# Patient Record
Sex: Male | Born: 1940 | Race: White | Hispanic: No | Marital: Married | Smoking: Never smoker
Health system: Southern US, Community
[De-identification: ages and names within clinical notes are randomized; demographics above are authoritative.]

## PROBLEM LIST (undated history)

## (undated) DIAGNOSIS — I10 Essential (primary) hypertension: Secondary | ICD-10-CM

## (undated) DIAGNOSIS — F329 Major depressive disorder, single episode, unspecified: Secondary | ICD-10-CM

## (undated) DIAGNOSIS — F32A Depression, unspecified: Secondary | ICD-10-CM

## (undated) DIAGNOSIS — K5903 Drug induced constipation: Secondary | ICD-10-CM

## (undated) DIAGNOSIS — G5600 Carpal tunnel syndrome, unspecified upper limb: Secondary | ICD-10-CM

## (undated) DIAGNOSIS — R51 Headache: Secondary | ICD-10-CM

## (undated) DIAGNOSIS — I712 Thoracic aortic aneurysm, without rupture: Secondary | ICD-10-CM

## (undated) DIAGNOSIS — M1990 Unspecified osteoarthritis, unspecified site: Secondary | ICD-10-CM

## (undated) DIAGNOSIS — M5416 Radiculopathy, lumbar region: Secondary | ICD-10-CM

## (undated) DIAGNOSIS — Z86711 Personal history of pulmonary embolism: Secondary | ICD-10-CM

## (undated) DIAGNOSIS — F419 Anxiety disorder, unspecified: Secondary | ICD-10-CM

## (undated) DIAGNOSIS — N400 Enlarged prostate without lower urinary tract symptoms: Secondary | ICD-10-CM

## (undated) DIAGNOSIS — Z9289 Personal history of other medical treatment: Secondary | ICD-10-CM

## (undated) DIAGNOSIS — B1910 Unspecified viral hepatitis B without hepatic coma: Secondary | ICD-10-CM

## (undated) DIAGNOSIS — R350 Frequency of micturition: Secondary | ICD-10-CM

## (undated) DIAGNOSIS — R0902 Hypoxemia: Secondary | ICD-10-CM

## (undated) DIAGNOSIS — K219 Gastro-esophageal reflux disease without esophagitis: Secondary | ICD-10-CM

## (undated) DIAGNOSIS — Z87442 Personal history of urinary calculi: Secondary | ICD-10-CM

## (undated) DIAGNOSIS — I480 Paroxysmal atrial fibrillation: Secondary | ICD-10-CM

## (undated) DIAGNOSIS — I2510 Atherosclerotic heart disease of native coronary artery without angina pectoris: Secondary | ICD-10-CM

## (undated) DIAGNOSIS — G4730 Sleep apnea, unspecified: Secondary | ICD-10-CM

## (undated) DIAGNOSIS — G8929 Other chronic pain: Secondary | ICD-10-CM

## (undated) HISTORY — PX: BACK SURGERY: SHX140

## (undated) HISTORY — PX: APPENDECTOMY: SHX54

---

## 1989-02-17 DIAGNOSIS — G4730 Sleep apnea, unspecified: Secondary | ICD-10-CM

## 1989-02-17 HISTORY — PX: JOINT REPLACEMENT: SHX530

## 1989-02-17 HISTORY — DX: Sleep apnea, unspecified: G47.30

## 2007-07-04 ENCOUNTER — Inpatient Hospital Stay (HOSPITAL_COMMUNITY): Admission: RE | Admit: 2007-07-04 | Discharge: 2007-07-09 | Payer: Self-pay | Admitting: Neurosurgery

## 2007-07-04 ENCOUNTER — Ambulatory Visit: Payer: Self-pay | Admitting: Pulmonary Disease

## 2007-07-08 ENCOUNTER — Ambulatory Visit: Payer: Self-pay | Admitting: Physical Medicine & Rehabilitation

## 2007-07-12 ENCOUNTER — Inpatient Hospital Stay (HOSPITAL_COMMUNITY): Admission: EM | Admit: 2007-07-12 | Discharge: 2007-07-17 | Payer: Self-pay | Admitting: Emergency Medicine

## 2007-07-26 ENCOUNTER — Ambulatory Visit: Payer: Self-pay | Admitting: Internal Medicine

## 2007-07-26 ENCOUNTER — Inpatient Hospital Stay (HOSPITAL_COMMUNITY): Admission: AD | Admit: 2007-07-26 | Discharge: 2007-08-14 | Payer: Self-pay | Admitting: Neurosurgery

## 2007-08-08 ENCOUNTER — Ambulatory Visit: Payer: Self-pay | Admitting: Physical Medicine & Rehabilitation

## 2007-08-14 ENCOUNTER — Inpatient Hospital Stay (HOSPITAL_COMMUNITY)
Admission: RE | Admit: 2007-08-14 | Discharge: 2007-08-23 | Payer: Self-pay | Admitting: Physical Medicine & Rehabilitation

## 2007-09-25 ENCOUNTER — Encounter: Admission: RE | Admit: 2007-09-25 | Discharge: 2007-09-25 | Payer: Self-pay | Admitting: Neurosurgery

## 2007-11-04 ENCOUNTER — Inpatient Hospital Stay (HOSPITAL_COMMUNITY): Admission: RE | Admit: 2007-11-04 | Discharge: 2007-11-07 | Payer: Self-pay | Admitting: Neurosurgery

## 2008-01-28 ENCOUNTER — Encounter: Admission: RE | Admit: 2008-01-28 | Discharge: 2008-01-28 | Payer: Self-pay | Admitting: Neurosurgery

## 2008-04-08 ENCOUNTER — Encounter: Admission: RE | Admit: 2008-04-08 | Discharge: 2008-04-08 | Payer: Self-pay | Admitting: Neurosurgery

## 2008-04-22 IMAGING — CR DG CHEST 1V PORT
1 series · 1 of 1 positions shown · non-contrast
Comparison: 08/03/07.

CLINICAL DATA: 66 year-old-male ? PICC line placement. 
 PORTABLE CHEST - 1 VIEW ? 08/23/07:

[view not recorded]
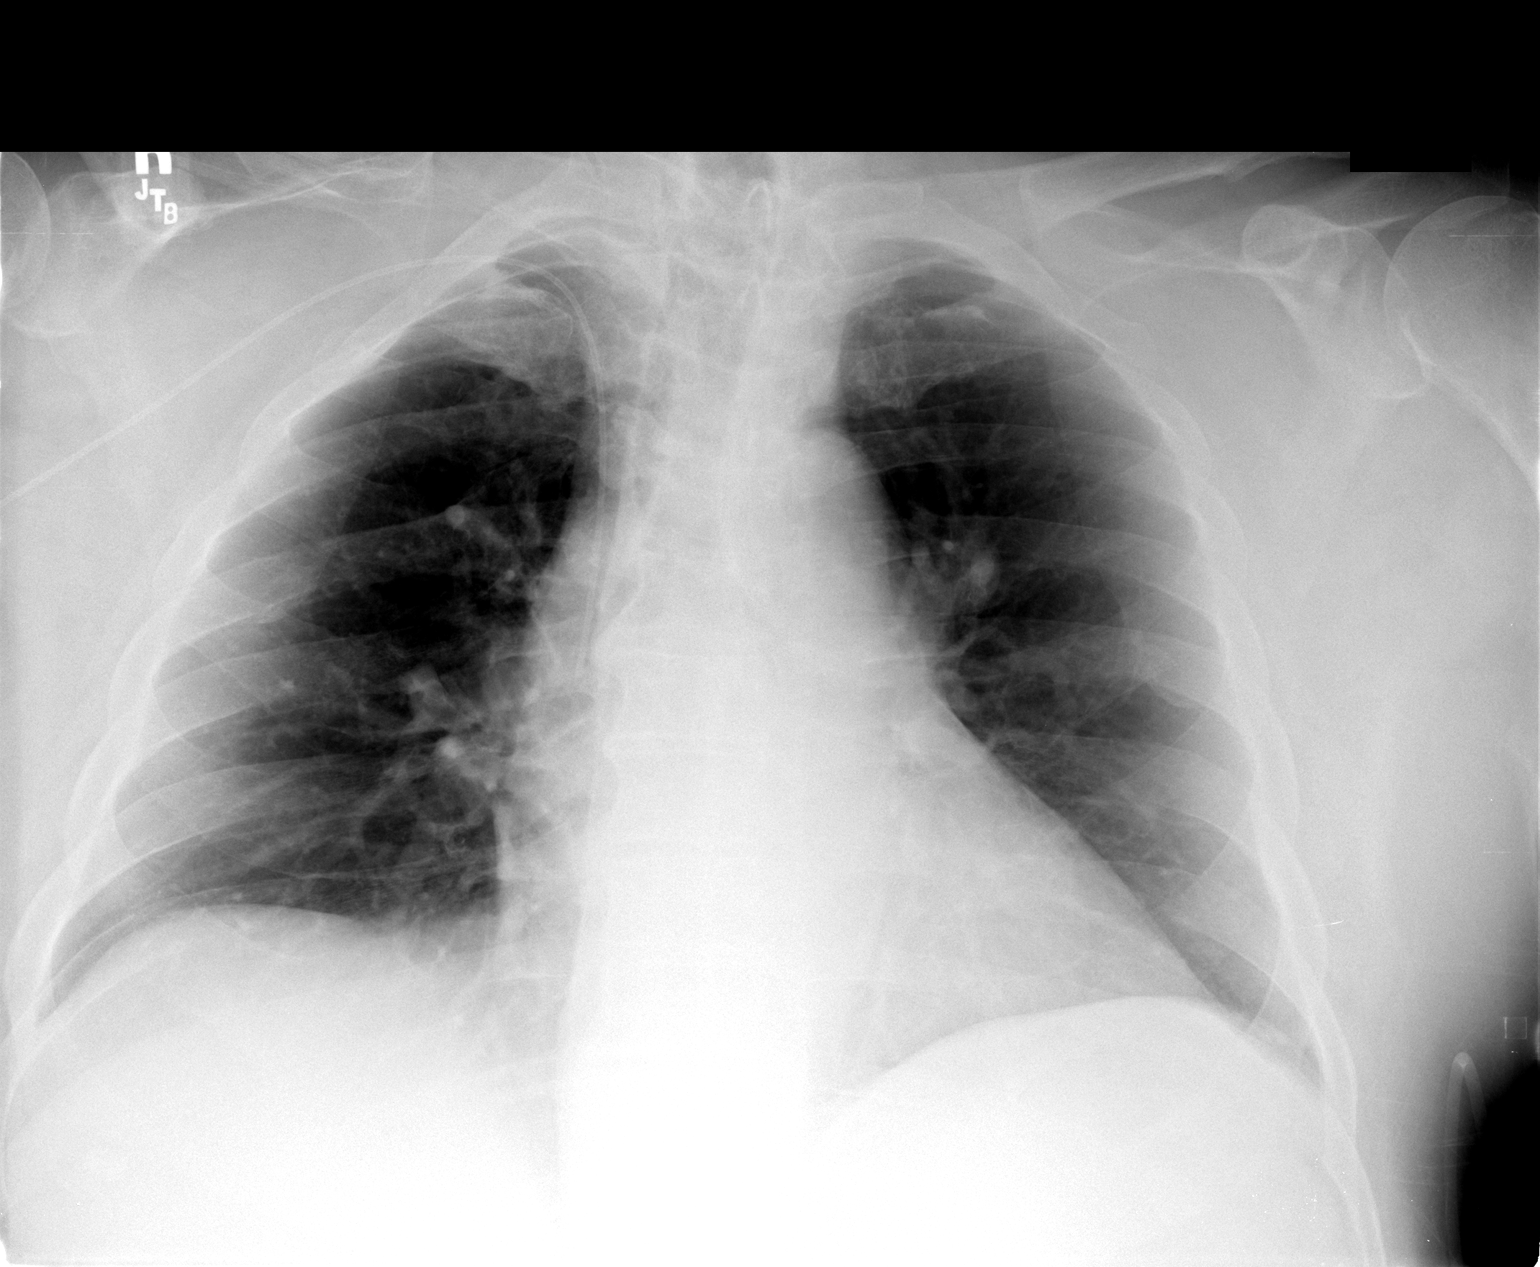

[1 of 1 positions shown; findings below may reference images not displayed]

FINDINGS: The right-sided PICC line tip is in good position in the distal SVC.  It is approximately 2.5 cm above the cavoatrial junction.  The heart is mildly enlarged.  Low lung volumes but no significant pulmonary findings.
IMPRESSION: PICC line tip is in the distal SVC approximately 2.5 cm above the region of the cavoatrial junction.

## 2008-05-25 IMAGING — CT CT L SPINE W/O CM
4 of 10 series · 12 of 33 positions shown, 14 images · non-contrast
Comparison: CT 08/05/2007 and MRI 07/29/2007.

CLINICAL DATA: Low back and bilateral leg pain.  Status post lumbar
surgery complicated by infection.

CT LUMBAR SPINE WITHOUT CONTRAST
TECHNIQUE: Multidetector CT imaging of the lumbar spine was
performed without intravenous contrast administration. Multiplanar
CT image reconstructions were also generated.

[Series 2: l spine · axial · 0.27mm/px · z∈[-276,-196]mm · 2 of 96 slices shown]
[im 32/96  bone]
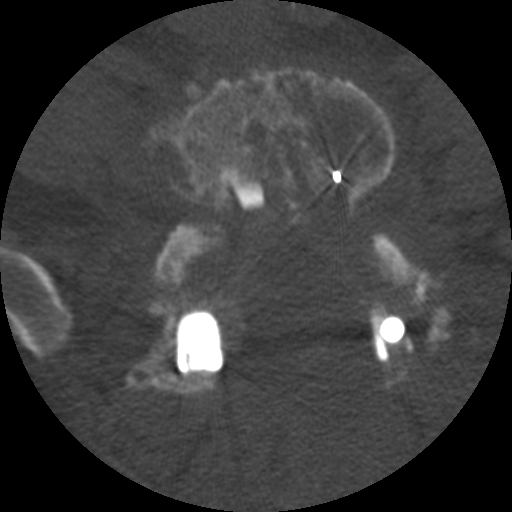
[im 64/96  bone]
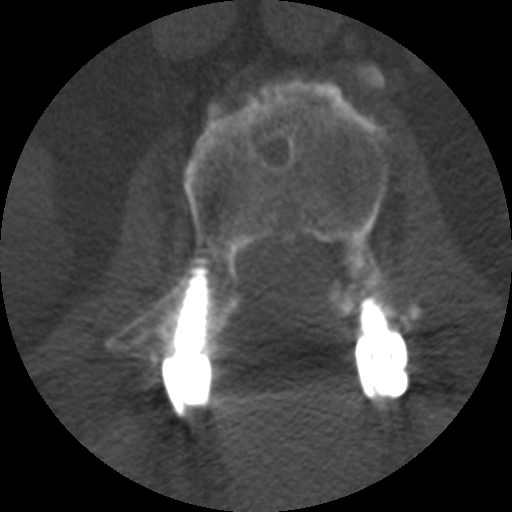

[Series 3: bone windows · axial · 0.27mm/px · z∈[-296,-176]mm · 3 of 96 slices shown, 4 images]
[im 24/96  soft-tissue]
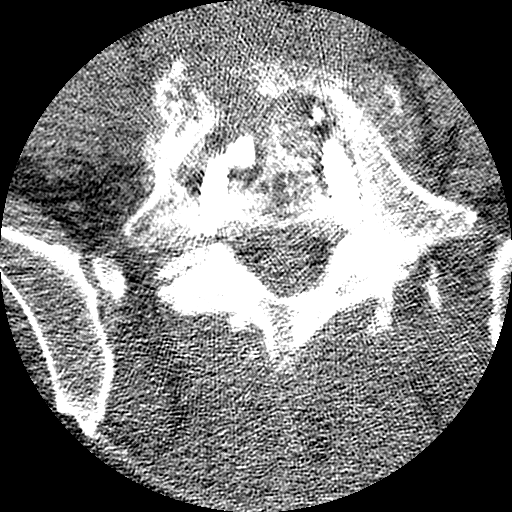
[im 24/96  bone]
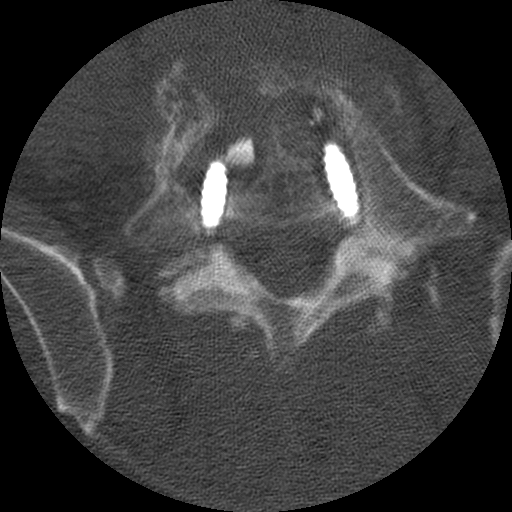
[im 48/96  bone]
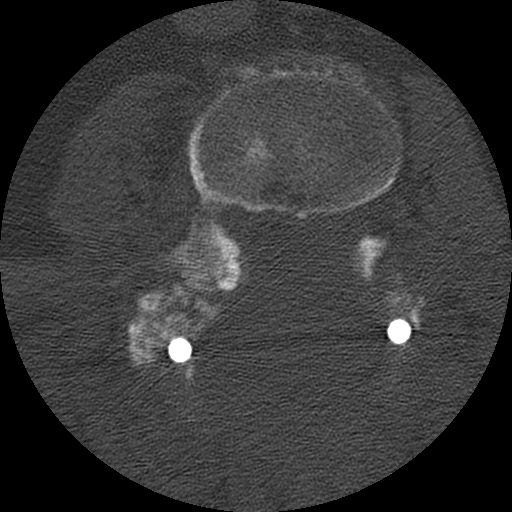
[im 72/96  bone]
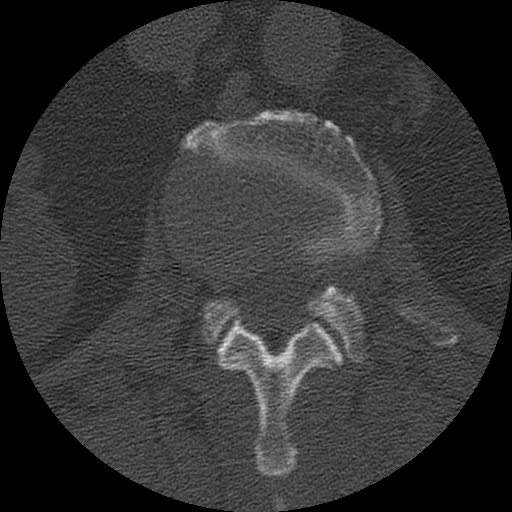

[Series 401: cor lower l-spine · coronal · 0.47mm/px · 2 of 60 slices shown]
[im 20/60  bone]
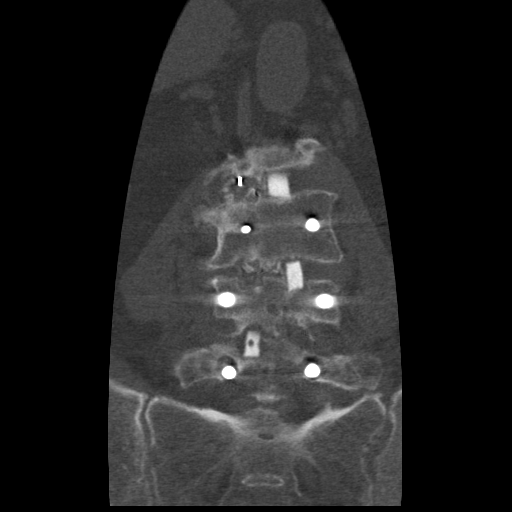
[im 40/60  bone]
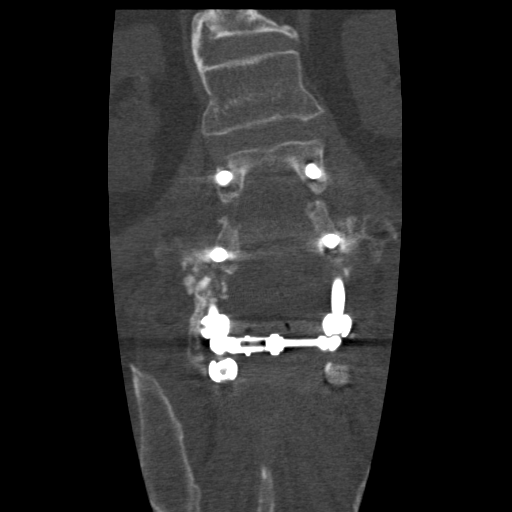

[Series 402: sag l-spine · sagittal · 0.47mm/px · 5 of 40 slices shown, 6 images]
[im 14/40  bone]
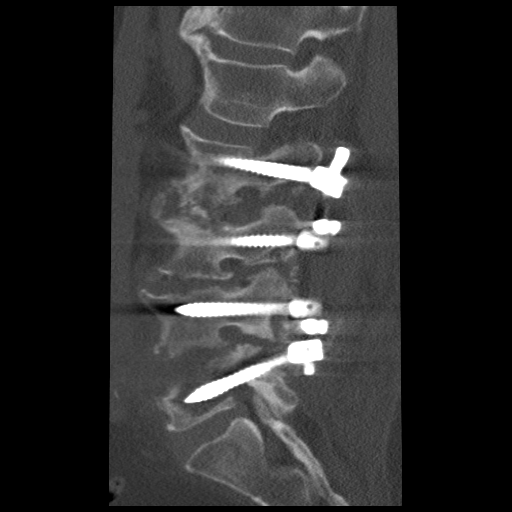
[im 17/40  bone]
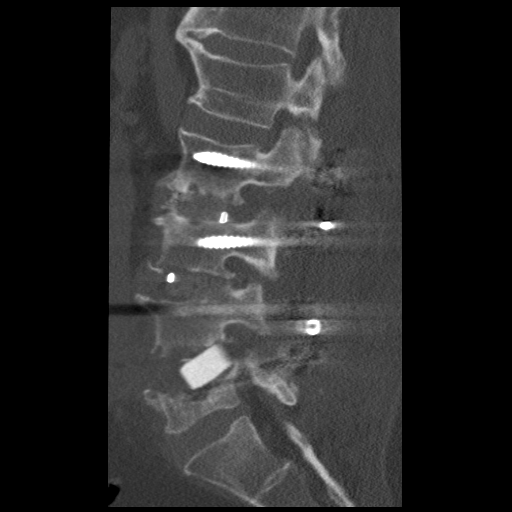
[im 20/40  soft-tissue]
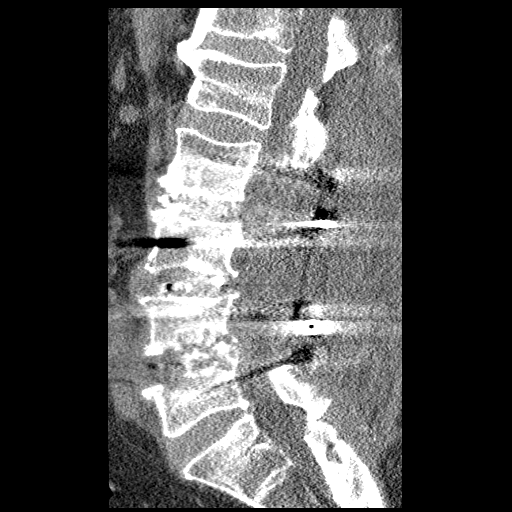
[im 20/40  bone]
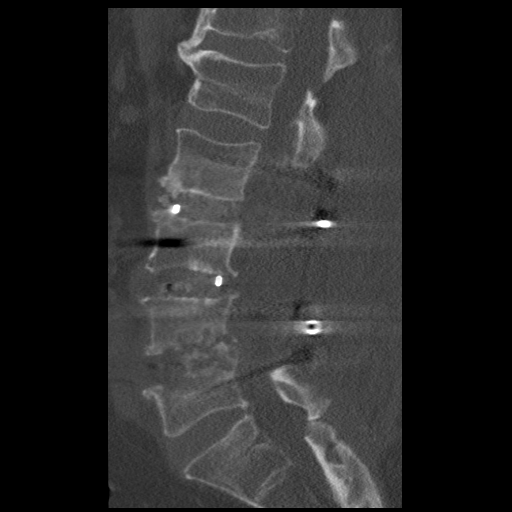
[im 23/40  bone]
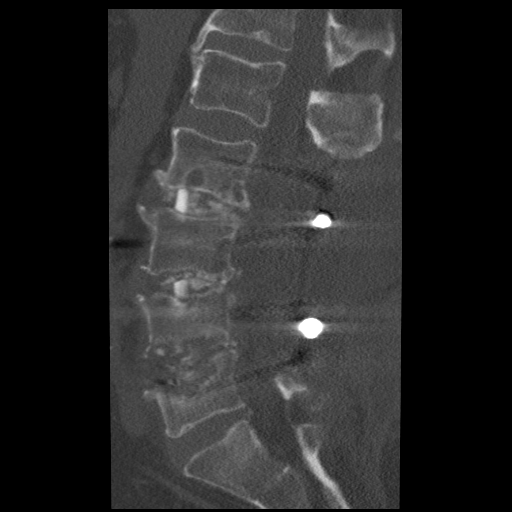
[im 27/40  bone]
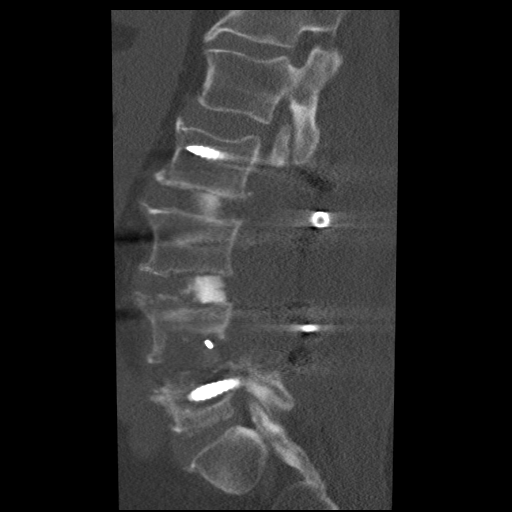

[12 of 33 positions shown; findings below may reference images not displayed]

FINDINGS: The patient is status post PLIF L2-L5.  It is difficult
to assess the extent of the posterior fluid collection on CT a
large posterior collection is still suspected.  There are locules
of air at the L4 level within the posterior collection.

There is significant progression of bone resorption and endplate
destruction at the L4-5 level.  There is subsidence of the metallic
spacer and graft material into the L5 vertebral body.  There is
marked progression of lucency surrounding the L5 pedicle screws.
There is also some increased lucency surrounding the metallic
spacer at the L3-4 level.  The remaining pedicle screws appear to
be intact.  Significant ventral soft tissue prominence is also
present at the L4-5 level and to a lesser extent at the L3-4 level.
[DATE] level is more prominent than on the prior study.

Overall alignment is stable.  The PICC individual disc levels are
as follows

L1-2:  Mild facet hypertrophy and disc bulging are redemonstrated.

L2-3:  Extensive soft tissue is seen posteriorly.  Left greater
than right foraminal narrowing is redemonstrated without change.

L3-4:  Right greater than left osteophytic spurring is seen within
the foramina.  Extensive soft tissue prominence extends into both
foramen.  Posterior fluid collection is redemonstrated.

L4-5:  As stated there is evidence for significant progression of
infection with loosening L5 pedicle screws and endplate destruction
as well as progressive subsidence.

L5-S1:  Mild disc bulging is present without significant stenosis.
IMPRESSION: 1.  Suggestion of ongoing infection and bone destruction at the L4-
5 level with increased lucency and endplate destruction surrounding
the graft material spacers.
2.  Progressive loosening of the L5 screws.
3.  Suspected infection at the L3-4 level as well with increased to
surrounding the metallic spacer in the disc space.
4.  Persistent posterior fluid collection extending to the level of
the dura with residual locules of air concerning for persistent
infection.

Critical test results telephoned to Dr. Tiger at the time of
interpretation on date 09/25/2007 at time [DATE] a.m.. These results
were repeated back to the interpreting radiologist for
verification.

## 2008-08-18 ENCOUNTER — Encounter: Admission: RE | Admit: 2008-08-18 | Discharge: 2008-08-18 | Payer: Self-pay | Admitting: Neurosurgery

## 2008-08-28 ENCOUNTER — Inpatient Hospital Stay (HOSPITAL_COMMUNITY): Admission: RE | Admit: 2008-08-28 | Discharge: 2008-08-30 | Payer: Self-pay | Admitting: Neurosurgery

## 2009-09-08 ENCOUNTER — Encounter: Admission: RE | Admit: 2009-09-08 | Discharge: 2009-09-08 | Payer: Self-pay | Admitting: Neurosurgery

## 2010-07-30 ENCOUNTER — Inpatient Hospital Stay (HOSPITAL_COMMUNITY)
Admission: EM | Admit: 2010-07-30 | Discharge: 2010-08-03 | DRG: 029 | Disposition: A | Discharge status: Home Health | Payer: Medicare Other | Attending: Neurosurgery | Admitting: Neurosurgery

## 2010-07-30 ENCOUNTER — Emergency Department (HOSPITAL_COMMUNITY): Payer: Medicare Other

## 2010-07-30 DIAGNOSIS — K219 Gastro-esophageal reflux disease without esophagitis: Secondary | ICD-10-CM | POA: Diagnosis present

## 2010-07-30 DIAGNOSIS — E876 Hypokalemia: Secondary | ICD-10-CM | POA: Diagnosis present

## 2010-07-30 DIAGNOSIS — A4901 Methicillin susceptible Staphylococcus aureus infection, unspecified site: Secondary | ICD-10-CM | POA: Diagnosis present

## 2010-07-30 DIAGNOSIS — I10 Essential (primary) hypertension: Secondary | ICD-10-CM | POA: Diagnosis present

## 2010-07-30 DIAGNOSIS — G834 Cauda equina syndrome: Principal | ICD-10-CM | POA: Diagnosis present

## 2010-07-30 DIAGNOSIS — F411 Generalized anxiety disorder: Secondary | ICD-10-CM | POA: Diagnosis present

## 2010-07-30 DIAGNOSIS — G4733 Obstructive sleep apnea (adult) (pediatric): Secondary | ICD-10-CM | POA: Diagnosis present

## 2010-07-30 DIAGNOSIS — R319 Hematuria, unspecified: Secondary | ICD-10-CM | POA: Diagnosis present

## 2010-07-30 DIAGNOSIS — M1990 Unspecified osteoarthritis, unspecified site: Secondary | ICD-10-CM | POA: Diagnosis present

## 2010-07-30 DIAGNOSIS — N390 Urinary tract infection, site not specified: Secondary | ICD-10-CM | POA: Diagnosis present

## 2010-07-30 LAB — DIFFERENTIAL
Basophils Relative: 0 % (ref 0–1)
Lymphocytes Relative: 21 % (ref 12–46)
Lymphs Abs: 1.1 10*3/uL (ref 0.7–4.0)
Monocytes Relative: 10 % (ref 3–12)
Neutro Abs: 3.8 10*3/uL (ref 1.7–7.7)
Neutrophils Relative %: 68 % (ref 43–77)

## 2010-07-30 LAB — HEPATIC FUNCTION PANEL
ALT: 18 U/L (ref 0–53)
AST: 22 U/L (ref 0–37)
Bilirubin, Direct: 0.2 mg/dL (ref 0.0–0.3)
Total Protein: 6.8 g/dL (ref 6.0–8.3)

## 2010-07-30 LAB — CBC
HCT: 35.9 % — ABNORMAL LOW (ref 39.0–52.0)
Hemoglobin: 12.6 g/dL — ABNORMAL LOW (ref 13.0–17.0)
MCH: 29.9 pg (ref 26.0–34.0)
RBC: 4.22 MIL/uL (ref 4.22–5.81)

## 2010-07-30 LAB — POCT I-STAT, CHEM 8
BUN: 15 mg/dL (ref 6–23)
Creatinine, Ser: 0.7 mg/dL (ref 0.4–1.5)
Glucose, Bld: 117 mg/dL — ABNORMAL HIGH (ref 70–99)
Sodium: 136 mEq/L (ref 135–145)
TCO2: 25 mmol/L (ref 0–100)

## 2010-07-30 LAB — LACTIC ACID, PLASMA: Lactic Acid, Venous: 1.1 mmol/L (ref 0.5–2.2)

## 2010-07-30 LAB — URINALYSIS, ROUTINE W REFLEX MICROSCOPIC
Bilirubin Urine: NEGATIVE
Ketones, ur: NEGATIVE mg/dL
Protein, ur: NEGATIVE mg/dL
Urobilinogen, UA: 1 mg/dL (ref 0.0–1.0)

## 2010-07-30 LAB — PROTIME-INR: INR: 2.06 — ABNORMAL HIGH (ref 0.00–1.49)

## 2010-07-30 MED ORDER — GADOBENATE DIMEGLUMINE 529 MG/ML IV SOLN
20.0000 mL | Freq: Once | INTRAVENOUS | Status: AC
  Administered 2010-07-30: 20 mL via INTRAVENOUS

## 2010-07-31 ENCOUNTER — Inpatient Hospital Stay (HOSPITAL_COMMUNITY): Payer: Medicare Other

## 2010-07-31 DIAGNOSIS — M549 Dorsalgia, unspecified: Secondary | ICD-10-CM

## 2010-07-31 LAB — MRSA PCR SCREENING: MRSA by PCR: NEGATIVE

## 2010-07-31 LAB — BASIC METABOLIC PANEL
BUN: 10 mg/dL (ref 6–23)
CO2: 28 mEq/L (ref 19–32)
Chloride: 103 mEq/L (ref 96–112)
Creatinine, Ser: 0.53 mg/dL (ref 0.4–1.5)
Glucose, Bld: 106 mg/dL — ABNORMAL HIGH (ref 70–99)
Potassium: 3.7 mEq/L (ref 3.5–5.1)

## 2010-07-31 LAB — MAGNESIUM: Magnesium: 2.1 mg/dL (ref 1.5–2.5)

## 2010-07-31 LAB — CBC
HCT: 37.6 % — ABNORMAL LOW (ref 39.0–52.0)
Hemoglobin: 12.8 g/dL — ABNORMAL LOW (ref 13.0–17.0)
MCH: 29.6 pg (ref 26.0–34.0)
MCHC: 34 g/dL (ref 30.0–36.0)
RDW: 13 % (ref 11.5–15.5)

## 2010-07-31 LAB — PROTIME-INR: INR: 2.59 — ABNORMAL HIGH (ref 0.00–1.49)

## 2010-08-01 ENCOUNTER — Inpatient Hospital Stay (HOSPITAL_COMMUNITY): Payer: Medicare Other

## 2010-08-01 DIAGNOSIS — M549 Dorsalgia, unspecified: Secondary | ICD-10-CM

## 2010-08-01 LAB — URINE CULTURE
Colony Count: >=100000
Culture  Setup Time: 201202111244

## 2010-08-01 LAB — PREPARE FRESH FROZEN PLASMA
Unit division: 0
Unit division: 0

## 2010-08-01 LAB — BASIC METABOLIC PANEL
GFR calc non Af Amer: >60 mL/min (ref >60)
Potassium: 3.7 mEq/L (ref 3.5–5.1)
Sodium: 139 mEq/L (ref 135–145)

## 2010-08-01 LAB — CBC
HCT: 33.6 % — ABNORMAL LOW (ref 39.0–52.0)
Hemoglobin: 11.4 g/dL — ABNORMAL LOW (ref 13.0–17.0)
RBC: 3.99 MIL/uL — ABNORMAL LOW (ref 4.22–5.81)

## 2010-08-01 LAB — PROTIME-INR
INR: 1.37 (ref 0.00–1.49)
Prothrombin Time: 17.1 seconds — ABNORMAL HIGH (ref 11.6–15.2)

## 2010-08-02 LAB — CBC
HCT: 33.5 % — ABNORMAL LOW (ref 39.0–52.0)
Hemoglobin: 11.5 g/dL — ABNORMAL LOW (ref 13.0–17.0)
RBC: 3.92 MIL/uL — ABNORMAL LOW (ref 4.22–5.81)
RDW: 12.8 % (ref 11.5–15.5)
WBC: 11 10*3/uL — ABNORMAL HIGH (ref 4.0–10.5)

## 2010-08-02 LAB — BASIC METABOLIC PANEL
Chloride: 103 mEq/L (ref 96–112)
Creatinine, Ser: 0.57 mg/dL (ref 0.4–1.5)
GFR calc Af Amer: >60 mL/min (ref >60)
Potassium: 3.9 mEq/L (ref 3.5–5.1)
Sodium: 138 mEq/L (ref 135–145)

## 2010-08-02 LAB — GLUCOSE, CAPILLARY
Glucose-Capillary: 130 mg/dL — ABNORMAL HIGH (ref 70–99)
Glucose-Capillary: 125 mg/dL — ABNORMAL HIGH (ref 70–99)

## 2010-08-02 LAB — PROTIME-INR: INR: 1.2 (ref 0.00–1.49)

## 2010-08-03 LAB — BASIC METABOLIC PANEL
CO2: 32 mEq/L (ref 19–32)
Calcium: 9.1 mg/dL (ref 8.4–10.5)
Creatinine, Ser: 0.62 mg/dL (ref 0.4–1.5)
GFR calc Af Amer: >60 mL/min (ref >60)

## 2010-08-03 LAB — CROSSMATCH

## 2010-08-03 LAB — CBC
MCH: 29 pg (ref 26.0–34.0)
MCHC: 33.6 g/dL (ref 30.0–36.0)
Platelets: 195 10*3/uL (ref 150–400)
RBC: 3.9 MIL/uL — ABNORMAL LOW (ref 4.22–5.81)

## 2010-08-05 LAB — CULTURE, BLOOD (ROUTINE X 2)
Culture  Setup Time: 201202111611
Culture: NO GROWTH
Culture: NO GROWTH

## 2010-08-09 ENCOUNTER — Other Ambulatory Visit: Payer: Self-pay | Admitting: Neurosurgery

## 2010-08-09 DIAGNOSIS — M79606 Pain in leg, unspecified: Secondary | ICD-10-CM

## 2010-08-09 DIAGNOSIS — R609 Edema, unspecified: Secondary | ICD-10-CM

## 2010-08-11 NOTE — Discharge Summary (Signed)
NAMESHOWN, DISSINGER                ACCOUNT NO.:  [id number]  MEDICAL RECORD NO.:  [id number]           PATIENT TYPE:  I  LOCATION:  3015                         FACILITY:  [facility]  PHYSICIAN:  Lacretia Leigh. Ninetta Lights, M.D.DATE OF BIRTH:  [birth year]  DATE OF ADMISSION:  07/30/2010 DATE OF DISCHARGE:  08/03/2010                              DISCHARGE SUMMARY   CONTINUITY MD:  Kathaleen Maser. Pool, MD  DISCHARGE DIAGNOSES: 1. Cauda equina syndrome secondary to disk protrusion s/p     laminectomy and microdiskectomy of L1-L2 on 08/01/10. 2. Urinary tract infection. 3. Hematuria. 4. Obstructive sleep apnea. 5. Hypertension. 6. Anxiety. 7. Esophageal reflux. 8. Osteoarthritis.  DISCHARGE MEDICATIONS WITH ACCURATE DOSES: 1. Lyrica 100 mg take one to two capsules by mouth twice daily. 2. Venlafaxine 75 mg take one tablet by mouth daily. 3. Meclizine 25 mg take one tablet by mouth four times a day as     needed. 4. Omega-3 acid 1 g take two capsules daily. 5. Alprazolam 0.05 mg take one tablet every 8 hours as needed for     anxiety. 6. Metoprolol 50 mg take one tablet by mouth twice daily. 7. Flexeril 10 mg take one tablet by mouth twice daily. 8. Tizanidine 4 mg take half to one tablets by mouth four times a day. 9. Oxycodone 5 mg take one tablet by mouth every 4 hours as needed     for pain. 10.Ultram 50 mg take one tablet by mouth four times as needed for     pain. 11.Celebrex 200 mg take one to two tablets every days with meals. 12.Calcium carbonate 1250 mg take one tablet by mouth twice daily. 13.Potassium chloride 20 mEq take one tablet by mouth twice daily. 14.Hydrochlorothiazide 50 mg take one tablet by mouth daily. 15.Vitamin D3 1000 units take one tablet by mouth daily. 16.Fentanyl patch 25 mcg per hour, change the patch every 3 days as     needed for pain.  DISPOSITION AND FOLLOWUP:  The patient was discharged from [facility] in stable and improved condition on August 03, 2010.  The patient has been scheduled to follow up with Dr. Jordan Likes on August 11, 2010 at 11:30 a.m.  This is a postoperative appointment with Dr. Jordan Likes after his laminectomy and microdiskectomy secondary to the surgery for his disk protrusion.  PROCEDURES PERFORMED: 1. Chest x-ray on July 30, 2010, that showed atelectasis versus     scarring lateral left lung base.  No acute findings. 2. MRI of spine, which showed remote fusion at L2-L5 with development     of large extruded L1-L2 cephalad extended disk protrusion and     surrounding hematoma (more notable on the left) causing marked     spinal stenosis, thecal sac narrowing (at the level of inferior     aspect of the conus and marked bilateral foraminal narrowing). 3. Ultrasound was negative for hydronephrosis.  It showed kidneys are     normal in size and texture and each contains one simple cyst.  Two     small nonobstructive stones versus vascular calcification in the  right kidneys. 4. X-ray of lumbar spine, in which parietal level of L2 was    identified.  CONSULTATIONS:  Henry A. Pool, MD with Neurosurgery.  BRIEF ADMITTING HISTORY AND PHYSICAL:  Mr. Wojtkiewicz is a 70 year old unfortunate gentleman with past medical history significant for lumbar stenosis, degenerative disk disease, multiple lumbar spine surgeries for lumbar spine fusion and then failure requiring reexploration and removal of the hardware who presented to the ER for worsening of his acute on chronic back pain and inability to walk for last 48 hours.  Mr. Galbraith was in his usual state of health until 3 years ago when he had his first surgery for lower back that was complicated with postop PE and then reexploration and paraspinal abscess that grew Pseudomonas.  In the interim, pain was well controlled with the treatment, but for the last 2- 3 months, his back pain was getting worse, and for the last 48 hours, his pain was intractable and was associated with  inability to walk. His pain was so severe in intensity that he could not lay off on his back when he presented to the ER.  He was scheduled to get an outpatient MRI on the day of his presentation.  As per the daughter and wife, he is having decreased urine output, but he is not drinking much for last few days, but they deny any associated chills, night sweats, nausea, vomiting, diarrhea.  VITAL SIGNS:  On admission, temperature 90.8, blood pressure 139/65, pulse 79, respiratory rate 18, O2 sats 97% on room air. PHYSICAL EXAMINATION:  GENERAL:  Alert, sedated, under effects of meds. HEENT : Pupils are equal, round, and reactive to light,Extraocular movements intact. ENT:  Mucous membranes moist and pink. NECK:  Supple.  No lymphadenopathy. RESPIRATORY:  Clear to auscultation bilaterally. CVS:  Regular rate and rhythm.  S1-S2 normal. GI:  Soft, positive bowel sounds, nontender, and no hepatosplenomegaly. EXTREMITIES:  No edema. MUSCULOSKELETAL:  Could not be assessed because the patient was sedated. NEUROLOGIC:  Could not be assessed the same day, but next day, his strength was 4/5 in the left lower extremity and 2/5 in the right lower extremity.  His sensation was decreased to light touch on the right lower extremity, but still neuro exam could not be 100% certain because the patient was in lot of pain.  ADMITTING LABS:  His hemoglobin 12.6, hematocrit 35.9, white cell count 5.5, platelets 193.  Sodium 136, potassium 2.9, chloride 100, BUN 15, creatinine 0.7.  His UA was positive for nitrites and large leukocytes and had too numerous wbc's to count.  HOSPITAL COURSE BY PROBLEM: 1.  Cauda Equina Syndrome secondary to disk protrusion.  The     patient presented with worsening of acute on chronic back pain.  He     had an MRI under anesthesia because the patient was in severe     pain.  The MRI showed a questionable hematoma likely     secondary to disk protrusion that was  compressing his cord     associated marked spinal stenosis.  His neurosurgeon, Dr. Jordan Likes who     was immediately consulted.  The patient was also noted     to have urinary tract infection on his presentation, so Dr. Jordan Likes     suggested that he would take him to OR after treating his urinary     tract infection for 2 days. For those two days, the patient was started     on dexamethasone and his pain  was controlled with Dilaudid,      fentanyl patch and his agitation was controlled with Ativan and Haldol.      The patient underwent microdiskectomy and laminectomy of L1-L2      on August 01, 2010. He tolerated the procedure very well The     patient had uncomplicated postop course.  Postop, his pain was well      controlled with Dilaudid.  As per Dr. Lindalou Hose recommendation and because he was     medically stable, we discharged him on August 03, 2010. 2. Urinary tract infection.  The patient had a UA which was positive     for large leukocytes and positive nitrites.  His microscopy showed     too numerous to count white cells.  His urine culture grew 100,000     colonies of Staph aureus.  The patient was treated with 3 days of     IV vancomycin and ceftazidime.  Given his history of Pseudomonas in     the past, it was decided to continue the patient on vanc and ceftazidime.     Postop, we de-escalated the antibiotic therapy and switched the     patient to Ancef.  At the time of discharge, the patient was     switched to p.o. Keflex to complete a total of 7 days of     antibiotics for his urinary tract infection. 3. Coagulopathy.  The patient was taking Coumadin secondary to     pulmonary embolism that occurred to him after his first surgery.     His INR on presentation was 2.59  his Coumadin was held.      Before his surgery, he also received 4 units of FFP and 10 units of     vitamin K.  His Coumadin was held at the time of discharge because     we believe that he does not require a  long-term Coumadin for     provoked episode of pulmonary embolism .  DISCHARGE LABS AND VITALS:  His discharge vitals include temperature 98.8, pulse 66, respiratory rate 24, blood pressure 128/69, O2 sats 98% on room air.   Discharge labs:hemoglobin 11.3, hematocrit 33.6, white cell count 6, platelets 195.  Sodium 138, potassium 3.6, chloride 101, BUN 10, creatinine 0.62, glucose 113.    ______________________________ Elyse Jarvis, MD   ______________________________ Lacretia Leigh. Ninetta Lights, M.D.    MS/MEDQ  D:  08/03/2010  T:  08/04/2010  Job:  [id number]  Electronically Signed by Elyse Jarvis MD on 08/07/2010 03:23:20 PM Electronically Signed by Johny Sax M.D. on 08/11/2010 05:11:38 PM

## 2010-08-15 ENCOUNTER — Ambulatory Visit
Admission: RE | Admit: 2010-08-15 | Discharge: 2010-08-15 | Disposition: A | Discharge status: Home / Self Care | Payer: Medicare Other | Source: Ambulatory Visit | Attending: Neurosurgery | Admitting: Neurosurgery

## 2010-08-15 DIAGNOSIS — M79606 Pain in leg, unspecified: Secondary | ICD-10-CM

## 2010-08-15 DIAGNOSIS — R609 Edema, unspecified: Secondary | ICD-10-CM

## 2010-08-25 NOTE — Op Note (Signed)
[name], [name]                ACCOUNT NO.:  [id number]  MEDICAL RECORD NO.:  [id number]           PATIENT TYPE:  E  LOCATION:  [facility]                         FACILITY:  [facility]  PHYSICIAN:  Laysha Childers A. Rossi Burdo, M.D.    DATE OF BIRTH:  05/23/1941  DATE OF PROCEDURE:  08/01/2010 DATE OF DISCHARGE:                              OPERATIVE REPORT   SERVICE:  Neurosurgery.  PREOPERATIVE DIAGNOSIS:  L1-L2 herniated pulposus with cauda equina syndrome.  POSTOPERATIVE DIAGNOSIS:  L1-L2 herniated pulposus with cauda equina syndrome.  PROCEDURE:  Left L1-L2 redo decompressive laminotomy with left L1-L2 microdiskectomy.  SURGEON:  [name]. Nicklaus Alviar, MD.  ASSISTANT:  Donalee Citrin, MD.  ANESTHESIA:  General endotracheal.  INDICATION:  [name] is a 70 year old male, who is status post previous L2 to L5 fusion complicated by postoperative hardware complications and infection.  The patient has subsequently achieved solid fusion with good overall pain relief.  He presents now with a history of severe back pain, starting on Thursday with radiation of both lower extremities, left greater than right.  Workup demonstrates a very large L1-L2 disk herniation with marked thecal sac and nerve root compression.  Also, workup has demonstrated a probable urinary tract infection.  The patient is currently undergoing antibiotic therapy and has had his Coumadin reversed.  He presents now for laminotomy and microdiskectomy in hopes of improving his symptoms.  OPERATION:  The patient was brought to the operating room and placed on operating table in supine position.  After adequate level of anesthesia was achieved, the patient was placed prone onto Wilson frame, appropriately padded the patient's lumbar regions and prepped and draped sterilely.  A #10 blade was used to make a curvilinear skin incision overlying the L1-L2 interspace.  This was carried down sharply in the midline.  Subperiosteal dissection was  performed exposing the lamina and facet joints of L1 and L2 on the left side.  Deep self-retaining retractor was placed.  Intraoperative x-rays taken and the level was confirmed.  Laminotomy was then performed using high-speed drill and Kerrison rongeurs through the inferior aspect of the lamina of L1, the medial aspect of the L1-L2 facet joint and the superior rim of the L2 lamina and epidural scar.  Ligamentum flavum and epidural scar were then further resected.  Underlying thecal sac and exiting L2 nerve root were identified.  Microscope was brought to the microdissection of the spinal canal and disk herniation.  Epidural venous plexus was coagulated and cut.  A large amount of free disk herniation was then encountered and resected in a piecemeal fashion using a blunt nerve hooks and pituitary rongeurs.  All elements of disk herniation were resected.  Disk space was then incised with a #15 blade in a rectangular fashion.  Wide disk space clean out was then achieved using pituitary rongeurs, upbiting pituitary rongeurs, and Epstein curettes.  All elements of disk herniation were completely resected.  All loose or obvious degenerative disk material was then removed from the interspace.  At this point, a very thorough diskectomy had been achieved.  There was no injury to thecal sac or nerve  root.  The wound was then irrigated with antibiotic solution.  Gelfoam was placed topically for hemostasis and found to be good.  Microscope and retractor system were removed.  Hemostasis was then achieve with electrocautery. The wound was closed in layers with Vicryl suture.  Steri-Strips and sterile dressings were applied.  There were no complications.  The patient tolerated the procedure well and he returned to the recovery room postoperatively.          ______________________________ [name] [name], M.D.     HAP/MEDQ  D:  08/01/2010  T:  08/02/2010  Job:  [id number]  Electronically Signed by  Julio Sicks M.D. on 08/24/2010 11:21:40 PM

## 2010-09-29 LAB — POCT I-STAT EG7
Calcium, Ion: 1.28 mmol/L (ref 1.12–1.32)
HCT: 34 % — ABNORMAL LOW (ref 39.0–52.0)
Hemoglobin: 11.6 g/dL — ABNORMAL LOW (ref 13.0–17.0)
Patient temperature: 37
Potassium: 4.1 mEq/L (ref 3.5–5.1)
Sodium: 137 mEq/L (ref 135–145)
pCO2, Ven: 44.7 mmHg — ABNORMAL LOW (ref 45.0–50.0)
pH, Ven: 7.396 — ABNORMAL HIGH (ref 7.250–7.300)

## 2010-09-29 LAB — CBC
Hemoglobin: 10.4 g/dL — ABNORMAL LOW (ref 13.0–17.0)
MCHC: 33.9 g/dL (ref 30.0–36.0)
Platelets: 189 10*3/uL (ref 150–400)
Platelets: 203 10*3/uL (ref 150–400)
RBC: 4.82 MIL/uL (ref 4.22–5.81)
RDW: 15.8 % — ABNORMAL HIGH (ref 11.5–15.5)
WBC: 4.4 10*3/uL (ref 4.0–10.5)

## 2010-09-29 LAB — APTT: aPTT: 28 seconds (ref 24–37)

## 2010-09-29 LAB — HEPATIC FUNCTION PANEL
ALT: 18 U/L (ref 0–53)
AST: 26 U/L (ref 0–37)
Albumin: 3.8 g/dL (ref 3.5–5.2)
Alkaline Phosphatase: 109 U/L (ref 39–117)
Total Protein: 6.8 g/dL (ref 6.0–8.3)

## 2010-09-29 LAB — TYPE AND SCREEN: Antibody Screen: NEGATIVE

## 2010-09-29 LAB — BASIC METABOLIC PANEL
BUN: 18 mg/dL (ref 6–23)
CO2: 28 mEq/L (ref 19–32)
Calcium: 8.6 mg/dL (ref 8.4–10.5)
Calcium: 9.4 mg/dL (ref 8.4–10.5)
Chloride: 101 mEq/L (ref 96–112)
Creatinine, Ser: 0.69 mg/dL (ref 0.4–1.5)
GFR calc Af Amer: >60 mL/min (ref >60)
GFR calc non Af Amer: >60 mL/min (ref >60)
Glucose, Bld: 145 mg/dL — ABNORMAL HIGH (ref 70–99)
Sodium: 138 mEq/L (ref 135–145)

## 2010-09-29 LAB — PROTIME-INR: INR: 1.2 (ref 0.00–1.49)

## 2010-10-24 ENCOUNTER — Other Ambulatory Visit (HOSPITAL_COMMUNITY): Payer: Self-pay | Admitting: Neurosurgery

## 2010-10-24 ENCOUNTER — Encounter (HOSPITAL_COMMUNITY)
Admission: RE | Admit: 2010-10-24 | Discharge: 2010-10-24 | Disposition: A | Discharge status: Home / Self Care | Payer: Medicare Other | Source: Ambulatory Visit | Attending: Neurosurgery | Admitting: Neurosurgery

## 2010-10-24 DIAGNOSIS — Z01811 Encounter for preprocedural respiratory examination: Secondary | ICD-10-CM

## 2010-10-24 LAB — CBC
MCH: 29.1 pg (ref 26.0–34.0)
MCV: 85.8 fL (ref 78.0–100.0)
Platelets: 197 10*3/uL (ref 150–400)
RDW: 12.8 % (ref 11.5–15.5)
WBC: 6.4 10*3/uL (ref 4.0–10.5)

## 2010-10-24 LAB — BASIC METABOLIC PANEL
BUN: 20 mg/dL (ref 6–23)
CO2: 32 mEq/L (ref 19–32)
Chloride: 97 mEq/L (ref 96–112)
Creatinine, Ser: 0.83 mg/dL (ref 0.4–1.5)

## 2010-10-24 LAB — DIFFERENTIAL
Basophils Relative: 1 % (ref 0–1)
Eosinophils Absolute: 0.1 10*3/uL (ref 0.0–0.7)
Eosinophils Relative: 2 % (ref 0–5)
Lymphs Abs: 1.5 10*3/uL (ref 0.7–4.0)
Monocytes Relative: 11 % (ref 3–12)

## 2010-10-25 ENCOUNTER — Inpatient Hospital Stay (HOSPITAL_COMMUNITY): Payer: Medicare Other

## 2010-10-25 ENCOUNTER — Inpatient Hospital Stay (HOSPITAL_COMMUNITY)
Admission: RE | Admit: 2010-10-25 | Discharge: 2010-10-31 | DRG: 460 | Disposition: A | Discharge status: Home Health | Payer: Medicare Other | Source: Ambulatory Visit | Attending: Neurosurgery | Admitting: Neurosurgery

## 2010-10-25 DIAGNOSIS — J4489 Other specified chronic obstructive pulmonary disease: Secondary | ICD-10-CM | POA: Diagnosis present

## 2010-10-25 DIAGNOSIS — I10 Essential (primary) hypertension: Secondary | ICD-10-CM | POA: Diagnosis present

## 2010-10-25 DIAGNOSIS — M48061 Spinal stenosis, lumbar region without neurogenic claudication: Principal | ICD-10-CM | POA: Diagnosis present

## 2010-10-25 DIAGNOSIS — J449 Chronic obstructive pulmonary disease, unspecified: Secondary | ICD-10-CM | POA: Diagnosis present

## 2010-10-25 DIAGNOSIS — F17200 Nicotine dependence, unspecified, uncomplicated: Secondary | ICD-10-CM | POA: Diagnosis present

## 2010-10-25 LAB — CBC
HCT: 34.5 % — ABNORMAL LOW (ref 39.0–52.0)
Hemoglobin: 11.6 g/dL — ABNORMAL LOW (ref 13.0–17.0)
MCH: 28.9 pg (ref 26.0–34.0)
MCHC: 33.6 g/dL (ref 30.0–36.0)
MCV: 85.8 fL (ref 78.0–100.0)

## 2010-10-26 LAB — CBC
MCH: 29.4 pg (ref 26.0–34.0)
MCV: 86.1 fL (ref 78.0–100.0)
Platelets: 162 10*3/uL (ref 150–400)
RDW: 12.8 % (ref 11.5–15.5)

## 2010-10-31 ENCOUNTER — Other Ambulatory Visit (HOSPITAL_COMMUNITY): Payer: Medicare Other

## 2010-11-01 NOTE — H&P (Signed)
[name], [name] NO.:  [id number]   MEDICAL RECORD NO.:  [id number]          PATIENT TYPE:  INP   LOCATION:  1845                         FACILITY:  [facility]   PHYSICIAN:  Tia Alert, MD     DATE OF BIRTH:  19-May-1941   DATE OF ADMISSION:  07/12/2007  DATE OF DISCHARGE:                              HISTORY & PHYSICAL   CHIEF COMPLAINT:  Left leg pain.   HISTORY OF PRESENT ILLNESS:  [name] is a 70 year old gentleman who is  well-known to our practice who is status post multilevel lumbar  decompression instrumented fusion by Dr. Altamease Oiler last week who was  recently discharged from the hospital about 3 days ago.  He presented to  the emergency department around 3 a.m. with severe back and left leg  pain that he describes as both burning and aching in character.  He  denies any bowel and bladder difficulties but is complaining of very  severe pain in that leg.  He is on Percocet, Celebrex, Dilaudid and  Lyrica and amitriptyline at home for pain control.  The emergency  department physician felt that his wound looked somewhat infected as he  had some drainage from his wound and he gave him a dose of vancomycin  and gentamicin in the emergency department and neurosurgical evaluation  was requested.   PAST MEDICAL HISTORY:  1. Includes hypertension.  2. Anxiety.  3. BPH.  4. Gastroesophageal reflux disease.  5. Osteoarthritis.  6. Sleep apnea.  7. Appendectomy.  8. Clavicle surgery.  9. He has had a previous lumbar laminectomy at L2-3 followed by this      recent lumbar fusion at L2-3, L3-4, L4-5.   MEDICATIONS:  Percocet, Keflex, Valium, Prilosec, Lexapro, Celebrex,  hydrochlorothiazide, metoprolol, potassium chloride, meclizine, Flomax,  alprazolam, amitriptyline, Dilaudid and Lyrica.   ALLERGIES:  NEURONTIN.   SOCIAL HISTORY:  Denies use of tobacco or alcohol products.   PHYSICAL EXAM:  VITAL SIGNS: Temperature 99.6 is T-max, pulse is  anywhere from 93-103, respirations 16-22.  GENERAL:  Cooperative white male who is lying in a stretcher in obvious  discomfort.  HEENT:  Normocephalic, atraumatic.  NECK:  Supple.  HEART:  Regular rate and rhythm.  EXTREMITIES:  No obvious deformities.  NEUROLOGICAL EXAM:  He is awake and alert and interactive, no aphasia.  Good attention span.  No facial asymmetry.  His strength in his dorsi  and plantar flexors seems to be 5/5 with fairly good sensation.  Reflexes are hypoactive.  Gait is not tested.   IMAGING STUDIES:  CT scan I have reviewed.  There is no obvious evidence  of hardware failure.  The interbody grafts at L2-L3 and L3-4 look to be  in good position.  However, the interbody graft on the left at L4-5  appears to be displaced posteriorly by a few mm and is potentially  causing compression of the left L5 nerve root.   ASSESSMENT AND PLAN:  [name] is a 70 year old gentleman who is status  post 3-level posterior lumbar interbody fusion with segmental  instrumentation just last  week who presents with severe back and left  leg pain.  His CT scan shows posterior migration of the left L4-5  interbody cage and this is often suggestive of early hardware failure.  I do not see evidence of wound infection on exam.  There is some  serosanguineous drainage from the wound with a mild wound dehiscence at  the superior edge, but I see no exudate, no purulence, no obvious  redness, induration or significant tenderness, and he has been afebrile.  We will check a CBC, a BMP and a PT/PTT.  We will make him n.p.o.  I  have talked to Dr. Jordan Likes on the phone and explained to him the findings  of the CT scan and he will be evaluating the patient, but he likely will  need a lumbar re-exploration with exploration of the fusion and the  hardware, replacement of the posteriorly migrated cage with possible  hardware revision if it has had early failure.  This has been explained  briefly to the  wife.  Questions have been encouraged and answered.      Tia Alert, MD  Electronically Signed     DSJ/MEDQ  D:  07/12/2007  T:  07/12/2007  Job:  [id number]

## 2010-11-01 NOTE — Op Note (Signed)
NAMECELEDONIO, SORTINO                ACCOUNT NO.:  [id number]   MEDICAL RECORD NO.:  [id number]          PATIENT TYPE:  INP   LOCATION:  3172                         FACILITY:  [facility]   PHYSICIAN:  Kathaleen Maser. Pool, M.D.    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   PROCEDURE NOTE:  Redo L2-3 decompressive laminectomy with bilateral L2-  L3 foraminotomies, more than would required for simple interbody fusion  alone.  L3-4, L4-5 decompressive laminectomies with decompressive  foraminotomies, more than would be required for simple interbody fusion  alone.  L2-3, L3-4, L4-5 posterior lumbar interbody fusion utilizing  tangent interbody allograft wedge, Telamon interbody PEEK cage and local  autografting.  L2, L3, L4 and L5 posterolateral arthrodesis utilizing  segmental pedicle screw instrumentation and local autograft.   SURGEON:  Kathaleen Maser. Pool, M.D.   ASSISTANT:  Tia Alert, MD   ANESTHESIA:  General endotracheal.   INDICATIONS:  Mr. Desroches is a 70 year old male with history of severe  back and bilateral lower extremity pain failing all conservative  management.  The patient is status post previous bilateral L2-3  laminotomy with microdiskectomy which has not improved his symptoms.  Workup has demonstrated evidence of critical stenosis at L3-4 and L4-5  and evidence of post laminectomy instability at L2-3.  We have discussed  options available for management including possibly undergoing L2-3, L3-  4 and L4-5 decompression and fusion with instrumentation.  Patient is  aware of the risks and benefits and wishes to proceed.   OPERATIVE NOTE:  Patient brought to the operating room,  table in supine  position.  Adequate level of anesthesia achieved, the patient positioned  prone onto Wilson frame and appropriate padded.  The patient's lumbar  region prepped and draped sterilely.  10 blade made linear skin incision  overlying the L2, L3, L4 and L5 levels.  This carried sharply in  midline.  Subperiosteal dissection then performed exposing lamina facet  joints of L2, L3, L4 and L5 as well as the aforementioned transverse  processes bilaterally.  Deep self-retaining retractor placed.  Intraoperative fluoroscopy used,  levels were confirmed.  Previous  laminotomy sites at L2-3 dissected free.  Complete laminectomies were  then performed at L2, L3, L4.  Partial superior laminectomy of L5 was  performed.  Inferior facetectomies L2, L3 and L4 were performed  bilaterally.  Superior facetectomies of L3, L4 and L5 were performed  bilaterally.  All bone was cleaned and used in later autografting.  Ligament flavum and epidural scar were then elevated and resected  piecemeal fashion using Kerrison rongeurs.  Underlying thecal sac and  exiting L2, L3, L4 and L5 nerve roots were identified.  Wide  decompressive foraminotomies then performed along course exiting nerve  roots.  Epidural venous plexus coagulated cut.  Starting first the  patient's left side at L2-3 thecal sac and nerve root gently mobilized,  retracted towards midline.  Disk space incised 15 blade rectangular  fashion.  Wide disk space clean-out was achieved using pituitary  rongeurs, upward angle pituitary rongeurs and Epstein curettes.  The  procedure then repeated on the contralateral side, dissecting free the  epidural scar that was more present on this side.  Disk space was then  sequentially dilated up to 11 mm with 11 mL distractor left in the  patient's left side.  Thecal sac nerve roots protected on the left.  Distractors left on the right side.  Thecal sac and nerve roots  protected on the left side.  Disk  space then reamed and cut with 10 mm  tangent instruments.  Soft tissues then removed from interspace.  A 10 x  26 mm tangent wedge was then packed into place recessed roughly 2 mm  posterior cortical margin.  The distractor was removed the patient's  right side.  Thecal sac nerve roots protected on the right side.  Disk  space once again reamed and cut with 10 mm tangent instruments.  Soft  tissues removed from the interspace.  Morselized autograft mixed with a  Progenics putty were then packed into the interspace.  A 10 x 26 mm  Telamon cage packed with morselized autograft was then packed into place  and recessed approximately 2 mm posterior cortical margin of L2.  The  procedure then repeated at L3-4 and L4-5.  At L4 12 mm implants were  used rather than 10 mm implants.  Pedicles of L2, L3, L4 and L5  identified using surface landmarks and intraoperative fluoroscopy.  Superficial bone around the pedicle was then removed using high-speed  drill.  Pedicles then probed using pedicle awl.  Each pedicle awl track  was tapped with __________  screw tap hole.  Each screw tap hole was  probed and found be solid within bone.  6.75 x 55 mm radius screws  placed bilaterally at L2, 6.75 x 50 mm screws placed bilaterally at L3  and L4 and 6.75 x 45 liters screws placed bilaterally at L5.  Transverse  processes L2, L3, L4 and L5 then decorticated bilaterally using high-  speed drill.  Morselized autograft packed posterolaterally for later  fusion.  Short segment titanium rods then placed in the screw heads of  L2-L5.  Locking caps then placed over screw heads.  Locking caps then  engaged construct under compression.  Final images revealed good  position of bone grafts hardware at proper operative level, normal  alignment of spine.  Transverse connector was placed.  Gelfoam was  placed topically for hemostasis which was found to be good.  Medium  Hemovac drains left epidural space.  Wound was then  closed in layers  with Vicryl sutures.  Steri-Strips sterile dressing were applied.  There  no complications.  The patient tolerated procedure well and she returns  recovery room postoperatively.           ______________________________  Kathaleen Maser Pool, M.D.     HAP/MEDQ  D:  07/04/2007  T:  07/05/2007  Job:  [id number]

## 2010-11-01 NOTE — Op Note (Signed)
NAMEDENARIUS, [name]                ACCOUNT NO.:  [id number]   MEDICAL RECORD NO.:  [id number]          PATIENT TYPE:  INP   LOCATION:  3306                         FACILITY:  [facility]   PHYSICIAN:  Kathaleen Maser. Pool, M.D.    DATE OF BIRTH:  1940/08/05   DATE OF PROCEDURE:  07/30/2007  DATE OF DISCHARGE:                               OPERATIVE REPORT   PREOPERATIVE DIAGNOSIS:  Deep lumbar wound infection.   POSTOPERATIVE DIAGNOSIS:  Deep lumbar wound infection.   PROCEDURE NOTE:  Incision and debridement of lumbar wound.   SURGEON:  Kathaleen Maser. Pool, M.D.   ANESTHESIA:  Local lidocaine with IV sedation.   INDICATIONS:  [name] is a 70 year old male who is status post three  level lumbar decompression and fusion with a complicated postoperative  course.  The patient initially did reasonably well, was discharged home,  and then suffered hardware failure at the L5 level with subsequent  migration of one of his interbody grafts.  The patient was taken back to  the operating room for revision.  Postoperatively, the patient's back  and lower extremity pain were much improved.  Approximately nine days  ago, the patient was admitted to [facility] with acute chest  pain and shortness of breath.  Workup was consistent with pulmonary  embolus from a deep venous thrombosis.  The patient was started on  anticoagulation.  The patient's course deteriorated while in [facility]  with evidence of increasing erythema and tenderness around his wound.  The patient has subsequently undergone workup including wound aspiration  which is now growing Pseudomonas  aeruginosa.  The patient has had a  recent MRI scan which does demonstrate a deep wound space infection with  compression upon the thecal sac and nerve roots.  I have discussed the  situation with the patient and his family.  Given his overall tenuous  medical condition, we decided to proceed with irrigation and debridement  of the lumbar wound  under local anesthesia.  The patient is aware of the  risks and benefits as is his family and they wish to proceed.   OPERATIVE NOTE:  The patient was taken to the operating room and placed  on the operating table in the left lateral decubitus position.  The  patient was then prepped and draped sterilely.  The patient's lumbar  wound is reopened at its base.  A superficial wound infection is  encountered.  Cultures were not taken secondary to the prior growth of  Pseudomonas with his cultures.  The superficial compartment is irrigated  and debrided.  The deep compartment is then entered, again, releasing  fluid under pressure.  A large amount of fluid is evacuated.  The thecal  sac and nerve roots were identified and debrided.  There is no evidence  of any active neural compression ongoing.  The fusion appears to be  solid.  The interbody grafts at L4-L5 were inspected and found to be  solid.  The patient's wound was then irrigated with saline solution and  then subsequently irrigated using Pulsavac using Kanamycin solution.  The  lumbodorsal fascia was then reapproximated with 0 Vicryl suture  after a medium Hemovac drain had  been placed in the epidural space.  The skin is reapproximated with #1  Prolene suture in an interrupted vertical mattress fashion.  A sterile  dressing was applied.  There were no complications.  The patient  tolerated the procedure well and he returns to the recovery room.           ______________________________  Kathaleen Maser Pool, M.D.     HAP/MEDQ  D:  07/30/2007  T:  07/31/2007  Job:  [id number]

## 2010-11-01 NOTE — Op Note (Signed)
[name], [name]                ACCOUNT NO.:  [id number]   MEDICAL RECORD NO.:  [id number]          PATIENT TYPE:  INP   LOCATION:  3040                         FACILITY:  [facility]   PHYSICIAN:  Kathaleen Maser. Pool, M.D.    DATE OF BIRTH:  01-10-1941   DATE OF PROCEDURE:  08/28/2008  DATE OF DISCHARGE:                               OPERATIVE REPORT   PREOPERATIVE DIAGNOSES:  Status post L2-S1 posterolateral and interbody  arthrodesis with solid fusion from L2-L5 but with pseudoarthrosis at L5-  S1.   POSTOPERATIVE DIAGNOSES:  Status post L2-S1 posterolateral and interbody  arthrodesis with solid fusion from L2-L5 but with pseudoarthrosis at L5-  S1.   PROCEDURE NOTE:  Re-exploration of lumbar fusion from L2-S1 with removal  of L2 through S1 segmental instrumentation.   SURGEON:  Kathaleen Maser. Pool, MD   ANESTHESIA:  General endotracheal.   INDICATIONS:  Mr. Flenner is a 70 year old male has a complicated lumbar  history.  He is initially status post previous L2-L5 decompression and  fusion.  This was complicated by L5 fracture.  This fusion was revised.  That fusion incorporated the L5-S1 level in a posterolateral method to  hopefully stabilize the L4-5 interspace.  This was complicated by  infection.  The patient has eventually healed solidly his fusion from L2-  L5; however, he has evidence of screw loosening at S1 and no evidence of  complete fusion at L5-S1.  As the L5-S1 level never needed to be  incorporated into the fusion, I think it is reasonable now re-explore  his fusion and remove his instrumentation in hopes of alleviating some  of his persistent mechanical back pain.   OPERATIVE NOTE:  The patient was brought to the operating room and  placed on the operative table in supine position.  After adequate level  of anesthesia was achieved, the patient was placed prone onto Wilson  frame, appropriately padded.  The patient's lumbar region was prepped  and draped sterilely.  A #10  blade was used to make a linear skin  incision over the L2-S1 levels.  This was carried down sharply in the  midline.  Subperiosteal dissection was then performed exposing the  previously placed pedicle screw fixation bilaterally.  The pedicle  screws were disassembled as were the transverse connections.  Rods were  removed.  Screws were removed.  The fusion from L2-L5 was explored  bilaterally and found to be solid.  The fusion at L5-S1 was incomplete  as previously thought.  No attempts were made to augment this fusion.  Wound was then copiously irrigated with antibiotic solution.  A Hemovac  drain was left in the deep wound space.  Wound was then closed in  layers.  Steri-Strips and sterile dressing were applied.  There were no  apparent complications.  The patient tolerated the procedure well and he  returns to recovery room postoperatively.           ______________________________  Kathaleen Maser Pool, M.D.    HAP/MEDQ  D:  08/28/2008  T:  08/29/2008  Job:  [id number]

## 2010-11-01 NOTE — Consult Note (Signed)
NAMEKEIDAN, AUMILLER                ACCOUNT NO.:  [id number]   MEDICAL RECORD NO.:  [id number]          PATIENT TYPE:  INP   LOCATION:  3306                         FACILITY:  [facility]   PHYSICIAN:  Pramod P. Pearlean Brownie, MD    DATE OF BIRTH:  [birth year]   DATE OF CONSULTATION:  DATE OF DISCHARGE:                                 CONSULTATION   REFERRING PHYSICIAN:  Kalman Shan, MD   REASON FOR REFERRAL:  Altered mental status.   HISTORY OF PRESENT ILLNESS:  Mr. Uhlig is a 70 year old Caucasian male,  who was transferred to [facility] on 07/26/2007, from [facility] where he was admitted for pulmonary embolism a week  before. The patient has a past medical history of chronic low back pain.  He underwent extensive L2 to L5 laminectomy with posterior  intervertebral body fusion in mid January. He was discharged home for  three days, but had severe pain. He came back and had redo surgery on  January 23.  Following discharge, he was home again for two or three  days with some mild confusion and in a lot of pain. He was admitted to  [facility] with pulmonary embolism and deep vein  thrombosis. He was started on Heparin and Coumadin and was transferred  after medical stabilization to [facility] on 07/26/2007. Since  then he has been complaining of severe back pain as well as right hip  pain. CT scan of the lumbar spine as well as a scan of the lumbar spine  have shown a large fluid collection at the surgical site extending to  the subabdominal space. 60 cc of this was aspirated and found to be  hemorrhagic.  Dr. Jordan Likes thinks this maybe infected and the patient is  planned to undergo another surgery this afternoon for removal of this  fluid collection.  The patient's mental status has apparently been quite  fluctuating and he has been confused and having some intermittent  mumbling and slurred speech. The patient is unable to provide a good  history, but this is obtained after discussion with his son and wife,  who are present at the bedside.  The patient apparently is in a lot of  pain and he has been treated with a lot of pain medications over the  last one month.  He has had times when he is quite lucid and more easily  arousable, but night times he is quite sleepy and hard to arouse. He  apparently had a blood gas, which was quite acceptable this morning, but  he was quite confused and hard to arouse, and Dr. Marchelle Gearing asked me for  my opinion. He has not been complaining of any headaches. There has been  no focal extremity weakness, numbness or any seizure activity noted.   PAST MEDICAL HISTORY:  Is significant for a chronic low back pain.  Benign prostatic hyperplasia and laryngitis. Gastroesophageal reflux  disease. Hypertension. Obstructive sleep apnea. Osteoarthritis. Thyroid  nodule. Anxiety.   PAST SURGICAL HISTORY:  Appendectomy. Hemilaminectomy with disk removal.  Shoulder surgery. Two  recent back surgeries as stated above.   MEDICATION ALLERGIES:  None.   HOME MEDICATIONS PRIOR TO THIS ADMISSION:  Celebrex, Flomax, Prilosec,  Lopressor, Lexapro, Amitriptyline, potassium, Hydrochlorothiazide,  Xanax, Meclizine, Flexeril, Benadryl, Prednisone.   MEDICATIONS DURING HOSPITALIZATION:  Zosyn, Vancomycin and Cipro has  been added today. Morphine every three to four hourly.   SOCIAL HISTORY:  The patient is married and lives with his wife. He does  not smoke or drink.   FAMILY HISTORY:  Is noncontributory.   PHYSICAL EXAMINATION:  GENERAL: Reveals an obese, middle-age Caucasian  gentleman, pleasant not in distress.  He is today afebrile.  VITAL SIGNS: His pulse rateis 82 per minute, respirations 20 per minute.  Blood pressure 139/66. O2 SAT is 96% on room air. Distal pulses are well  felt.  HEAD: Is atraumatic.  NECK: Is supple. No bruits.  CARDIAC EXAM: Regular. Heart sounds no murmur or gallop.  LUNGS:  Clear to auscultation.  ABDOMEN: Soft and nontender.  NEUROLOGIC EXAM: The patient is awake. He is oriented to person and  place, but not to time. He can recognize his family members. He knows  Susa Loffler is the Economist. He is unable to name his surgeon, but he  knows he has had back surgeries.  He follows simple commands.  There is  no aphasia or apraxia. His speech is soft and at times mumbles, but he  can be understood most of the time.  He follows commands quite well  moments of full range. Pupils are 3 mm, but reactive. Visual acuity and  fields are adequate. Face is symmetric. Tongue is midline.  EXTREMITY EXAM: Reveals no upper extremity ___________  Sensation and  coordination is low. Extremity exam is limited due to severe pain in the  right hip and groin. He has good distal strength in both knees and  ankles with depressed  knee and ankle jerks. His plantars downgoing. He  has some subjective hyperesthesia to touch pin prick in the right lower  extremity particularly in the thighs. Gait was not tested. Surgical  scars are noted in the back.   Recent MRI of the lumbar spine CT scan and hospital notes from [facility]  were reviewed.   IMPRESSION:  70 year old gentleman with altered mental status likely due  to delirium, which is multifactorial due to severe back pain with  narcotic and analgesics, as well as antibiotics and ongoing  postoperative infection.   PLAN:  I would recommend minimizing narcotic pain medications if  possible. The patient is going to the OR today for removal of the  infected fluid collection, hopefully his pain improves after that and  there is less need for pain medications, minimizing narcotics and using  nonnarcotic  medication if possible.  So advised  antibiotic Lexapro,  which can cause worsening of confusion and encephalopathy.  ___________  of antibiotics if possible.  Check EEG for seizure activity. Rearrange  his sleep  wake cycle.  I will be happy to follow along. Call for any  questions.           ______________________________  Sunny Schlein. Pearlean Brownie, MD     PPS/MEDQ  D:  07/30/2007  T:  07/31/2007  Job:  [id number]

## 2010-11-01 NOTE — Op Note (Signed)
NAMEYASSER, HEPP NO.:  [id number]   MEDICAL RECORD NO.:  [id number]          PATIENT TYPE:  INP   LOCATION:  3109                         FACILITY:  [facility]   PHYSICIAN:  Kathaleen Maser. Pool, M.D.    DATE OF BIRTH:  [birth year]   DATE OF PROCEDURE:  07/12/2007  DATE OF DISCHARGE:                               OPERATIVE REPORT   ATTENDING PHYSICIAN:  Sherilyn Cooter A. Pool, M.D.   SERVICE:  Neurosurgery.   PREOPERATIVE DIAGNOSES:  Lumbar fusion failure with left L4-5 graft  retropulsion and radiculopathy.  Bilateral L5 pedicle screw pull out.   POSTOPERATIVE DIAGNOSES:  Lumbar fusion failure with left L4-5 graft  retropulsion and radiculopathy.  Bilateral L5 pedicle screw pull out  with the addition of bilateral L5 pedicle fractures.   PROCEDURE:  Re-exploration of lumbar fusion.  Removal of instrumentation  with replacement of the L5 pedicle screws bilaterally and re-  installation of segmental instrumentation from L2 to L5.  Revision of  posterior lumbar interbody fusion at L4-5.   ANESTHESIA:  General endotracheal.   INDICATIONS:  Mr. Melott is a 70 year old male who is a little over 1  week status post L2 through L5 decompression and fusion.  Postoperatively he had done well.  He was discharged home with no back  or radicular pain.  He is ambulating well.  He suddenly developed the  acute onset of back and left lower extremity pain.  These symptoms  failed to subside overnight.  He presented to the emergency room this  morning with severe left lower extremity pain.  Workup demonstrated  retropulsion of his left L4-5 cage.  CT scanning was worrisome for  possible screw pull out of L5.  The patient was counseled as to his  options.  We have decided to proceed with re-exploration of his fusion  with revision as necessary.   PROCEDURE IN DETAIL:  The patient was placed on the operating table in a  supine position.  After an adequate level of anesthesia achieved the  patient was placed prone onto the Wilson frame and appropriately padded  and the patient's lumbar region was prepped and draped sterilely.  A 10  blade was used to make a linear skin incision, opening up the previous  incision.  All suture material was removed.  The wound was then reopened  and a deep self-retaining retractor was placed.  Subcutaneous hematoma  and seroma were evacuated.  The thecal sac and nerve roots were all  explored.  The cage on the left side at L4-5 was obviously retropulsed  and this was causing compression of the exiting left L4 nerve root and  to a lesser degree the left L5 nerve root.  The pedicle screw station  was disassembled.  The rod was removed.  The pedicle screws were then  individually evaluated.  The screws at L2, L3 and L4 were found to be  solid bilaterally.  The screws at L5 were found to be loose.  The screws  were removed.  Further inspection finds that the pedicles themselves had  fractured.  The fracture  on the left side appears to have been crushing  the left L4 nerve root as well.  This was resected using Kerrison  rongeurs to fully decompress the L4 nerve root.  Options were then  entertained.  Fortunately there was significant residual medial pedicle  that could be used for a salvage placement of pedicle screw station at  L5.  Under fluoroscopic guidance the pedicles were drilled and then  probed using a pedicle awl under fluoroscopic guidance.  Each pedicle  awl track was then tapped with first a 5.25 mm screw tap and later with  a 6.25 mm screw tap.  A 6.75 x 50 mm radius screw was placed bilaterally  at L5.  The rod was then placed over the screw heads from L2 to L5.  Locking caps were then placed over the screw heads.  The locking caps  were then engaged with the construct under compression.  Two transverse  connectors were placed.  The wound was then copiously irrigated using a  power irrigator and antibiotic solution.  Vancomycin powder  was laid  down topically.  Prior to the revision and fusion cultures were taken it  should be noted.  The wound was then closed in layers.  A sterile  dressing was applied.  There were no apparent complications.  The  patient tolerated the procedure well and he returns to the recovery room  postop.           ______________________________  Kathaleen Maser. Pool, M.D.     HAP/MEDQ  D:  07/13/2007  T:  07/13/2007  Job:  [id number]

## 2010-11-01 NOTE — Op Note (Signed)
NAMENICASIO, BARLOWE                ACCOUNT NO.:  [id number]   MEDICAL RECORD NO.:  [id number]          PATIENT TYPE:  INP   LOCATION:  3005                         FACILITY:  [facility]   PHYSICIAN:  Kathaleen Maser. Pool, M.D.    DATE OF BIRTH:  [birth year]   DATE OF PROCEDURE:  11/04/2007  DATE OF DISCHARGE:                               OPERATIVE REPORT   PREOPERATIVE DIAGNOSIS:  L4-L5 pseudoarthrosis with hardware failure.   POSTOPERATIVE DIAGNOSIS:  L4-L5 pseudoarthrosis with hardware failure.   PROCEDURE NOTE:  Re-exploration of L2 through L5 fusion.  Removal of  hardware L2-L5.  Revision of posterolateral arthrodesis from L4-S1  utilizing iliac crest autograft and segmental pedicle screw fixation  from L2-S1.  Right iliac crest bone harvest.   SURGEON:  Kathaleen Maser. Pool, M.D.   ASSISTANT:  Donalee Citrin, M.D.   ANESTHESIA:  General endotracheal.   INDICATIONS:  Mr. Mcclintock is a 70 year old male with a complicated  history.  The patient is status post previous L2-L5 decompression and  fusion with subsequent breakdown at the L5 level.  This is status post  revision and then status post subsequent infection.  The patient has  evidence of breakdown at the L4-L5 level with instability.  Pedicle  screws at L5 are obviously loose.  The interbody graft was not taking.  This infection appears to be adequately treated, however.  The patient  presents now for revision of the spinal fusion.   OPERATIVE NOTE:  The patient placed on the operative table in supine  position.  After a level of anesthesia achieved, the patient was placed  prone on Wilson frame firmly padded.  The patient's lumbar regions  prepped and draped.  A 10-blade was used to make a curvilinear skin  incision overlying the L1, 2,3,4,5 and S1 levels.  This carried sharply  to the midline.  Subperiosteal dissection was performed exposing lamina  and facet joints of L1 and L2 as well as the posterolateral arthrodesis  and pedicle screw  fixation from L2-L5 and the L5 and sacrum levels.  Dissection then carried out laterally exposing the transverse processes  of L4 and L5 as well as the sacral ala.  Deep self-retaining retractor  was placed.  Intraoperative fluoroscopy was used and levels were  confirmed.  Pedicle screw fixation from L2-L5 was then disassembled.  The fusions appeared to be healing well and were solid from L2-L4.  The  pedicle screws at L5 were loose and were removed.  There appeared to be  obvious instability.  Mid L4-5, however this was not severe.  There was  no way of incorporating any type of instrumentation into the body of L5  and level left in place.  We decided to take things down to the S1  level.  Entry point of the pedicle was then identified bilaterally at S1  using surface landmarks intraoperative fluoroscopy.  Superficial bone  overlying the pedicles and using high-speed drill.  Each pedicle was  then probed using pedicle awl.  Each pedicle awl track was then tapped  with 5.25-mm screw tap.  Screw tap hole  was probed and found to be solid  within the bone.  Subsequently,  we decided to make the screws  bicortical and the awl was reinserted and the anterior cortex was also  penetrated.  A 7.75 x 45-mm radius screws placed bilaterally at S1 in a  bicortical fashion with very good purchase.  As 2 pedicle was explored,  it was found to be incompatible with a screw placement.  The transverse  processes of L4-L5 as well as sacral ala then decorticated using high-  speed drill.  Morselized autograft from a right iliac crest harvest was  then placed posterolaterally as well as BMP-soaked sponges.  Titanium  rod was then contoured placed over the screw heads from L2-S1.  Locking  caps were placed over screw heads.  Locking caps were then engaged.  Transverse connectors were placed.  A medium Hemovac drain was placed in  the epidural space.  Wound was then irrigated with a Pulsavac and  multiple bags  of Bacitracin solution.  It is then closed in typical  fashion.   It should be noted that upon opening the wound.  A separate dissection  was then made overlying the lumbodorsal fascia to the level of the right  iliac crest.  The right iliac wing was exposed.  Dissection of the  tricortical bone was then removed using osteotomes.  The iliac wing was  then gauged and the bone graft was removed.  After an adequate amount of  bone graft was then removed, all bleeding points were waxed.  The fascia  was reapproximated 0 Vicryl suture.  The wound overall was then closed  in layers.  The skin was reapproximated with the 2-0 nylon suture in an  interrupted vertical mattress fashion.  No complications.  The patient  tolerated the procedure well and he returned to the recovery room  postoperatively.           ______________________________  Kathaleen Maser Pool, M.D.     HAP/MEDQ  D:  11/04/2007  T:  11/05/2007  Job:  [id number]

## 2010-11-01 NOTE — Discharge Summary (Signed)
NAMEKAREL, TURPEN NO.:  [id number]   MEDICAL RECORD NO.:  [id number]          PATIENT TYPE:  IPS   LOCATION:  4009                         FACILITY:  [facility]   PHYSICIAN:  Ellwood Dense, M.D.   DATE OF BIRTH:  05/26/1941   DATE OF ADMISSION:  08/14/2007  DATE OF DISCHARGE:  08/23/2007                               DISCHARGE SUMMARY   DISCHARGE DIAGNOSES:  1. Lumbar stenosis with fusion requiring redo with incision and      drainage secondary to recent Klebsiella infection.  2. Hypertension.  3. Glucose intolerance.  4. Obstructive sleep apnea.  5. Benign prostatic hypertrophy.  6. Degenerative joint disease bilateral knees.  7. Toxic metabolic encephalopathy, resolved.   HISTORY OF PRESENT ILLNESS:  Mr. Fitton is a 70 year old male with  history of hypertension, obstructive sleep apnea, lumbar laminectomy  with redo for stenosis L2-L5 on July 04, 2007, with continued left  lower extremity pain due to failure of fusion L4-5 with retropulsion  requiring a redo on January 23.  He was admitted to [facility]  February 1 with chest pain secondary to PE.  Started on Coumadin for  treatment.  As the patient continued severe back and hip pain with  decrease level of consciousness he was transferred to [facility] on February 6 for further treatment.  The patient was evaluated  by neurosurgery and CT L-spine showed fluid collection that was  aspirated by interventional radiology.  The patient was started on  antibiotics.  Cultures were noted be positive for Pseudomonas.  The  patient with somnolence, hypoxia, as well as confusion.  Critical care  medicine was consulted.  The patient was treated with BiPAP for  respiratory support.  Neuro was consulted for input and felt the  patient's confusion secondary to delirium, question medication related.  On February 10 the patient underwent I&D of lumbar wound by Dr. Jordan Likes.  Postoperatively, drain has  been deceased and currently the patient  continues on IV Zosyn for four additional weeks and p.o. Cipro  indefinitely.  Currently, the patient is requiring cues for mobility,  requiring total assist 80% to transfer and ambulate 12 feet.  Rehab  consulted for further therapies.   PAST MEDICAL HISTORY:  Significant for:  1. Obstructive sleep apnea.  However, has been unable to tolerate CPAP      at home.  2. BPH.  3. History of thyroid nodule.  4. Anxiety disorder.  5. Hemilaminectomy in the past.  6. Glucose intolerance.  7. Depression.  8. DJD bilateral knees.   ALLERGIES:  NEURONTIN and PERCOCET.   FAMILY HISTORY:  Positive for coronary artery disease and cancer.   SOCIAL HISTORY:  The patient is married, lives in a one-level home with  four steps at entry.  He is a retired [employer].  He has required  support physical as well as support for ADLs since last fall.  He does  not use any tobacco or alcohol.   HOSPITAL COURSE:  Mr. Omran Keelin was admitted to rehab on August 14, 2007, for inpatient therapies  to consist of PT, OT and speech therapy.  Blood pressures were monitored on b.i.d. basis on Lopressor and  hydrochlorothiazide.  These have been ranging from 100s to 130 systolic,  60s to 80s diastolic.  Heart rate stable.  His glucose intolerance was  monitored with CBG checks and these have ranged from 120s to 130s with  occasional high in 150s.  Last weight is at 122 pounds.  The patient's  anxiety levels have been stable.  He has been motivated to participate  in therapy.   He has been limited by issues regarding pain.  He was started on  Duragesic patch 25 mcg an hour as well as hydrocodone prior to therapies  in a.m. and at lunchtime, with p.r.n. Ultram.  Currently, the patient is  limited by knee pain.  Back pain is well controlled.  Lidocaine patches  additionally were added to help with the symptoms of knee pain.  The  patient has been tolerating his CPAP  during this stay.  He is requiring  CPAP at 2 L O2 q.h.s.  The patient and family report that the patient  has improved compliance with the current brand of CPAP and we will  attempt to have [employer] deliver this brand.  Labs done during this stay:  Check of CBC revealed hemoglobin 9.5, hematocrit 28.5, white count 4.6,  platelets 344.  Check of electrolytes revealed sodium 138, potassium  3.7, chloride 94, CO2 36, BUN 10, creatinine 0.74, glucose 123.  Most  recent ESR of March 5 is at 74 and CRP is at 5.7.  PT/INR at time of  discharge was 22.0 and 1.9.  The patient is discharged on 4 mg Coumadin  every day except 6 mg on Mondays and Thursdays.  Next pro time to be  drawn by home health on Monday, March 9, with results to Dr. Wende Neighbors  office.  IV Zosyn is to continue through March 20 for six total weeks of  IV antibiotics.  P.o. Cipro to continue indefinitely, approximately 3  months reported by Dr. Jordan Likes.   The patient's back incision is noted be healing well without any signs  or symptoms of infection.  Incision is intact.  The patient's p.o.  intake is noted to be good.  The patient is continent of bowel and  bladder.  The most recent therapy notes indicate the patient at min  assist to ambulate 25-45 feet with walker.  He requires min to mod  assist for car transfer depending on fatigue level.  Requires mod assist  to navigate two stairs secondary to lower extremity weakness and knee  pain.  Dynamic sitting balance with upper extremity coordination is at  supervision level.  OT has been working with the patient regarding  endurance as well as bathing, dressing and toileting needs.  Currently,  the patient is min assist for self-care.  Noted to have unsteady balance  with increase in fatigue and increasing disorientation when distracted  by pain.  Speech therapy has been working with the patient for  cognition.  The patient is currently at supervision to modified  independent level for  maintaining alternating attention/distraction in  distracting environment.  He is supervision for anticipating safety  needs.  Modified independent for problem solving.  Modified independent  for basic and complex level tasks.  The patient will continue to receive  further follow-up home health PT, OT by [facility].  Home health  RN has been arranged for wound monitoring.  A PICC  line was placed on  August 23, 2007, prior to discharge and home health RN to help assist with  IV antibiotic management.  On August 23, 2007, the patient is discharged  to home.   DISCHARGE MEDICATIONS:  1. Flomax 0.5 mg p.o. per day.  2. Lexapro 20 mg a day.  3. Hydrochlorothiazide 50 mg a day.  4. Celebrex 200 mg a day.  5. Duragesic patch 25 mcg an hour change q.72h.  6. Cipro 750 mg q.a.m. at 8 a.m., 8 p.m.  7. Flexeril 10 mg q.8h.  8. Lopressor 50 mg b.i.d.  9. K-Dur 20 mEq b.i.d.  10.Coumadin 4 mg all days except 6 mg Mondays and Thursdays.  11.Prilosec 20 mg a day.  12.Ferrous sulfate 325 mg one p.o. b.i.d. at 10 a.m. and 10 p.m.  13.Lidocaine patches one to each knee, on 8 a.m., off 8 p.m. daily.  14.Zosyn 3.375 mg IV q.8h. through March 30.  15.Xanax 0.25 mg q.8h. p.r.n.  16.Norco 10/325 mg one p.o. q.4-6h. p.r.n. pain, #90 prescribed.  17.Ultram 50 mg one p.o. q.i.d. p.r.n. pain, #90 prescribed with two      refills.   DIET:  Diabetic diet, low salt.   WOUND CARE:  Keep area clean and dry.   SPECIAL INSTRUCTIONS:  1. To not use amitriptyline.  2. Routine back precautions, wear LS for edge of bed and when out of      bed.  3. Wear CPAP q.h.s.  4. Twenty-four-hour supervision.  5. No strenuous activity.   FOLLOW-UP:  1. The patient to follow up with Dr. Virginia Rochester for routine check in a couple      of weeks.  2. Follow up with Dr. Jordan Likes in 2 weeks for postoperative check as well      as for further instructions regarding antibiotic therapy.  3. Follow up with Dr. Lamar Benes as  needed.      Greg Cutter, P.A.    ______________________________  Ellwood Dense, M.D.    PP/MEDQ  D:  08/23/2007  T:  08/25/2007  Job:  [id number]   cc:   Sherilyn Cooter A. Pool, M.D.  Pramod P. Pearlean Brownie, MD  Royanne Foots

## 2010-11-01 NOTE — Procedures (Signed)
EEG NUMBER:  [id number]   HISTORY:  This is a 70 year old patient who is being evaluated for  confusion.  This is a portable EEG recording.  No skull defects noted.   MEDICATIONS:  Include Lopressor, Protonix, potassium, Flomax, Zosyn,  vancomycin, Cipro, Ultram, Skelaxin, morphine, and Toradol.   EEG CLASSIFICATION:  Dysrhythmia delta grade 1 generalized.   DESCRIPTION RECORDING:  The background rhythm consists of a poorly  modulated background activity of 7 Hz.  There is an overlying the 1-2 Hz  delta activity slowing seen in a generalized fashion over and above  this.  The patient appears to have slowing throughout the recording.  Photic stimulation is performed, but this results in a very minimal  driving response.   At no time during the recording did it appear that she had actual spike,  spike wave discharges, or evidence of focal slowing.  EKG monitor shows  no evidence of cardiac rhythm abnormalities, although an occasional  irregular heart rhythm is seen.  Heart rate is 70-80.   IMPRESSION:  This is an abnormal EEG recording due to diffuse  generalized slowing in the delta and theta frequency range.  Such  recording is nonspecific and suggestive of diffuse neuronal dysfunction  as can be seen with a toxic metabolic encephalopathy.  No epileptiform  discharges are seen.      Marlan Palau, M.D.  Electronically Signed     EAV:[id number]  D:  07/31/2007 16:08:17  T:  08/02/2007 07:16:37  Job #:  [id number]

## 2010-11-03 NOTE — Op Note (Signed)
[name], [name]                ACCOUNT NO.:  [id number]  MEDICAL RECORD NO.:  [id number]           PATIENT TYPE:  I  LOCATION:  3008                         FACILITY:  [facility]  PHYSICIAN:  Kathaleen Maser. Custer Pimenta, M.D.    DATE OF BIRTH:  1941-02-09  DATE OF PROCEDURE:  10/25/2010 DATE OF DISCHARGE:                              OPERATIVE REPORT   PREOPERATIVE DIAGNOSES:  L1-2 stenosis, status post previous L1-2 laminotomy and L2-L5 decompression and fusion.  POSTOPERATIVE DIAGNOSES:  L1-2 stenosis, status post previous L1-2 laminotomy and L2-L5 decompression and fusion.  PROCEDURE NAME:  L1-2 redo decompressive laminectomy with bilateral L1 and L2 decompressive foraminotomies, more than would be required for simple interbody fusion alone.  L1-2 posterior lumbar fusion utilizing Telamon interbody PEEK cages, and local autografting.  L1-2 posterolateral arthrodesis utilizing nonsegmental pedicle screw fixation and local autografting.  SURGEON:  Kathaleen Maser. Mackie Holness, MD  ASSISTANT:  Reinaldo Meeker, MD  ANESTHESIA:  General endotracheal.  INDICATIONS:  [name] is a 70 year old male status post previous L2-L5 decompression and fusion.  The patient presents now with severe back and bilateral lower extremity symptoms.  Workup demonstrates evidence of mechanical instability and stenosis at L1-2.  The patient presents now for decompression and fusion in hopes of improving his symptoms.  OPERATIVE NOTE:  The patient was brought to the operating room and placed on the table in supine position.  After adequate level of anesthesia was achieved, the patient was placed prone onto Wilson frame, appropriately padded.  The patient's lumbar region was prepped and draped in sterilely.  A 10-blade was used to make a curvilinear skin incision overlying the L1-2 interspace.  This was carried down sharply in the midline.  Subperiosteal dissection was then performed exposing the lamina and facet joints of L1  and L2.  Deep self-retaining retractor was placed.  Intraoperative fluoroscopy was used and levels were confirmed.  Transverse process of L1 and L2 were then also exposed. Previous laminectomy at L1-2 was dissected free.  Complete laminectomy of L1 was then performed using Leksell rongeurs, Kerrison rongeurs, and high-speed drill.  All elements of the lamina were removed.  Inferior facetectomies of L1 were performed bilaterally.  Superior facetectomies of L2 were performed bilaterally.  Epidural scar and Ligamentum flavum were elevated and resected in a piecemeal fashion using Kerrison rongeurs.  Wide decompressive foraminotomies were performed along the course of exiting L1 and L2 nerve roots bilaterally.  Epidural venous plexus was coagulated and then cut.  Disk space was incised with 15- blade in a rectangular fashion.  Wide disk space clean-out was achieved using pituitary rongeurs, upbiting pituitary rongeurs, and Epstein curettes.  Disk space was sequentially dilated up to 10 mm, then 10-mm distractor was up to the patient's left side.  Thecal sac and nerve root were protected on the right side.  Disk space was then reamed and then cut with a 10-mm tangent instrument.  Soft tissue was then removed from the interspace.  An 10 x 22-mm Telamon cage packed with morselized autograft was then packed into place and recessed approximately 3 mm from the posterior cortical  margin of L1.  Distractor was removed from the patient's left side.  Thecal sac and nerve roots were protected on the left side.  Once again, disk space was then reamed and then cut with a 10-mm tangent instrument.  Soft tissue was removed from the interspace.  Morselized autograft was then packed into interspace for later fusion.  A second 10 x 22-mm tangent wedge was then packed into the interspace and recessed approximately 1-2 mm from the posterior cortical margin of L1.  Pedicles of L1 and L2 were then identified  using surface landmarks and intraoperative fluoroscopy.  Superficial bone along the pedicle was then removed using high-speed drill.  Each pedicle was then probed using pedicle awl.  Each pedicle awl track was then tapped with 5.25-mm screwdriver, screwed down hole was then probed and found to be solid within the bone.  A 6.75 x 45-mm radius screw was placed bilaterally at L1 and L2.  Transverse process of L1 and L2 was then decorticated using high-speed drill.  Morselized autograft was packed posterolaterally for later fusion.  Short segment titanium rod was then placed over screw heads.  Locking caps were then placed over the screw heads.  Locking caps were then engaged with construct under compression.  Final images revealed good position of bone grafts and hardware at proper operative level with normal alignment of spine. Wound was then irrigated with antibiotic solution.  Hemostasis was then achieved with bipolar electrocautery.  Gelfoam was placed topically for hemostasis as well.  A medium Hemovac drain was left in the epidural space.  The wound was then closed in layers with Vicryl suture.  Steri- Strips and sterile dressing were applied.  There were no complications. The patient tolerated the procedure well and he returned to the recovery room postoperatively.          ______________________________ Kathaleen Maser Jonique Kulig, M.D.     HAP/MEDQ  D:  10/25/2010  T:  10/26/2010  Job:  [id number]  Electronically Signed by Julio Sicks M.D. on 11/03/2010 11:20:05 AM

## 2010-11-04 ENCOUNTER — Inpatient Hospital Stay (HOSPITAL_COMMUNITY): Admission: RE | Admit: 2010-11-04 | Payer: Medicare Other | Source: Ambulatory Visit | Admitting: Neurosurgery

## 2010-11-04 NOTE — Discharge Summary (Signed)
NAMEARMON, Travis                ACCOUNT NO.:  [id number]   MEDICAL RECORD NO.:  [id number]          PATIENT TYPE:  INP   LOCATION:  3006                         FACILITY:  [facility]   PHYSICIAN:  Kathaleen Maser. Pool, M.D.    DATE OF BIRTH:  12/20/1940   DATE OF ADMISSION:  07/04/2007  DATE OF DISCHARGE:  07/09/2007                               DISCHARGE SUMMARY   FINAL DIAGNOSIS:  L2-3, L3-4, and L4-5 severe stenosis with chronic  instability.   POSTOPERATIVE DIAGNOSIS:  L2-3, L3-4, and L4-5 severe stenosis with  chronic instability.   HISTORY OF PRESENT ILLNESS:  [name] is a 70 year old morbid obese  male with history of severe back and bilateral lower extremity pain,  status post previous L2-3 decompressive laminectomy by another  physician.  The patient has evidence of severe stenosis and instability  at L2-3, L3-4, and L4-5.  The patient presents now for 3-level  decompression and fusion with instrumentation.   HOSPITAL COURSE:  With the patient in the operating room, where an  uncomplicated L2 to L5 decompression and fusion with instrumentation was  performed.  Findings at the time of surgery were that of extreme blood  loss requiring intraoperative and postoperative transfusion secondary to  chronic surgical bed oozing.  Postoperatively, the patient was  maintained on the ventilator overnight secondary to his pulmonary  disease and need for fluid resuscitation.  The patient was gradually  weaned from the ventilator and extubated.  He had significant back pain,  but no more lower extremity pain.  Strength and sensation were intact.  The patient's hemodynamic profile stabilized.  Urine output was normal  throughout.  There was no evidence of any organ failure.  The patient  was then transferred from the ICU to the floor, where he was gradually  mobilized with the aid of physical and occupational therapy.  Progressively, he felt better and better.  Back and lower extremity  pains  were much improved from his preoperative stay.  He is quite  excited to go home.  At the time of discharge, the patient is ambulating  without difficulty.  His bowel and bladder function are intact.  Neurologically, he is intact.  His wound is healing well.  Plan is to  discharge him home.  He will follow up in my office in 1 week.           ______________________________  Kathaleen Maser. Pool, M.D.     HAP/MEDQ  D:  09/09/2007  T:  09/10/2007  Job:  [id number]

## 2010-11-04 NOTE — Discharge Summary (Signed)
NAMEJABRE, HEO                ACCOUNT NO.:  [id number]   MEDICAL RECORD NO.:  [id number]          PATIENT TYPE:  INP   LOCATION:  3005                         FACILITY:  [facility]   PHYSICIAN:  Kathaleen Maser. Pool, M.D.    DATE OF BIRTH:  [birth year]   DATE OF ADMISSION:  11/04/2007  DATE OF DISCHARGE:  11/07/2007                               DISCHARGE SUMMARY   FINAL DIAGNOSIS:  L4-L5 pseudoarthrosis.   OPERATION/TREATMENTS:  Re-exploration of lumbar fusion with revision of  lumbar fusion from L2-S1.   HISTORY OF PRESENT ILLNESS:  Mr. Fomby is a 70 year old male status post  multilevel lumbar fusion with evidence of breakdown of the L4-L5 motion  segment.  The patient presents now for revision of his fusion.  His  situation is complicated by previous deep wound space infection.   HOSPITAL COURSE:  The patient went to operating room where an  uncomplicated revision and extension of fusion down to the level of  sacrum was performed.  Postoperatively, the patient did very well.  His  back and lower extremity pain were resolved.  His sensations were much  improved.  His wound is healing well.   CONDITION ON DISCHARGE:  Improved.   DISCHARGE DISPOSITION:  The patient will be discharged home.  He will  followup in my office in 1 week.           ______________________________  Kathaleen Maser. Pool, M.D.     HAP/MEDQ  D:  12/10/2007  T:  12/11/2007  Job:  [id number]

## 2010-11-04 NOTE — Discharge Summary (Signed)
[name], [name]                ACCOUNT NO.:  [id number]   MEDICAL RECORD NO.:  [id number]          PATIENT TYPE:  INP   LOCATION:  3028                         FACILITY:  [facility]   PHYSICIAN:  Kathaleen Maser. Pool, M.D.    DATE OF BIRTH:  [birth year]   DATE OF ADMISSION:  07/26/2007  DATE OF DISCHARGE:  08/14/2007                               DISCHARGE SUMMARY   FINAL DIAGNOSES:  1. Status post lumbar decompression and fusion from L2-L5 with      instrumentation.  2. Deep wound infection.  3. Status post pulmonary embolism secondary to deep venous thrombosis.   HISTORY OF PRESENT ILLNESS:  Mr. Nickson is a 70 year old male who is  status post previous 3-level lumbar decompression and fusion in this  patient.  He has had a complicated postoperative course with a history  of initial hardware failure requiring reexploration and revision of this  spinal fusion.  The patient was recently readmitted to [facility] with signs and symptoms of pulmonary embolus confirmed by CT  scanning.  The patient has been anticoagulated.  Progressively, since  that time, the patient's mental status has deteriorated and he is  displaying signs worrisome for wound infection.  The patient is  transferred to [facility] for further care.  Evaluation at this time  including CT scan is worrisome for deep wound space infection.  The  patient was taken to the operating room for irrigation and debridement  of this wound.   HOSPITAL COURSE:  The patient was taken to the operating room where an  uncomplicated I&D of his lumbar wound was performed.  Cultures of this  wound grew Pseudomonas aeruginosa.  The patient was placed on Zosyn and  Cipro for coverage of this.  Aggressive local wound care was instituted.  Progressively, the patient's mental status improved.  His back pain  slowly improved as well.  The patient's pulmonary status improved.  He  remained anticoagulated throughout his course.  He was  transitioned over  to Coumadin.  At the time of discharge, the patient is tolerating  regular diet.  He is able to sit upright and ambulate for short periods  with the aid of physical therapy.  His wound is now healing well.  He  remains on antibiotics and will so for at least 6 weeks of total IV  antibiotics and probable 3 months of p.o. antibiotics following this.   CONDITION ON DISCHARGE:  Stable.           ______________________________  Kathaleen Maser. Pool, M.D.     HAP/MEDQ  D:  09/09/2007  T:  09/09/2007  Job:  [id number]

## 2010-11-04 NOTE — Discharge Summary (Signed)
[name], [name]                ACCOUNT NO.:  [id number]   MEDICAL RECORD NO.:  [id number]          PATIENT TYPE:  INP   LOCATION:  3038                         FACILITY:  [facility]   PHYSICIAN:  Kathaleen Maser. Pool, M.D.    DATE OF BIRTH:  [birth year]   DATE OF ADMISSION:  07/12/2007  DATE OF DISCHARGE:  07/17/2007                               DISCHARGE SUMMARY   FINAL DIAGNOSES:  1. Status post L2 to L5 posterior lumbar decompression and fusion with      instrumentation.  2. Subsequent hardware and fusion failure at L4-L5.   HISTORY OF PRESENT ILLNESS:  Mr. Gintz is a morbidly obese 70 year old  male status post previous 3-level lumbar decompression and fusion with  instrumentation.  Postoperatively, the patient had been doing well.  He  presents now with acute worsening of his back pain with severe left  lower extremity radicular pain.  Workup in the emergency room  demonstrates evidence of probable hardware failure at the L5 level with  migration of his interbody cage on the left-sided L4-L5.  I have  discussed the situation with the patient.  Plan is to return to the  operating room for reexploration of his fusion with revision of the  fusion as necessary.   HOSPITAL COURSE:  The patient was taken to the operative room where his  lumbar fusion was reexplored.  Findings at the time of surgery were that  of screw pull-out and lateral displacement of his pedicle screw at the  L5 level.  Instrumentations at L2, L3, and L4 were intact.  The cage was  found to be posteriorly migrated.  This was impacted back in place.  His  pedicle screw insertion was redirected and replaced.  The construct was  reassembled and felt to be solid.  The patient was taken to the recovery  room and subsequently to the ICU where he was allowed to emerge from the  anesthesia.  He was then kept intubated overnight secondary to his  chronic pulmonary failure and sleep apnea.  Progressively, the patient  was  extubated and mobilized.  His back and lower extremity pain were  much improved.  He was able to ambulate without difficulty and felt much  better.   CONDITION AT DISCHARGE:  Improved.   DISCHARGE DISPOSITION:  The patient will be discharged home.  He will  follow up in my office in 1 week.           ______________________________  Kathaleen Maser. Pool, M.D.     HAP/MEDQ  D:  09/09/2007  T:  09/09/2007  Job:  [id number]

## 2010-11-30 ENCOUNTER — Other Ambulatory Visit: Payer: Self-pay | Admitting: Neurosurgery

## 2010-11-30 ENCOUNTER — Ambulatory Visit
Admission: RE | Admit: 2010-11-30 | Discharge: 2010-11-30 | Disposition: A | Discharge status: Home / Self Care | Payer: Medicare Other | Source: Ambulatory Visit | Attending: Neurosurgery | Admitting: Neurosurgery

## 2010-11-30 DIAGNOSIS — M545 Low back pain: Secondary | ICD-10-CM

## 2011-01-17 NOTE — Discharge Summary (Signed)
  NAMEJAHDIEL, KROL NO.:  [id number]  MEDICAL RECORD NO.:  [id number]  LOCATION:  3008                         FACILITY:  [facility]  PHYSICIAN:  Kathaleen Maser. Jeremi Losito, M.D.    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  HISTORY OF PRESENT ILLNESS:  Mr. Zorn is a 70 year old male status post previous L2-L5 decompression and fusion who presents now with severe back and bilateral lower extremity pain.  Workup demonstrates evidence of marked stenosis and instability at the L1-2 level.  The patient presents now for decompression and fusion in hopes of improving his symptoms.  HOSPITAL COURSE:  The patient was taken to operating room where an uncomplicated L1-2 decompression and fusion with instrumentation was performed.  Postoperatively, the patient had marked improvement of his back pain and lower extremity function.  He was mobilized fairly rapidly with physical and occupational therapy.  His pain control was very much improved and he was happy with this progress.  At time of discharge, the patient's wound is healing well and was tolerating a regular diet. Bowel and bladder functions were working well.  CONDITION AT DISCHARGE:  Improved.  DISCHARGE DISPOSITION:  The patient will be discharged home.  He will follow up in my office in 1 week.          ______________________________ Kathaleen Maser. Nanako Stopher, M.D.     HAP/MEDQ  D:  01/06/2011  T:  01/06/2011  Job:  [id number]  Electronically Signed by Julio Sicks M.D. on 01/17/2011 09:41:55 AM

## 2011-03-09 LAB — COMPREHENSIVE METABOLIC PANEL
AST: 15
Albumin: 2.9 — ABNORMAL LOW
Calcium: 8.8
Chloride: 103
Creatinine, Ser: 0.68
GFR calc Af Amer: >60
Total Bilirubin: 0.6

## 2011-03-09 LAB — BASIC METABOLIC PANEL
BUN: 22
BUN: 13
BUN: 14
BUN: 18
BUN: 21
BUN: 23
BUN: 9
CO2: 24
CO2: 29
CO2: 30
Calcium: 7.8 — ABNORMAL LOW
Calcium: 8.2 — ABNORMAL LOW
Calcium: 8.3 — ABNORMAL LOW
Calcium: 8.7
Chloride: 98
Chloride: 103
Chloride: 105
Chloride: 105
Chloride: 108
Chloride: 108
Creatinine, Ser: 0.69
Creatinine, Ser: 0.51
Creatinine, Ser: 0.55
Creatinine, Ser: 0.68
Creatinine, Ser: 0.7
Creatinine, Ser: 0.7
GFR calc Af Amer: >60
GFR calc Af Amer: >60
GFR calc Af Amer: >60
GFR calc Af Amer: >60
GFR calc non Af Amer: >60
GFR calc non Af Amer: >60
GFR calc non Af Amer: >60
GFR calc non Af Amer: >60
GFR calc non Af Amer: >60
GFR calc non Af Amer: >60
GFR calc non Af Amer: >60
Glucose, Bld: 133 — ABNORMAL HIGH
Glucose, Bld: 136 — ABNORMAL HIGH
Glucose, Bld: 156 — ABNORMAL HIGH
Glucose, Bld: 163 — ABNORMAL HIGH
Glucose, Bld: 173 — ABNORMAL HIGH
Potassium: 3.4 — ABNORMAL LOW
Potassium: 4
Potassium: 4.1
Potassium: 4.7
Potassium: 4.8
Sodium: 138

## 2011-03-09 LAB — BLOOD GAS, ARTERIAL
Acid-Base Excess: 3.8 — ABNORMAL HIGH
Acid-Base Excess: 3.8 — ABNORMAL HIGH
Acid-base deficit: 27.8 — ABNORMAL HIGH
Bicarbonate: 22.2
Bicarbonate: 30.4 — ABNORMAL HIGH
Drawn by: 246861
Drawn by: 283401
FIO2: 0.4
FIO2: 0.4
FIO2: 0.5
FIO2: 0.6
MECHVT: 600
MECHVT: 700
Mode: POSITIVE
O2 Saturation: 93.8
O2 Saturation: 94.2
O2 Saturation: 95.5
O2 Saturation: 97.2
PEEP: 5
PEEP: 5
PEEP: 5
PEEP: 5
Patient temperature: 98.4
Patient temperature: 98.6
Patient temperature: 98.6
Patient temperature: 98.6
Pressure support: 8
RATE: 14
pCO2 arterial: 37.9
pH, Arterial: 7.432
pH, Arterial: 7.444
pO2, Arterial: 166 — ABNORMAL HIGH
pO2, Arterial: 65.9 — ABNORMAL LOW
pO2, Arterial: 74.4 — ABNORMAL LOW

## 2011-03-09 LAB — CBC
HCT: 19.8 — ABNORMAL LOW
HCT: 25.1 — ABNORMAL LOW
HCT: 26.6 — ABNORMAL LOW
HCT: 28.2 — ABNORMAL LOW
HCT: 30.8 — ABNORMAL LOW
HCT: 48.9
Hemoglobin: 6.9 — CL
Hemoglobin: 8.3 — ABNORMAL LOW
Hemoglobin: 9.4 — ABNORMAL LOW
MCHC: 33.2
MCHC: 34
MCHC: 34
MCHC: 34.9
MCV: 85.8
MCV: 85.7
MCV: 86.1
MCV: 86.6
MCV: 86.6
MCV: 86.9
MCV: 86.9
MCV: 95
Platelets: 232
Platelets: 114 — ABNORMAL LOW
Platelets: 130 — ABNORMAL LOW
Platelets: 167
Platelets: 252
Platelets: 256
Platelets: 259
Platelets: 265
RBC: 2.87 — ABNORMAL LOW
RBC: 2.87 — ABNORMAL LOW
RBC: 3.29 — ABNORMAL LOW
RBC: 5.15
RDW: 15.1
RDW: 15.6 — ABNORMAL HIGH
RDW: 15.8 — ABNORMAL HIGH
RDW: 16 — ABNORMAL HIGH
RDW: 16.3 — ABNORMAL HIGH
RDW: 16.5 — ABNORMAL HIGH
WBC: 6.8
WBC: 10.6 — ABNORMAL HIGH
WBC: 11 — ABNORMAL HIGH
WBC: 11.7 — ABNORMAL HIGH
WBC: 6.3
WBC: 6.3
WBC: 8.7
WBC: 8.9

## 2011-03-09 LAB — PROTIME-INR
INR: 1
INR: 1.2
Prothrombin Time: 15.6 — ABNORMAL HIGH

## 2011-03-09 LAB — DIFFERENTIAL
Basophils Absolute: 0
Basophils Relative: 0
Eosinophils Absolute: 0.1
Eosinophils Absolute: 0
Eosinophils Relative: 0
Eosinophils Relative: 4
Lymphocytes Relative: 15
Lymphs Abs: 1.1
Lymphs Abs: 0.3 — ABNORMAL LOW
Lymphs Abs: 1
Monocytes Absolute: 0.4
Monocytes Absolute: 0.5
Monocytes Relative: 7
Neutrophils Relative %: 74

## 2011-03-09 LAB — URINE CULTURE
Colony Count: NO GROWTH
Culture: NO GROWTH

## 2011-03-09 LAB — CROSSMATCH

## 2011-03-09 LAB — RETICULOCYTES
RBC.: 3.06 — ABNORMAL LOW
Retic Count, Absolute: 119.3

## 2011-03-09 LAB — POCT I-STAT 7, (LYTES, BLD GAS, ICA,H+H)
Acid-Base Excess: 2
Acid-Base Excess: 3 — ABNORMAL HIGH
Acid-Base Excess: 5 — ABNORMAL HIGH
Bicarbonate: 27 — ABNORMAL HIGH
Calcium, Ion: 1.18
Hemoglobin: 11.9 — ABNORMAL LOW
O2 Saturation: 100
O2 Saturation: 100
O2 Saturation: 100
Patient temperature: 36.2
Potassium: 3.9
Potassium: 4.2
Potassium: 4.4
Sodium: 137
TCO2: 31
TCO2: 32
pCO2 arterial: 43.5
pCO2 arterial: 46.1 — ABNORMAL HIGH
pCO2 arterial: 49.5 — ABNORMAL HIGH
pH, Arterial: 7.398
pO2, Arterial: 474 — ABNORMAL HIGH

## 2011-03-09 LAB — WOUND CULTURE: Culture: NO GROWTH

## 2011-03-09 LAB — CULTURE, BLOOD (ROUTINE X 2)
Culture: NO GROWTH
Culture: NO GROWTH

## 2011-03-09 LAB — TYPE AND SCREEN
ABO/RH(D): O POS
Antibody Screen: NEGATIVE

## 2011-03-09 LAB — URINALYSIS, ROUTINE W REFLEX MICROSCOPIC
Bilirubin Urine: NEGATIVE
Glucose, UA: NEGATIVE
Hgb urine dipstick: NEGATIVE
Nitrite: NEGATIVE
Specific Gravity, Urine: 1.009
pH: 7

## 2011-03-09 LAB — CULTURE, BAL-QUANTITATIVE W GRAM STAIN
Colony Count: NO GROWTH
Culture: NO GROWTH

## 2011-03-09 LAB — IRON AND TIBC
Iron: 19 — ABNORMAL LOW
TIBC: 183 — ABNORMAL LOW

## 2011-03-09 LAB — APTT: aPTT: 26

## 2011-03-09 LAB — VITAMIN B12: Vitamin B-12: 552 (ref 211–911)

## 2011-03-09 LAB — POCT I-STAT 4, (NA,K, GLUC, HGB,HCT)
Glucose, Bld: 150 — ABNORMAL HIGH
Operator id: 219291

## 2011-03-09 LAB — ABO/RH: ABO/RH(D): O POS

## 2011-03-10 LAB — COMPREHENSIVE METABOLIC PANEL
Albumin: 2.5 — ABNORMAL LOW
BUN: 16
BUN: 10
Calcium: 9.1
Calcium: 9.1
Chloride: 97
Creatinine, Ser: 0.8
Glucose, Bld: 123 — ABNORMAL HIGH
Total Bilirubin: 0.7
Total Protein: 6.4

## 2011-03-10 LAB — PROTIME-INR
INR: 1.1
INR: 1.1
INR: 1.6 — ABNORMAL HIGH
INR: 1.6 — ABNORMAL HIGH
INR: 1.7 — ABNORMAL HIGH
INR: 2.1 — ABNORMAL HIGH
INR: 2.2 — ABNORMAL HIGH
INR: 2.7 — ABNORMAL HIGH
Prothrombin Time: 13.9
Prothrombin Time: 14.1
Prothrombin Time: 14.4
Prothrombin Time: 15.8 — ABNORMAL HIGH
Prothrombin Time: 19.4 — ABNORMAL HIGH
Prothrombin Time: 21.6 — ABNORMAL HIGH
Prothrombin Time: 24.3 — ABNORMAL HIGH
Prothrombin Time: 29 — ABNORMAL HIGH
Prothrombin Time: 30.2 — ABNORMAL HIGH
Prothrombin Time: 32.2 — ABNORMAL HIGH
Prothrombin Time: 34 — ABNORMAL HIGH

## 2011-03-10 LAB — CBC
HCT: 27.2 — ABNORMAL LOW
HCT: 27.5 — ABNORMAL LOW
HCT: 27.5 — ABNORMAL LOW
HCT: 28.4 — ABNORMAL LOW
HCT: 28.5 — ABNORMAL LOW
HCT: 30.1 — ABNORMAL LOW
HCT: 31.6 — ABNORMAL LOW
HCT: 25.8 — ABNORMAL LOW
HCT: 26.9 — ABNORMAL LOW
HCT: 28.4 — ABNORMAL LOW
HCT: 28.5 — ABNORMAL LOW
HCT: 29.6 — ABNORMAL LOW
Hemoglobin: 10.6 — ABNORMAL LOW
Hemoglobin: 9.3 — ABNORMAL LOW
Hemoglobin: 9.3 — ABNORMAL LOW
Hemoglobin: 9.6 — ABNORMAL LOW
Hemoglobin: 8.7 — ABNORMAL LOW
Hemoglobin: 9 — ABNORMAL LOW
Hemoglobin: 9.5 — ABNORMAL LOW
Hemoglobin: 9.6 — ABNORMAL LOW
Hemoglobin: 9.7 — ABNORMAL LOW
MCHC: 33.6
MCHC: 33.8
MCHC: 33.9
MCHC: 32.8
MCHC: 33.2
MCHC: 33.3
MCHC: 33.6
MCHC: 33.8
MCV: 81.7
MCV: 82.1
MCV: 83.4
MCV: 83.6
MCV: 79.9
MCV: 80.7
MCV: 80.8
MCV: 81.3
Platelets: 201
Platelets: 218
Platelets: 249
Platelets: 290
RBC: 3.37 — ABNORMAL LOW
RBC: 3.37 — ABNORMAL LOW
RBC: 3.61 — ABNORMAL LOW
RBC: 3.76 — ABNORMAL LOW
RBC: 3.2 — ABNORMAL LOW
RBC: 3.34 — ABNORMAL LOW
RBC: 3.56 — ABNORMAL LOW
RBC: 3.64 — ABNORMAL LOW
RDW: 15.6 — ABNORMAL HIGH
RDW: 16 — ABNORMAL HIGH
RDW: 16.2 — ABNORMAL HIGH
RDW: 16.2 — ABNORMAL HIGH
RDW: 16.3 — ABNORMAL HIGH
RDW: 15.7 — ABNORMAL HIGH
RDW: 17.6 — ABNORMAL HIGH
WBC: 10.1
WBC: 7.2
WBC: 8.1
WBC: 8.9

## 2011-03-10 LAB — CROSSMATCH

## 2011-03-10 LAB — URINALYSIS, ROUTINE W REFLEX MICROSCOPIC
Hgb urine dipstick: NEGATIVE
Specific Gravity, Urine: 1.008
Urobilinogen, UA: 0.2
pH: 6.5

## 2011-03-10 LAB — BLOOD GAS, ARTERIAL
Acid-Base Excess: 11.9 — ABNORMAL HIGH
Bicarbonate: 37.6 — ABNORMAL HIGH
Bicarbonate: 39.1 — ABNORMAL HIGH
Delivery systems: POSITIVE
Drawn by: 275531
FIO2: 0.21
O2 Saturation: 89.6
O2 Saturation: 93.5
O2 Saturation: 97
Patient temperature: 98.6
Pressure support: 8
TCO2: 39.6
pH, Arterial: 7.493 — ABNORMAL HIGH
pO2, Arterial: 58.4 — ABNORMAL LOW
pO2, Arterial: 66.9 — ABNORMAL LOW
pO2, Arterial: 72.7 — ABNORMAL LOW

## 2011-03-10 LAB — CULTURE, ROUTINE-ABSCESS

## 2011-03-10 LAB — URINE CULTURE: Colony Count: NO GROWTH

## 2011-03-10 LAB — BASIC METABOLIC PANEL
BUN: 10
CO2: 32
Chloride: 100
Creatinine, Ser: 0.62
GFR calc Af Amer: >60
GFR calc Af Amer: >60
GFR calc non Af Amer: >60
GFR calc non Af Amer: >60
Glucose, Bld: 118 — ABNORMAL HIGH
Potassium: 3.6
Sodium: 133 — ABNORMAL LOW
Sodium: 136

## 2011-03-10 LAB — HEPARIN LEVEL (UNFRACTIONATED)
Heparin Unfractionated: 0.27 — ABNORMAL LOW
Heparin Unfractionated: 0.44
Heparin Unfractionated: 0.5
Heparin Unfractionated: 0.6
Heparin Unfractionated: 0.63

## 2011-03-10 LAB — DIFFERENTIAL
Basophils Absolute: 0
Lymphocytes Relative: 11 — ABNORMAL LOW
Lymphs Abs: 0.8
Monocytes Absolute: 0.6
Monocytes Relative: 11
Neutro Abs: 5.4
Neutro Abs: 3.1
Neutrophils Relative %: 68

## 2011-03-10 LAB — APTT: aPTT: 57 — ABNORMAL HIGH

## 2011-03-10 LAB — C-REACTIVE PROTEIN: CRP: 17.8 — ABNORMAL HIGH (ref <0.6)

## 2011-03-10 LAB — SEDIMENTATION RATE: Sed Rate: 70 — ABNORMAL HIGH

## 2011-03-10 LAB — PHOSPHORUS: Phosphorus: 3.7

## 2011-03-13 LAB — PROTIME-INR
INR: 1.8 — ABNORMAL HIGH
INR: 1.9 — ABNORMAL HIGH
Prothrombin Time: 22 — ABNORMAL HIGH

## 2011-03-13 LAB — SEDIMENTATION RATE: Sed Rate: 74 — ABNORMAL HIGH

## 2011-03-13 LAB — CATH TIP CULTURE

## 2011-03-13 LAB — C-REACTIVE PROTEIN: CRP: 5.7 — ABNORMAL HIGH (ref <0.6)

## 2011-03-15 LAB — PROTIME-INR
INR: 0.9
INR: 1
INR: 1
Prothrombin Time: 13.6
Prothrombin Time: 13.7

## 2011-03-15 LAB — WOUND CULTURE: Culture: NO GROWTH

## 2011-06-20 HISTORY — PX: JOINT REPLACEMENT: SHX530

## 2011-11-29 ENCOUNTER — Other Ambulatory Visit: Payer: Self-pay | Admitting: Neurosurgery

## 2011-11-29 DIAGNOSIS — M545 Low back pain: Secondary | ICD-10-CM

## 2011-12-05 ENCOUNTER — Other Ambulatory Visit: Payer: Medicare Other

## 2011-12-06 ENCOUNTER — Other Ambulatory Visit: Payer: Medicare Other

## 2011-12-06 ENCOUNTER — Ambulatory Visit
Admission: RE | Admit: 2011-12-06 | Discharge: 2011-12-06 | Disposition: A | Discharge status: Home / Self Care | Payer: Medicare Other | Source: Ambulatory Visit | Attending: Neurosurgery | Admitting: Neurosurgery

## 2011-12-06 DIAGNOSIS — M545 Low back pain: Secondary | ICD-10-CM

## 2011-12-22 ENCOUNTER — Encounter (HOSPITAL_COMMUNITY): Payer: Self-pay | Admitting: Pharmacy Technician

## 2011-12-26 ENCOUNTER — Other Ambulatory Visit: Payer: Self-pay | Admitting: Neurosurgery

## 2012-01-02 ENCOUNTER — Other Ambulatory Visit (HOSPITAL_COMMUNITY): Payer: Medicare Other

## 2012-01-03 ENCOUNTER — Encounter (HOSPITAL_COMMUNITY)
Admission: RE | Admit: 2012-01-03 | Discharge: 2012-01-03 | Disposition: A | Discharge status: Home / Self Care | Payer: Medicare Other | Source: Ambulatory Visit | Attending: Neurosurgery | Admitting: Neurosurgery

## 2012-01-03 ENCOUNTER — Ambulatory Visit (HOSPITAL_COMMUNITY)
Admission: RE | Admit: 2012-01-03 | Discharge: 2012-01-03 | Disposition: A | Discharge status: Home / Self Care | Payer: Medicare Other | Source: Ambulatory Visit | Attending: Neurosurgery | Admitting: Neurosurgery

## 2012-01-03 ENCOUNTER — Encounter (HOSPITAL_COMMUNITY): Payer: Self-pay

## 2012-01-03 DIAGNOSIS — Z01810 Encounter for preprocedural cardiovascular examination: Secondary | ICD-10-CM | POA: Insufficient documentation

## 2012-01-03 DIAGNOSIS — R0602 Shortness of breath: Secondary | ICD-10-CM | POA: Insufficient documentation

## 2012-01-03 DIAGNOSIS — Z01818 Encounter for other preprocedural examination: Secondary | ICD-10-CM | POA: Insufficient documentation

## 2012-01-03 DIAGNOSIS — M47814 Spondylosis without myelopathy or radiculopathy, thoracic region: Secondary | ICD-10-CM | POA: Insufficient documentation

## 2012-01-03 DIAGNOSIS — I10 Essential (primary) hypertension: Secondary | ICD-10-CM | POA: Insufficient documentation

## 2012-01-03 HISTORY — DX: Radiculopathy, lumbar region: M54.16

## 2012-01-03 HISTORY — DX: Essential (primary) hypertension: I10

## 2012-01-03 HISTORY — DX: Unspecified viral hepatitis B without hepatic coma: B19.10

## 2012-01-03 HISTORY — DX: Frequency of micturition: R35.0

## 2012-01-03 HISTORY — DX: Sleep apnea, unspecified: G47.30

## 2012-01-03 HISTORY — DX: Unspecified osteoarthritis, unspecified site: M19.90

## 2012-01-03 HISTORY — DX: Major depressive disorder, single episode, unspecified: F32.9

## 2012-01-03 HISTORY — DX: Drug induced constipation: K59.03

## 2012-01-03 HISTORY — DX: Depression, unspecified: F32.A

## 2012-01-03 HISTORY — DX: Headache: R51

## 2012-01-03 LAB — CBC
HCT: 39.5 % (ref 39.0–52.0)
Hemoglobin: 13.6 g/dL (ref 13.0–17.0)
MCV: 85.5 fL (ref 78.0–100.0)
RBC: 4.62 MIL/uL (ref 4.22–5.81)
RDW: 13.7 % (ref 11.5–15.5)
WBC: 5 10*3/uL (ref 4.0–10.5)

## 2012-01-03 LAB — DIFFERENTIAL
Eosinophils Relative: 2 % (ref 0–5)
Lymphocytes Relative: 25 % (ref 12–46)
Lymphs Abs: 1.2 10*3/uL (ref 0.7–4.0)

## 2012-01-03 LAB — COMPREHENSIVE METABOLIC PANEL
Albumin: 4.3 g/dL (ref 3.5–5.2)
Alkaline Phosphatase: 115 U/L (ref 39–117)
BUN: 31 mg/dL — ABNORMAL HIGH (ref 6–23)
Chloride: 105 mEq/L (ref 96–112)
Glucose, Bld: 95 mg/dL (ref 70–99)
Potassium: 4.9 mEq/L (ref 3.5–5.1)
Total Bilirubin: 0.5 mg/dL (ref 0.3–1.2)

## 2012-01-03 LAB — SURGICAL PCR SCREEN: MRSA, PCR: NEGATIVE

## 2012-01-03 LAB — TYPE AND SCREEN

## 2012-01-03 NOTE — Pre-Procedure Instructions (Signed)
20 [name]  01/03/2012   Your procedure is scheduled on:  Monday, July 22  Report to [facility] at 0530 AM.  Call this number if you have problems the morning of surgery: [phone]   Remember:   Do not eat food:After Midnight.  May have clear liquids:until Midnight .    Take these medicines the morning of surgery with A SIP OF WATER: *Oxycontin,Tizanidine,Lyrica,Metoprolol,Topiramate** ,Venlafaxine    Do not wear jewelry, make-up or nail polish.  Do not wear lotions, powders, or perfumes. You may wear deodorant.  Do not shave 48 hours prior to surgery. Men may shave face and neck.  Do not bring valuables to the hospital.  Contacts, dentures or bridgework may not be worn into surgery.  Leave suitcase in the car. After surgery it may be brought to your room.  For patients admitted to the hospital, checkout time is 11:00 AM the day of discharge.   Patients discharged the day of surgery will not be allowed to drive home.  Name and phone number of your driver: *n/a*  Special Instructions: CHG Shower Use Special Wash: 1/2 bottle night before surgery and 1/2 bottle morning of surgery.   Please read over the following fact sheets that you were given: Pain Booklet, Coughing and Deep Breathing, Blood Transfusion Information, MRSA Information and Surgical Site Infection Prevention

## 2012-01-04 NOTE — Progress Notes (Signed)
Called pt regarding positive nasal cx for SA.   spoke with the patient"s wife ... Instructed to  Get mupiricin and use 2x day x 5 days.  Nasally ... [phone]

## 2012-01-07 MED ORDER — CEFAZOLIN SODIUM-DEXTROSE 2-3 GM-% IV SOLR
2.0000 g | INTRAVENOUS | Status: AC
  Administered 2012-01-08: 2 g via INTRAVENOUS
  Filled 2012-01-07: qty 50

## 2012-01-07 MED ORDER — DEXAMETHASONE SODIUM PHOSPHATE 10 MG/ML IJ SOLN
10.0000 mg | INTRAMUSCULAR | Status: DC
  Filled 2012-01-07: qty 1

## 2012-01-08 ENCOUNTER — Ambulatory Visit (HOSPITAL_COMMUNITY): Payer: Medicare Other

## 2012-01-08 ENCOUNTER — Inpatient Hospital Stay (HOSPITAL_COMMUNITY)
Admission: RE | Admit: 2012-01-08 | Discharge: 2012-01-09 | DRG: 460 | Disposition: A | Discharge status: Home / Self Care | Payer: Medicare Other | Source: Ambulatory Visit | Attending: Neurosurgery | Admitting: Neurosurgery

## 2012-01-08 ENCOUNTER — Encounter (HOSPITAL_COMMUNITY): Payer: Self-pay | Admitting: Certified Registered"

## 2012-01-08 ENCOUNTER — Encounter (HOSPITAL_COMMUNITY)
Admission: RE | Disposition: A | Discharge status: Home / Self Care | Payer: Self-pay | Source: Ambulatory Visit | Attending: Neurosurgery

## 2012-01-08 ENCOUNTER — Encounter (HOSPITAL_COMMUNITY): Payer: Self-pay | Admitting: Neurosurgery

## 2012-01-08 ENCOUNTER — Ambulatory Visit (HOSPITAL_COMMUNITY): Payer: Medicare Other | Admitting: Certified Registered"

## 2012-01-08 ENCOUNTER — Encounter (HOSPITAL_COMMUNITY): Payer: Self-pay | Admitting: *Deleted

## 2012-01-08 DIAGNOSIS — Z96619 Presence of unspecified artificial shoulder joint: Secondary | ICD-10-CM

## 2012-01-08 DIAGNOSIS — T84498A Other mechanical complication of other internal orthopedic devices, implants and grafts, initial encounter: Principal | ICD-10-CM | POA: Diagnosis present

## 2012-01-08 DIAGNOSIS — S32009K Unspecified fracture of unspecified lumbar vertebra, subsequent encounter for fracture with nonunion: Secondary | ICD-10-CM | POA: Diagnosis present

## 2012-01-08 DIAGNOSIS — Z981 Arthrodesis status: Secondary | ICD-10-CM

## 2012-01-08 DIAGNOSIS — Y832 Surgical operation with anastomosis, bypass or graft as the cause of abnormal reaction of the patient, or of later complication, without mention of misadventure at the time of the procedure: Secondary | ICD-10-CM | POA: Diagnosis present

## 2012-01-08 DIAGNOSIS — M129 Arthropathy, unspecified: Secondary | ICD-10-CM | POA: Diagnosis present

## 2012-01-08 DIAGNOSIS — F329 Major depressive disorder, single episode, unspecified: Secondary | ICD-10-CM | POA: Diagnosis present

## 2012-01-08 DIAGNOSIS — I10 Essential (primary) hypertension: Secondary | ICD-10-CM | POA: Diagnosis present

## 2012-01-08 DIAGNOSIS — F3289 Other specified depressive episodes: Secondary | ICD-10-CM | POA: Diagnosis present

## 2012-01-08 SURGERY — POSTERIOR LUMBAR FUSION 1 LEVEL
Anesthesia: General | Site: Back | Wound class: Clean

## 2012-01-08 MED ORDER — PROPOFOL 10 MG/ML IV EMUL
INTRAVENOUS | Status: DC | PRN
  Administered 2012-01-08: 200 mg via INTRAVENOUS

## 2012-01-08 MED ORDER — HEMOSTATIC AGENTS (NO CHARGE) OPTIME
TOPICAL | Status: DC | PRN
  Administered 2012-01-08: 1 via TOPICAL

## 2012-01-08 MED ORDER — HYDROMORPHONE HCL PF 1 MG/ML IJ SOLN
0.2500 mg | INTRAMUSCULAR | Status: DC | PRN
  Administered 2012-01-08: 0.5 mg via INTRAVENOUS

## 2012-01-08 MED ORDER — PREGABALIN 200 MG PO CAPS: 200.0000 mg | ORAL_CAPSULE | Freq: Two times a day (BID) | ORAL | Status: DC

## 2012-01-08 MED ORDER — SODIUM CHLORIDE 0.9 % IV SOLN
INTRAVENOUS | Status: AC
  Filled 2012-01-08: qty 500

## 2012-01-08 MED ORDER — ROCURONIUM BROMIDE 100 MG/10ML IV SOLN
INTRAVENOUS | Status: DC | PRN
  Administered 2012-01-08: 50 mg via INTRAVENOUS

## 2012-01-08 MED ORDER — PREGABALIN 75 MG PO CAPS
400.0000 mg | ORAL_CAPSULE | Freq: Every day | ORAL | Status: DC
  Administered 2012-01-08: 400 mg via ORAL
  Filled 2012-01-08: qty 2

## 2012-01-08 MED ORDER — METOPROLOL TARTRATE 50 MG PO TABS
50.0000 mg | ORAL_TABLET | Freq: Two times a day (BID) | ORAL | Status: DC
  Administered 2012-01-08: 50 mg via ORAL
  Filled 2012-01-08 (×3): qty 1

## 2012-01-08 MED ORDER — POTASSIUM CHLORIDE CRYS ER 20 MEQ PO TBCR
20.0000 meq | EXTENDED_RELEASE_TABLET | Freq: Every day | ORAL | Status: DC
  Administered 2012-01-08 – 2012-01-09 (×2): 20 meq via ORAL
  Filled 2012-01-08 (×2): qty 1

## 2012-01-08 MED ORDER — OXYCODONE-ACETAMINOPHEN 5-325 MG PO TABS: 1.0000 | ORAL_TABLET | ORAL | Status: DC | PRN

## 2012-01-08 MED ORDER — PHENYLEPHRINE HCL 10 MG/ML IJ SOLN
INTRAMUSCULAR | Status: DC | PRN
  Administered 2012-01-08 (×4): 40 ug via INTRAVENOUS

## 2012-01-08 MED ORDER — TOPIRAMATE 25 MG PO CPSP
75.0000 mg | ORAL_CAPSULE | Freq: Three times a day (TID) | ORAL | Status: DC
Start: 2012-01-08 — End: 2012-01-08

## 2012-01-08 MED ORDER — TIZANIDINE HCL 4 MG PO TABS: 0.5000 mg | ORAL_TABLET | Freq: Four times a day (QID) | ORAL | Status: DC

## 2012-01-08 MED ORDER — OMEGA-3-ACID ETHYL ESTERS 1 G PO CAPS
1.0000 g | ORAL_CAPSULE | Freq: Every day | ORAL | Status: DC
  Administered 2012-01-08 – 2012-01-09 (×2): 1 g via ORAL
  Filled 2012-01-08 (×2): qty 1

## 2012-01-08 MED ORDER — SODIUM CHLORIDE 0.9 % IJ SOLN
3.0000 mL | Freq: Two times a day (BID) | INTRAMUSCULAR | Status: DC
  Administered 2012-01-08 – 2012-01-09 (×2): 3 mL via INTRAVENOUS

## 2012-01-08 MED ORDER — OXYCODONE HCL 5 MG PO TABS: 30.0000 mg | ORAL_TABLET | ORAL | Status: DC | PRN

## 2012-01-08 MED ORDER — BUPIVACAINE HCL (PF) 0.25 % IJ SOLN
INTRAMUSCULAR | Status: DC | PRN
  Administered 2012-01-08: 30 mL

## 2012-01-08 MED ORDER — TRAMADOL HCL 50 MG PO TABS
50.0000 mg | ORAL_TABLET | Freq: Three times a day (TID) | ORAL | Status: DC
  Administered 2012-01-08 – 2012-01-09 (×4): 50 mg via ORAL
  Filled 2012-01-08 (×6): qty 1

## 2012-01-08 MED ORDER — DEXAMETHASONE SODIUM PHOSPHATE 4 MG/ML IJ SOLN
INTRAMUSCULAR | Status: DC | PRN
  Administered 2012-01-08: 10 mg via INTRAVENOUS

## 2012-01-08 MED ORDER — CALCIUM CARBONATE 1250 (500 CA) MG PO TABS
1.0000 | ORAL_TABLET | Freq: Two times a day (BID) | ORAL | Status: DC
  Administered 2012-01-08 – 2012-01-09 (×3): 500 mg via ORAL
  Filled 2012-01-08 (×5): qty 1

## 2012-01-08 MED ORDER — HEPARIN SODIUM (PORCINE) 1000 UNIT/ML IJ SOLN
INTRAMUSCULAR | Status: AC
  Filled 2012-01-08: qty 1

## 2012-01-08 MED ORDER — CEFAZOLIN SODIUM 1-5 GM-% IV SOLN
1.0000 g | Freq: Three times a day (TID) | INTRAVENOUS | Status: AC
  Administered 2012-01-08 – 2012-01-09 (×2): 1 g via INTRAVENOUS
  Filled 2012-01-08 (×3): qty 50

## 2012-01-08 MED ORDER — ALPRAZOLAM 0.25 MG PO TABS: 0.2500 mg | ORAL_TABLET | Freq: Three times a day (TID) | ORAL | Status: DC | PRN

## 2012-01-08 MED ORDER — ONDANSETRON HCL 4 MG/2ML IJ SOLN: 4.0000 mg | INTRAMUSCULAR | Status: DC | PRN

## 2012-01-08 MED ORDER — PHENOL 1.4 % MT LIQD: 1.0000 | OROMUCOSAL | Status: DC | PRN

## 2012-01-08 MED ORDER — ZOLPIDEM TARTRATE 5 MG PO TABS: 5.0000 mg | ORAL_TABLET | Freq: Every evening | ORAL | Status: DC | PRN

## 2012-01-08 MED ORDER — THROMBIN 20000 UNITS EX KIT
PACK | CUTANEOUS | Status: DC | PRN
  Administered 2012-01-08: 20000 [IU] via TOPICAL

## 2012-01-08 MED ORDER — MIDAZOLAM HCL 5 MG/5ML IJ SOLN
INTRAMUSCULAR | Status: DC | PRN
  Administered 2012-01-08: 2 mg via INTRAVENOUS

## 2012-01-08 MED ORDER — CYCLOBENZAPRINE HCL 10 MG PO TABS: 10.0000 mg | ORAL_TABLET | Freq: Three times a day (TID) | ORAL | Status: DC | PRN

## 2012-01-08 MED ORDER — HYDROMORPHONE HCL PF 1 MG/ML IJ SOLN
INTRAMUSCULAR | Status: AC
  Filled 2012-01-08: qty 1

## 2012-01-08 MED ORDER — ACETAMINOPHEN 325 MG PO TABS
650.0000 mg | ORAL_TABLET | ORAL | Status: DC | PRN
  Administered 2012-01-09: 650 mg via ORAL
  Filled 2012-01-08: qty 2

## 2012-01-08 MED ORDER — HYDROCHLOROTHIAZIDE 50 MG PO TABS
50.0000 mg | ORAL_TABLET | Freq: Every day | ORAL | Status: DC
  Administered 2012-01-08 – 2012-01-09 (×2): 50 mg via ORAL
  Filled 2012-01-08 (×2): qty 1

## 2012-01-08 MED ORDER — ACETAMINOPHEN 650 MG RE SUPP: 650.0000 mg | RECTAL | Status: DC | PRN

## 2012-01-08 MED ORDER — MENTHOL 3 MG MT LOZG: 1.0000 | LOZENGE | OROMUCOSAL | Status: DC | PRN

## 2012-01-08 MED ORDER — ONDANSETRON HCL 4 MG/2ML IJ SOLN
INTRAMUSCULAR | Status: DC | PRN
  Administered 2012-01-08: 4 mg via INTRAVENOUS

## 2012-01-08 MED ORDER — SODIUM CHLORIDE 0.9 % IJ SOLN: 3.0000 mL | INTRAMUSCULAR | Status: DC | PRN

## 2012-01-08 MED ORDER — LIDOCAINE HCL (CARDIAC) 20 MG/ML IV SOLN
INTRAVENOUS | Status: DC | PRN
  Administered 2012-01-08: 60 mg via INTRAVENOUS

## 2012-01-08 MED ORDER — SODIUM CHLORIDE 0.9 % IR SOLN
Status: DC | PRN
  Administered 2012-01-08: 09:00:00

## 2012-01-08 MED ORDER — VENLAFAXINE HCL 75 MG PO TABS
112.5000 mg | ORAL_TABLET | Freq: Two times a day (BID) | ORAL | Status: DC
  Administered 2012-01-08 – 2012-01-09 (×2): 112.5 mg via ORAL
  Filled 2012-01-08 (×3): qty 1

## 2012-01-08 MED ORDER — FENTANYL CITRATE 0.05 MG/ML IJ SOLN
INTRAMUSCULAR | Status: DC | PRN
  Administered 2012-01-08: 100 ug via INTRAVENOUS
  Administered 2012-01-08: 50 ug via INTRAVENOUS

## 2012-01-08 MED ORDER — LACTATED RINGERS IV SOLN
INTRAVENOUS | Status: DC | PRN
  Administered 2012-01-08 (×2): via INTRAVENOUS

## 2012-01-08 MED ORDER — NEOSTIGMINE METHYLSULFATE 1 MG/ML IJ SOLN
INTRAMUSCULAR | Status: DC | PRN
  Administered 2012-01-08: 5 mg via INTRAVENOUS

## 2012-01-08 MED ORDER — HYDROMORPHONE HCL PF 1 MG/ML IJ SOLN: 0.5000 mg | INTRAMUSCULAR | Status: DC | PRN

## 2012-01-08 MED ORDER — MECLIZINE HCL 25 MG PO TABS
25.0000 mg | ORAL_TABLET | Freq: Four times a day (QID) | ORAL | Status: DC | PRN
  Filled 2012-01-08: qty 1

## 2012-01-08 MED ORDER — ONDANSETRON HCL 4 MG/2ML IJ SOLN: 4.0000 mg | Freq: Four times a day (QID) | INTRAMUSCULAR | Status: DC | PRN

## 2012-01-08 MED ORDER — TIZANIDINE HCL 2 MG PO TABS
1.0000 mg | ORAL_TABLET | Freq: Four times a day (QID) | ORAL | Status: DC
  Administered 2012-01-08 – 2012-01-09 (×5): 1 mg via ORAL
  Filled 2012-01-08 (×9): qty 0.5

## 2012-01-08 MED ORDER — GLYCOPYRROLATE 0.2 MG/ML IJ SOLN
INTRAMUSCULAR | Status: DC | PRN
  Administered 2012-01-08: .7 mg via INTRAVENOUS

## 2012-01-08 MED ORDER — ACETAMINOPHEN 325 MG PO TABS: 650.0000 mg | ORAL_TABLET | ORAL | Status: DC | PRN

## 2012-01-08 MED ORDER — HYDROCODONE-ACETAMINOPHEN 5-325 MG PO TABS
1.0000 | ORAL_TABLET | ORAL | Status: DC | PRN
  Administered 2012-01-08: 2 via ORAL
  Filled 2012-01-08: qty 2

## 2012-01-08 MED ORDER — PREGABALIN 50 MG PO CAPS
200.0000 mg | ORAL_CAPSULE | Freq: Every day | ORAL | Status: DC
  Administered 2012-01-08 – 2012-01-09 (×2): 200 mg via ORAL
  Filled 2012-01-08: qty 1
  Filled 2012-01-08: qty 2
  Filled 2012-01-08: qty 1

## 2012-01-08 MED ORDER — SODIUM CHLORIDE 0.9 % IV SOLN: 250.0000 mL | INTRAVENOUS | Status: DC

## 2012-01-08 MED ORDER — SENNA 8.6 MG PO TABS
1.0000 | ORAL_TABLET | Freq: Two times a day (BID) | ORAL | Status: DC
  Administered 2012-01-08 – 2012-01-09 (×3): 8.6 mg via ORAL
  Filled 2012-01-08 (×4): qty 1

## 2012-01-08 MED ORDER — ALUM & MAG HYDROXIDE-SIMETH 200-200-20 MG/5ML PO SUSP: 30.0000 mL | Freq: Four times a day (QID) | ORAL | Status: DC | PRN

## 2012-01-08 MED ORDER — BACITRACIN 50000 UNITS IM SOLR
INTRAMUSCULAR | Status: AC
  Filled 2012-01-08: qty 1

## 2012-01-08 SURGICAL SUPPLY — 59 items
BAG DECANTER FOR FLEXI CONT (MISCELLANEOUS) ×2 IMPLANT
BENZOIN TINCTURE PRP APPL 2/3 (GAUZE/BANDAGES/DRESSINGS) ×2 IMPLANT
BLADE SURG ROTATE 9660 (MISCELLANEOUS) IMPLANT
BRUSH SCRUB EZ PLAIN DRY (MISCELLANEOUS) ×2 IMPLANT
BUR MATCHSTICK NEURO 3.0 LAGG (BURR) ×2 IMPLANT
CANISTER SUCTION 2500CC (MISCELLANEOUS) ×2 IMPLANT
CAP LCK SPNE (Orthopedic Implant) ×4 IMPLANT
CAP LOCK SPINE RADIUS (Orthopedic Implant) ×4 IMPLANT
CAP LOCKING (Orthopedic Implant) ×4 IMPLANT
CLOTH BEACON ORANGE TIMEOUT ST (SAFETY) ×2 IMPLANT
CONT SPEC 4OZ CLIKSEAL STRL BL (MISCELLANEOUS) ×4 IMPLANT
COVER BACK TABLE 24X17X13 BIG (DRAPES) IMPLANT
COVER TABLE BACK 60X90 (DRAPES) ×2 IMPLANT
DECANTER SPIKE VIAL GLASS SM (MISCELLANEOUS) ×2 IMPLANT
DERMABOND ADVANCED (GAUZE/BANDAGES/DRESSINGS) ×1
DERMABOND ADVANCED .7 DNX12 (GAUZE/BANDAGES/DRESSINGS) ×1 IMPLANT
DRAPE C-ARM 42X72 X-RAY (DRAPES) ×4 IMPLANT
DRAPE LAPAROTOMY 100X72X124 (DRAPES) ×2 IMPLANT
DRAPE POUCH INSTRU U-SHP 10X18 (DRAPES) ×2 IMPLANT
DRAPE PROXIMA HALF (DRAPES) IMPLANT
DRAPE SURG 17X23 STRL (DRAPES) ×8 IMPLANT
ELECT REM PT RETURN 9FT ADLT (ELECTROSURGICAL) ×2
ELECTRODE REM PT RTRN 9FT ADLT (ELECTROSURGICAL) ×1 IMPLANT
EVACUATOR 1/8 PVC DRAIN (DRAIN) ×2 IMPLANT
GAUZE SPONGE 4X4 16PLY XRAY LF (GAUZE/BANDAGES/DRESSINGS) IMPLANT
GLOVE ECLIPSE 8.5 STRL (GLOVE) ×4 IMPLANT
GLOVE EXAM NITRILE LRG STRL (GLOVE) IMPLANT
GLOVE EXAM NITRILE MD LF STRL (GLOVE) ×2 IMPLANT
GLOVE EXAM NITRILE XL STR (GLOVE) IMPLANT
GLOVE EXAM NITRILE XS STR PU (GLOVE) IMPLANT
GLOVE INDICATOR 7.0 STRL GRN (GLOVE) ×2 IMPLANT
GLOVE SKINSENSE NS SZ6.5 (GLOVE) ×2
GLOVE SKINSENSE STRL SZ6.5 (GLOVE) ×2 IMPLANT
GOWN BRE IMP SLV AUR LG STRL (GOWN DISPOSABLE) ×2 IMPLANT
GOWN BRE IMP SLV AUR XL STRL (GOWN DISPOSABLE) ×4 IMPLANT
GOWN STRL REIN 2XL LVL4 (GOWN DISPOSABLE) IMPLANT
KIT BASIN OR (CUSTOM PROCEDURE TRAY) ×2 IMPLANT
KIT INFUSE SMALL (Orthopedic Implant) ×2 IMPLANT
KIT ROOM TURNOVER OR (KITS) ×2 IMPLANT
MASTERGRAFT STRIP 10CM ×2 IMPLANT
MATRIX MASTERGRAFT STRIP 10CM ×1 IMPLANT
NEEDLE HYPO 22GX1.5 SAFETY (NEEDLE) ×2 IMPLANT
NS IRRIG 1000ML POUR BTL (IV SOLUTION) ×2 IMPLANT
PACK LAMINECTOMY NEURO (CUSTOM PROCEDURE TRAY) ×2 IMPLANT
ROD RADIUS 35MM (Rod) ×4 IMPLANT
SCREW RADIUS 8.75X50 (Screw) ×4 IMPLANT
SPONGE GAUZE 4X4 12PLY (GAUZE/BANDAGES/DRESSINGS) ×2 IMPLANT
SPONGE SURGIFOAM ABS GEL 100 (HEMOSTASIS) ×2 IMPLANT
STRIP CLOSURE SKIN 1/2X4 (GAUZE/BANDAGES/DRESSINGS) ×2 IMPLANT
SUT VIC AB 0 CT1 18XCR BRD8 (SUTURE) ×2 IMPLANT
SUT VIC AB 0 CT1 8-18 (SUTURE) ×2
SUT VIC AB 2-0 CT1 18 (SUTURE) ×2 IMPLANT
SUT VIC AB 3-0 SH 8-18 (SUTURE) ×4 IMPLANT
SYR 20ML ECCENTRIC (SYRINGE) ×2 IMPLANT
TAPE CLOTH SURG 4X10 WHT LF (GAUZE/BANDAGES/DRESSINGS) ×2 IMPLANT
TOWEL OR 17X24 6PK STRL BLUE (TOWEL DISPOSABLE) ×2 IMPLANT
TOWEL OR 17X26 10 PK STRL BLUE (TOWEL DISPOSABLE) ×2 IMPLANT
TRAY FOLEY CATH 14FRSI W/METER (CATHETERS) IMPLANT
WATER STERILE IRR 1000ML POUR (IV SOLUTION) ×2 IMPLANT

## 2012-01-08 NOTE — Anesthesia Postprocedure Evaluation (Signed)
Anesthesia Post Note  Patient: [name]  Procedure(s) Performed: Procedure(s) (LRB): POSTERIOR LUMBAR FUSION 1 LEVEL (N/A)  Anesthesia type: General  Patient location: PACU  Post pain: Pain level controlled and Adequate analgesia  Post assessment: Post-op Vital signs reviewed, Patient's Cardiovascular Status Stable, Respiratory Function Stable, Patent Airway and Pain level controlled  Last Vitals:  Filed Vitals:   01/08/12 1000  BP: 123/72  Pulse: 58  Temp:   Resp: 15    Post vital signs: Reviewed and stable  Level of consciousness: awake, alert  and oriented  Complications: No apparent anesthesia complications

## 2012-01-08 NOTE — Op Note (Signed)
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  Posterior lateral revision arthrodesis L1/2 utilizing local autograft, bone morphogenic protein-soaked sponges, and master graft bone graft extender with nonsegmental pedicle screw instrumentation.  Surgeon:Sarath Privott A.Marisol Giambra, M.D.  Asst. 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 Workup demonstrates evidence of loosening of his L1 screws. Screws at L2. Be solid. Overall alignment reasonably good. Patient tolerated the used from L2-L5.  Operative note: After induction anesthesia, patient positioned prone onto Wilson frame. Patient lumbar region prepped and draped sterilely. 10 blade used to make a linear skin incision extending from T12 down to L2. This carried down sharply in the midline. Supper off [place] Newcomer performed so the lamina of T12 and as well as the previously placed pedicle screw station L1-2. Transverse processes of L1 and L2 as well as the incompletely formed fusion mass were dissected free. Deep self placed intraoperative fluoroscopy used levels were confirmed. Pedicle screw construct was disassembled. Screws and L1 were obviously loose and removed. Screws and L2 were inspected and found to be solid. 8.75 x 50 mm radius screws were placed at the L1 level with good purchase. There is no evidence of pedicle violation. Transverse processes and lateral fusion mass was aggressively decorticated. Morselize autograft was left in place. Bone morphogenic so proteins sponges were placed in the gutters and master Graf bone graft extender was placed over this. Medium Hemovac drain was left at per space. Short segment titanium rods and placed  over the screw heads at L1 and L2. Locking caps and engaged. Locking catching and engaged the counter compression. Final images revealed good position the varus or proper for level with normal S1. Wound is irrigated one final time and then closed in typical fashion. Steri-Strips triggers were applied. There were no apparent locations. Patient was well and returned to cover postop.

## 2012-01-08 NOTE — Transfer of Care (Signed)
Immediate Anesthesia Transfer of Care Note  Patient: [name]  Procedure(s) Performed: Procedure(s) (LRB): POSTERIOR LUMBAR FUSION 1 LEVEL (N/A)  Patient Location: PACU  Anesthesia Type: General  Level of Consciousness: sedated  Airway & Oxygen Therapy: Patient Spontanous Breathing  Post-op Assessment: Report given to PACU RN  Post vital signs: stable  Complications: No apparent anesthesia complications

## 2012-01-08 NOTE — Anesthesia Preprocedure Evaluation (Addendum)
Anesthesia Evaluation  Patient identified by MRN, date of birth, ID band Patient awake    Reviewed: Allergy & Precautions, H&P , NPO status , Patient's Chart, lab work & pertinent test results, reviewed documented beta blocker date and time   Airway Mallampati: II TM Distance: <3 FB Neck ROM: full    Dental  (+) Poor Dentition and Dental Advisory Given   Pulmonary sleep apnea and Continuous Positive Airway Pressure Ventilation ,  breath sounds clear to auscultation        Cardiovascular hypertension, Pt. on medications Rhythm:Regular Rate:Normal     Neuro/Psych  Headaches, PSYCHIATRIC DISORDERS Depression  Neuromuscular disease    GI/Hepatic (+) Hepatitis -, B  Endo/Other    Renal/GU      Musculoskeletal  (+) Arthritis -,   Abdominal (+)  Abdomen: soft. Bowel sounds: normal.  Peds  Hematology   Anesthesia Other Findings   Reproductive/Obstetrics                         Anesthesia Physical Anesthesia Plan  ASA: II  Anesthesia Plan: General   Post-op Pain Management:    Induction: Intravenous  Airway Management Planned: Oral ETT  Additional Equipment:   Intra-op Plan:   Post-operative Plan: Extubation in OR  Informed Consent: I have reviewed the patients History and Physical, chart, labs and discussed the procedure including the risks, benefits and alternatives for the proposed anesthesia with the patient or authorized representative who has indicated his/her understanding and acceptance.     Plan Discussed with: CRNA and Surgeon  Anesthesia Plan Comments:         Anesthesia Quick Evaluation

## 2012-01-08 NOTE — H&P (Signed)
[name] is an 71 y.o. male.   Chief Complaint: Back pain HPI: 71 year old male remotely status post L2-L5 fusion who underwent an L1 to decompression and fusion over one year ago presents now with worsening upper lumbar pain without radiation. Workup demonstrates evidence of obvious pseudarthrosis with hardware loosening at L1-2. Patient presents now for revision.  Past Medical History  Diagnosis Date  . Sleep apnea   . Hypertension   . Depression   . Arthritis   . Urinary frequency   . Lumbar neuralgia   . Headache   . Constipation due to pain medication   . Hepatitis   . Hepatitis B     "years ago"    Past Surgical History  Procedure Date  . Back surgery     7 lumbar surgeries  . Appendectomy   . Joint replacement     Rt shoulder    History reviewed. No pertinent family history. Social History:  reports that he has never smoked. His smokeless tobacco use includes Snuff. He reports that he does not drink alcohol or use illicit drugs.  Allergies:  Allergies  Allergen Reactions  . Gabapentin Other (See Comments)    "makes me loopy"  . Ibuprofen Rash    Hives   . Tylenol (Acetaminophen) Rash    hives    Medications Prior to Admission  Medication Sig Dispense Refill  . ALPRAZolam (XANAX) 0.25 MG tablet Take 0.25 mg by mouth every 8 (eight) hours as needed. For nerves      . calcium carbonate (OS-CAL - DOSED IN MG OF ELEMENTAL CALCIUM) 1250 MG tablet Take 1 tablet by mouth 2 (two) times daily.      . celecoxib (CELEBREX) 200 MG capsule Take 200-400 mg by mouth 2 (two) times daily as needed. For pain and inflammation.      . cyclobenzaprine (FLEXERIL) 10 MG tablet Take 10 mg by mouth 3 (three) times daily as needed. For muscle spasms.      . hydrochlorothiazide (HYDRODIURIL) 50 MG tablet Take 50 mg by mouth daily.      . meclizine (ANTIVERT) 25 MG tablet Take 25 mg by mouth every 6 (six) hours as needed. For dizziness.      . metoprolol (LOPRESSOR) 50 MG tablet  Take 50 mg by mouth 2 (two) times daily.      Marland Kitchen oxycodone (ROXICODONE) 30 MG immediate release tablet Take 30 mg by mouth every 4 (four) hours as needed. For pain.      . potassium chloride SA (K-DUR,KLOR-CON) 20 MEQ tablet Take 20 mEq by mouth daily.      . pregabalin (LYRICA) 200 MG capsule Take 200-400 mg by mouth 2 (two) times daily. Take 200mg  every morning and take 400mg  every night at bedtime.      Marland Kitchen tiZANidine (ZANAFLEX) 4 MG tablet Take 0.5-1 mg by mouth 4 (four) times daily.      Marland Kitchen topiramate (TOPAMAX) 25 MG capsule Take 75 mg by mouth 3 (three) times daily.       . traMADol (ULTRAM) 50 MG tablet Take 50 mg by mouth 3 (three) times daily. Take every day per daughter      . venlafaxine (EFFEXOR) 75 MG tablet Take 112.5 mg by mouth 2 (two) times daily.       . Omega-3 Fatty Acids (FISH OIL) 1000 MG CAPS Take 2,000 mg by mouth daily.        No results found for this or any previous visit (from the  past 48 hour(s)). No results found.  Review of Systems  Constitutional: Negative.   HENT: Negative.   Eyes: Negative.   Respiratory: Negative.   Cardiovascular: Negative.   Gastrointestinal: Negative.   Genitourinary: Negative.   Musculoskeletal: Negative.   Skin: Negative.   Neurological: Negative.   Endo/Heme/Allergies: Negative.   Psychiatric/Behavioral: Negative.     Blood pressure 82/53, pulse 60, temperature 97.5 F (36.4 C), temperature source Oral, resp. rate 16, SpO2 95.00%. Physical Exam  Constitutional: He is oriented to person, place, and time. He appears well-developed and well-nourished.  HENT:  Head: Normocephalic and atraumatic.  Right Ear: External ear normal.  Left Ear: External ear normal.  Nose: Nose normal.  Mouth/Throat: Oropharynx is clear and moist.  Eyes: Conjunctivae and EOM are normal. Pupils are equal, round, and reactive to light. Right eye exhibits no discharge. Left eye exhibits no discharge.  Neck: Normal range of motion. Neck supple. No  tracheal deviation present. No thyromegaly present.  Cardiovascular: Normal rate, regular rhythm, normal heart sounds and intact distal pulses.   Respiratory: Effort normal and breath sounds normal. No respiratory distress. He has no wheezes.  GI: Soft. Bowel sounds are normal. He exhibits no distension. There is no tenderness.  Musculoskeletal: Normal range of motion. He exhibits no edema and no tenderness.  Neurological: He is alert and oriented to person, place, and time. He has normal reflexes. He displays normal reflexes. No cranial nerve deficit. He exhibits normal muscle tone. Coordination normal.  Skin: Skin is warm and dry. No rash noted. No erythema. No pallor.  Psychiatric: He has a normal mood and affect. His behavior is normal. Judgment and thought content normal.     Assessment/Plan L1 to pseudarthrosis with hardware failure. Plan for reexploration lumbar fusion with revision lumbar fusion L1-2 and replacement of hardware. Risks and benefits been explained. Patient wishes to proceed.  [name] A 01/08/2012, 7:43 AM

## 2012-01-08 NOTE — Brief Op Note (Signed)
01/08/2012  9:08 AM  PATIENT:  [name]  71 y.o. male  PRE-OPERATIVE DIAGNOSIS:  pseudoarthrosis  POST-OPERATIVE DIAGNOSIS:  pseudoarthrosis  PROCEDURE:  Procedure(s) (LRB): POSTERIOR LUMBAR FUSION 1 LEVEL (N/A)  SURGEON:  Surgeon(s) and Role:    * Temple Pacini, MD - Primary  PHYSICIAN ASSISTANT:   ASSISTANTS:    ANESTHESIA:   general  EBL:  Total I/O In: 1000 [I.V.:1000] Out: 200 [Blood:200]  BLOOD ADMINISTERED:none  DRAINS: (Medium) Hemovact drain(s) in the Epidural space with  Suction Open   LOCAL MEDICATIONS USED:  MARCAINE     SPECIMEN:  No Specimen  DISPOSITION OF SPECIMEN:  N/A  COUNTS:  YES  TOURNIQUET:  * No tourniquets in log *  DICTATION: .Dragon Dictation  PLAN OF CARE: Admit to inpatient   PATIENT DISPOSITION:  PACU - hemodynamically stable.   Delay start of Pharmacological VTE agent (>24hrs) due to surgical blood loss or risk of bleeding: yes

## 2012-01-08 NOTE — Anesthesia Procedure Notes (Signed)
Procedure Name: Intubation Date/Time: 01/08/2012 7:53 AM Performed by: Ellin Goodie Pre-anesthesia Checklist: Patient identified, Emergency Drugs available, Suction available, Patient being monitored and Timeout performed Patient Re-evaluated:Patient Re-evaluated prior to inductionOxygen Delivery Method: Circle system utilized Preoxygenation: Pre-oxygenation with 100% oxygen Intubation Type: IV induction Ventilation: Mask ventilation without difficulty Laryngoscope Size: Mac and 4 Grade View: Grade III Tube type: Oral Tube size: 7.5 mm Number of attempts: 1 Airway Equipment and Method: Stylet Placement Confirmation: positive ETCO2 and breath sounds checked- equal and bilateral Secured at: 23 cm Tube secured with: Tape Dental Injury: Teeth and Oropharynx as per pre-operative assessment

## 2012-01-09 MED ORDER — CYCLOBENZAPRINE HCL 10 MG PO TABS: 10.0000 mg | ORAL_TABLET | Freq: Three times a day (TID) | ORAL | Status: AC | PRN

## 2012-01-09 MED ORDER — HYDROCODONE-ACETAMINOPHEN 5-325 MG PO TABS: 1.0000 | ORAL_TABLET | ORAL | Status: AC | PRN

## 2012-01-09 NOTE — Progress Notes (Signed)
Pt discharged home with family via wheelchair.  Discharge instructions were given along with prescriptions.  Pt follows up with pain clinic and will not be getting Vicodin filled however when he needs the felxeril he will get that filled.  Pt stable.  No other needs at this time  Allyn Kenner 01/09/2012 12:48 PM

## 2012-01-09 NOTE — Evaluation (Signed)
Occupational Therapy Evaluation Patient Details Name: [name] MRN: [id number] DOB: 1941-02-26 Today's Date: 01/09/2012 Time: 7846-9629 OT Time Calculation (min): 14 min  OT Assessment / Plan / Recommendation Clinical Impression  Pt s/p 8th back sx and declines further therapy at this time (pt is to d/c today). Pt and family state they have all necessary assist and equipment for home. No further acute OT needs indicated- will sign off.    OT Assessment  Patient does not need any further OT services    Follow Up Recommendations  No OT follow up    Barriers to Discharge      Equipment Recommendations  None recommended by OT    Recommendations for Other Services    Frequency       Precautions / Restrictions Precautions Precautions: Back Precaution Booklet Issued: Yes (comment) Precaution Comments: Pt needed almost constant reminders to maintain back precautions and needed verbal cueing to put the brace on while in sitting before standing. Pt given handout with back precautions and was able to verbalize 2/3 precautions at the end of the session. Required Braces or Orthoses: Spinal Brace Spinal Brace: Lumbar corset;Applied in sitting position   Pertinent Vitals/Pain Pt reports 5/10 back pain; RN aware; repositioned pt    ADL  Grooming: Performed;Minimal assistance Where Assessed - Grooming: Supported standing Toilet Transfer: Performed;Minimal Dentist Method: Sit to Barista: Regular height toilet;Grab bars Toileting - Clothing Manipulation and Hygiene: Performed;Maximal assistance Where Assessed - Engineer, mining and Hygiene: Standing Equipment Used: Back brace;Rolling walker;Gait belt Transfers/Ambulation Related to ADLs: Ambulates with kyphotic posture despite VC and physical facilitation for upright standing; encouraged pt to stand upright to protect back. Pt ambulated with Min A with RW placing a significant  about of weight through UE's and keeping neck flexed which inc risk for injury ADL Comments: Pt (and family there to verify) states that he has all needed equipment (3n1; shower seat; and AE) for use at home and he and his family feel comfortable using it. Pt also states he does not want HHOT/PT as "his doctor" feels the cause of two back surgeries were injuries incurred with HHPT    OT Diagnosis:    OT Problem List:   OT Treatment Interventions:     OT Goals    Visit Information  Last OT Received On: 01/09/12 Assistance Needed: +1    Subjective Data  Subjective: I'm ready to go home Patient Stated Goal: Return home   Prior Functioning  Vision/Perception  Home Living Lives With: Spouse;Son Available Help at Discharge: Family;Available 24 hours/day Type of Home: House Home Access: Stairs to enter [employer] of Steps: 4 Entrance Stairs-Rails: None Home Layout: One level Bathroom Shower/Tub: Tub only Bathroom Toilet: Handicapped height Home Adaptive Equipment: Grab bars in shower;Shower chair with back;Grab bars around toilet;Walker - rolling Additional Comments: pt does sponge bath sitting in the tub, uses a RW at home all the  Prior Function Level of Independence: Independent with assistive device(s) (uses RW) Driving: Yes Vocation: Retired Musician: No difficulties Dominant Hand: Right      Cognition  Overall Cognitive Status: Impaired Area of Impairment: Safety/judgement;Attention;Awareness of deficits Arousal/Alertness: Awake/alert Orientation Level: Appears intact for tasks assessed Behavior During Session: [facility] for tasks performed Current Attention Level: Selective Cognition - Other Comments: Pt has decreased safety awareness and is impulsive.    Extremity/Trunk Assessment Right Upper Extremity Assessment RUE ROM/Strength/Tone: [facility] for tasks assessed Left Upper Extremity Assessment LUE ROM/Strength/Tone: [facility]  for tasks  assessed Right Lower Extremity Assessment RLE ROM/Strength/Tone: [facility]  for tasks assessed Left Lower Extremity Assessment LLE ROM/Strength/Tone: [employer] [facility] for tasks assessed Trunk Assessment Trunk Assessment: Kyphotic (reduced lumbar curvature.)   Mobility Bed Mobility Details for Bed Mobility Assistance: Pt in chair upon presentation. Pt reports no problems with bed mobility and that son is there to help if needed. Transfers Sit to Stand: 4: Min assist;From toilet Stand to Sit: 4: Min assist;To chair/3-in-1 Details for Transfer Assistance: VC to square up with surface prior to sitting    Exercise    Balance    End of Session OT - End of Session Equipment Utilized During Treatment: Gait belt;Back brace Activity Tolerance: Patient limited by fatigue Patient left: in chair;with call bell/phone within reach;with family/visitor present Nurse Communication: Mobility status  GO     [name] 01/09/2012, 2:11 PM

## 2012-01-09 NOTE — Care Management Note (Signed)
    Page 1 of 1   01/09/2012     12:27:08 PM   CARE MANAGEMENT NOTE 01/09/2012  Patient:  [name], [name]   Account Number:  [id number]  Date Initiated:  01/09/2012  Documentation initiated by:  [facility]  Subjective/Objective Assessment:   Admitted post op L1-2 hardware revision  Lives with spouse.     Action/Plan:   PT eval   Anticipated DC Date:  01/09/2012   Anticipated DC Plan:  HOME/SELF CARE      DC Planning Services  CM consult      Choice offered to / List presented to:             Status of service:  Completed, signed off Medicare Important Message given?   (If response is "NO", the following Medicare IM given date fields will be blank) Date Medicare IM given:   Date Additional Medicare IM given:    Discharge Disposition:  HOME/SELF CARE  Per UR Regulation:  Reviewed for med. necessity/level of care/duration of stay  If discussed at Long Length of Stay Meetings, dates discussed:    Comments:  01/09/12 Spoke with patient and his wife about d/c plans. Patient states that he does not want any HHPT or OT. He states that he walked with PT and did well this am and has had HHC in the past. Patient states that he has a rolling walker, tub bench, pincher for picking up items and a raised toilet seat. No equipment needs identified.Spouse and son available to assist patient as needed. No d/c needs identified. Jacquelynn Cree RN, BSN, CCM

## 2012-01-09 NOTE — Discharge Summary (Signed)
Physician Discharge Summary  Patient ID: [name] MRN: [id number] DOB/AGE: Mar 17, 1941 71 y.o.  Admit date: 01/08/2012 Discharge date: 01/09/2012  Admission Diagnoses:  Discharge Diagnoses:  Principal Problem:  *Lumbar pseudoarthrosis   Discharged Condition: good  Hospital Course: Admitted to the hospital for treatment of a symptomatic L1 to pseudoarthrosis. Patient underwent uncomplicated L1-2 revision fusion. Postoperatively he is done well. Ready for discharge home. Consults:   Significant Diagnostic Studies:   Treatments:   Discharge Exam: Blood pressure 105/52, pulse 76, temperature 97.8 F (36.6 C), temperature source Oral, resp. rate 18, SpO2 100.00%. Awake and alert oriented and appropriate her cranial nerve function is intact. Motor and sensory function extremities normal. Wound healing well. Chest and abdomen benign. Plan discharge home.  Disposition: 06-Home-Health Care Svc   Medication List  As of 01/09/2012 10:32 AM   TAKE these medications         ALPRAZolam 0.25 MG tablet   Commonly known as: XANAX   Take 0.25 mg by mouth every 8 (eight) hours as needed. For nerves      calcium carbonate 1250 MG tablet   Commonly known as: OS-CAL - dosed in mg of elemental calcium   Take 1 tablet by mouth 2 (two) times daily.      celecoxib 200 MG capsule   Commonly known as: CELEBREX   Take 200-400 mg by mouth 2 (two) times daily as needed. For pain and inflammation.      cyclobenzaprine 10 MG tablet   Commonly known as: FLEXERIL   Take 10 mg by mouth 3 (three) times daily as needed. For muscle spasms.      cyclobenzaprine 10 MG tablet   Commonly known as: FLEXERIL   Take 1 tablet (10 mg total) by mouth 3 (three) times daily as needed for muscle spasms.      Fish Oil 1000 MG Caps   Take 2,000 mg by mouth daily.      hydrochlorothiazide 50 MG tablet   Commonly known as: HYDRODIURIL   Take 50 mg by mouth daily.      HYDROcodone-acetaminophen 5-325 MG per  tablet   Commonly known as: NORCO/VICODIN   Take 1-2 tablets by mouth every 4 (four) hours as needed.      meclizine 25 MG tablet   Commonly known as: ANTIVERT   Take 25 mg by mouth every 6 (six) hours as needed. For dizziness.      metoprolol 50 MG tablet   Commonly known as: LOPRESSOR   Take 50 mg by mouth 2 (two) times daily.      oxycodone 30 MG immediate release tablet   Commonly known as: ROXICODONE   Take 30 mg by mouth every 4 (four) hours as needed. For pain.      potassium chloride SA 20 MEQ tablet   Commonly known as: K-DUR,KLOR-CON   Take 20 mEq by mouth daily.      pregabalin 200 MG capsule   Commonly known as: LYRICA   Take 200-400 mg by mouth 2 (two) times daily. Take 200mg  every morning and take 400mg  every night at bedtime.      tiZANidine 4 MG tablet   Commonly known as: ZANAFLEX   Take 0.5-1 mg by mouth 4 (four) times daily.      topiramate 25 MG capsule   Commonly known as: TOPAMAX   Take 75 mg by mouth 3 (three) times daily.      traMADol 50 MG tablet   Commonly known as: Janean Sark  Take 50 mg by mouth 3 (three) times daily. Take every day per daughter      venlafaxine 75 MG tablet   Commonly known as: EFFEXOR   Take 112.5 mg by mouth 2 (two) times daily.           Follow-up Information    Follow up with Aaleyah Witherow A, MD. Call in 1 week. (Ask for Lurena Joiner)    Contact information:   [address] [place] [address] [phone]          Signed: Temple Pacini 01/09/2012, 10:32 AM

## 2012-01-10 MED FILL — Sodium Chloride Irrigation Soln 0.9%: Qty: 3000 | Status: AC

## 2012-01-10 MED FILL — Heparin Sodium (Porcine) Inj 1000 Unit/ML: INTRAMUSCULAR | Qty: 30 | Status: AC

## 2012-01-10 MED FILL — Sodium Chloride IV Soln 0.9%: INTRAVENOUS | Qty: 1000 | Status: AC

## 2012-06-25 ENCOUNTER — Other Ambulatory Visit: Payer: Self-pay | Admitting: Neurosurgery

## 2012-06-25 DIAGNOSIS — M4802 Spinal stenosis, cervical region: Secondary | ICD-10-CM

## 2012-07-10 ENCOUNTER — Ambulatory Visit
Admission: RE | Admit: 2012-07-10 | Discharge: 2012-07-10 | Disposition: A | Discharge status: Home / Self Care | Payer: Medicare Other | Source: Ambulatory Visit | Attending: Neurosurgery | Admitting: Neurosurgery

## 2012-07-10 DIAGNOSIS — M4802 Spinal stenosis, cervical region: Secondary | ICD-10-CM

## 2015-08-26 ENCOUNTER — Encounter (HOSPITAL_COMMUNITY): Payer: Self-pay

## 2015-08-26 ENCOUNTER — Emergency Department (HOSPITAL_COMMUNITY): Payer: Medicare HMO

## 2015-08-26 ENCOUNTER — Inpatient Hospital Stay (HOSPITAL_COMMUNITY)
Admission: EM | Admit: 2015-08-26 | Discharge: 2015-08-31 | DRG: 693 | Disposition: A | Discharge status: Home / Self Care | Payer: Medicare HMO | Attending: Internal Medicine | Admitting: Internal Medicine

## 2015-08-26 DIAGNOSIS — J9601 Acute respiratory failure with hypoxia: Secondary | ICD-10-CM

## 2015-08-26 DIAGNOSIS — R7989 Other specified abnormal findings of blood chemistry: Secondary | ICD-10-CM

## 2015-08-26 DIAGNOSIS — F329 Major depressive disorder, single episode, unspecified: Secondary | ICD-10-CM | POA: Diagnosis present

## 2015-08-26 DIAGNOSIS — M4326 Fusion of spine, lumbar region: Secondary | ICD-10-CM | POA: Diagnosis present

## 2015-08-26 DIAGNOSIS — M549 Dorsalgia, unspecified: Secondary | ICD-10-CM

## 2015-08-26 DIAGNOSIS — N132 Hydronephrosis with renal and ureteral calculous obstruction: Principal | ICD-10-CM | POA: Diagnosis present

## 2015-08-26 DIAGNOSIS — Z978 Presence of other specified devices: Secondary | ICD-10-CM

## 2015-08-26 DIAGNOSIS — J9600 Acute respiratory failure, unspecified whether with hypoxia or hypercapnia: Secondary | ICD-10-CM

## 2015-08-26 DIAGNOSIS — N209 Urinary calculus, unspecified: Secondary | ICD-10-CM | POA: Diagnosis present

## 2015-08-26 DIAGNOSIS — I4891 Unspecified atrial fibrillation: Secondary | ICD-10-CM | POA: Diagnosis present

## 2015-08-26 DIAGNOSIS — S32009K Unspecified fracture of unspecified lumbar vertebra, subsequent encounter for fracture with nonunion: Secondary | ICD-10-CM | POA: Diagnosis present

## 2015-08-26 DIAGNOSIS — R6520 Severe sepsis without septic shock: Secondary | ICD-10-CM | POA: Diagnosis not present

## 2015-08-26 DIAGNOSIS — A419 Sepsis, unspecified organism: Secondary | ICD-10-CM | POA: Diagnosis not present

## 2015-08-26 DIAGNOSIS — Z4659 Encounter for fitting and adjustment of other gastrointestinal appliance and device: Secondary | ICD-10-CM

## 2015-08-26 DIAGNOSIS — G8929 Other chronic pain: Secondary | ICD-10-CM | POA: Diagnosis present

## 2015-08-26 DIAGNOSIS — Z79899 Other long term (current) drug therapy: Secondary | ICD-10-CM

## 2015-08-26 DIAGNOSIS — F32A Depression, unspecified: Secondary | ICD-10-CM | POA: Diagnosis present

## 2015-08-26 DIAGNOSIS — R109 Unspecified abdominal pain: Secondary | ICD-10-CM | POA: Diagnosis not present

## 2015-08-26 DIAGNOSIS — R778 Other specified abnormalities of plasma proteins: Secondary | ICD-10-CM | POA: Diagnosis present

## 2015-08-26 DIAGNOSIS — I10 Essential (primary) hypertension: Secondary | ICD-10-CM | POA: Diagnosis present

## 2015-08-26 DIAGNOSIS — E876 Hypokalemia: Secondary | ICD-10-CM | POA: Diagnosis present

## 2015-08-26 DIAGNOSIS — G4733 Obstructive sleep apnea (adult) (pediatric): Secondary | ICD-10-CM | POA: Diagnosis present

## 2015-08-26 DIAGNOSIS — W19XXXA Unspecified fall, initial encounter: Secondary | ICD-10-CM | POA: Diagnosis present

## 2015-08-26 DIAGNOSIS — N200 Calculus of kidney: Secondary | ICD-10-CM

## 2015-08-26 DIAGNOSIS — A4901 Methicillin susceptible Staphylococcus aureus infection, unspecified site: Secondary | ICD-10-CM | POA: Diagnosis present

## 2015-08-26 DIAGNOSIS — J95821 Acute postprocedural respiratory failure: Secondary | ICD-10-CM | POA: Diagnosis not present

## 2015-08-26 LAB — URINALYSIS, ROUTINE W REFLEX MICROSCOPIC
BILIRUBIN URINE: NEGATIVE
Glucose, UA: NEGATIVE mg/dL
HGB URINE DIPSTICK: NEGATIVE
KETONES UR: 15 mg/dL — AB
Leukocytes, UA: NEGATIVE
NITRITE: NEGATIVE
PH: 6 (ref 5.0–8.0)
Protein, ur: 100 mg/dL — AB
SPECIFIC GRAVITY, URINE: 1.025 (ref 1.005–1.030)

## 2015-08-26 LAB — CK: CK TOTAL: 64 U/L (ref 49–397)

## 2015-08-26 LAB — CBC WITH DIFFERENTIAL/PLATELET
Basophils Absolute: 0 10*3/uL (ref 0.0–0.1)
Basophils Relative: 0 %
Eosinophils Absolute: 0 10*3/uL (ref 0.0–0.7)
Eosinophils Relative: 0 %
HEMATOCRIT: 43.1 % (ref 39.0–52.0)
HEMOGLOBIN: 14.1 g/dL (ref 13.0–17.0)
LYMPHS ABS: 0.5 10*3/uL — AB (ref 0.7–4.0)
LYMPHS PCT: 4 %
MCH: 28.7 pg (ref 26.0–34.0)
MCHC: 32.7 g/dL (ref 30.0–36.0)
MCV: 87.8 fL (ref 78.0–100.0)
MONOS PCT: 11 %
Monocytes Absolute: 1.3 10*3/uL — ABNORMAL HIGH (ref 0.1–1.0)
NEUTROS ABS: 10.1 10*3/uL — AB (ref 1.7–7.7)
NEUTROS PCT: 85 %
Platelets: 132 10*3/uL — ABNORMAL LOW (ref 150–400)
RBC: 4.91 MIL/uL (ref 4.22–5.81)
RDW: 14.1 % (ref 11.5–15.5)
WBC: 11.8 10*3/uL — ABNORMAL HIGH (ref 4.0–10.5)

## 2015-08-26 LAB — COMPREHENSIVE METABOLIC PANEL
ALK PHOS: 80 U/L (ref 38–126)
ALT: 14 U/L — AB (ref 17–63)
ANION GAP: 13 (ref 5–15)
AST: 21 U/L (ref 15–41)
Albumin: 3.4 g/dL — ABNORMAL LOW (ref 3.5–5.0)
BILIRUBIN TOTAL: 1 mg/dL (ref 0.3–1.2)
BUN: 22 mg/dL — ABNORMAL HIGH (ref 6–20)
CALCIUM: 9.5 mg/dL (ref 8.9–10.3)
CO2: 25 mmol/L (ref 22–32)
CREATININE: 0.82 mg/dL (ref 0.61–1.24)
Chloride: 99 mmol/L — ABNORMAL LOW (ref 101–111)
GFR calc non Af Amer: >60 mL/min (ref >60)
GLUCOSE: 161 mg/dL — AB (ref 65–99)
Potassium: 3.7 mmol/L (ref 3.5–5.1)
Sodium: 137 mmol/L (ref 135–145)
TOTAL PROTEIN: 6.7 g/dL (ref 6.5–8.1)

## 2015-08-26 LAB — URINE MICROSCOPIC-ADD ON

## 2015-08-26 LAB — PROTIME-INR
INR: 1.09 (ref 0.00–1.49)
PROTHROMBIN TIME: 14.3 s (ref 11.6–15.2)

## 2015-08-26 LAB — TROPONIN I: Troponin I: 0.04 ng/mL — ABNORMAL HIGH (ref <0.031)

## 2015-08-26 LAB — I-STAT TROPONIN, ED: Troponin i, poc: 0.02 ng/mL (ref 0.00–0.08)

## 2015-08-26 MED ORDER — HYDROMORPHONE HCL 1 MG/ML IJ SOLN
1.0000 mg | Freq: Once | INTRAMUSCULAR | Status: AC
  Administered 2015-08-26: 1 mg via INTRAVENOUS
  Filled 2015-08-26: qty 1

## 2015-08-26 MED ORDER — ACETAMINOPHEN 10 MG/ML IV SOLN
1000.0000 mg | Freq: Once | INTRAVENOUS | Status: AC
  Administered 2015-08-26: 1000 mg via INTRAVENOUS
  Filled 2015-08-26: qty 100

## 2015-08-26 MED ORDER — SODIUM CHLORIDE 0.9 % IV BOLUS (SEPSIS)
1000.0000 mL | Freq: Once | INTRAVENOUS | Status: AC
  Administered 2015-08-26: 1000 mL via INTRAVENOUS

## 2015-08-26 MED ORDER — IOHEXOL 350 MG/ML SOLN
100.0000 mL | Freq: Once | INTRAVENOUS | Status: AC | PRN
  Administered 2015-08-26: 100 mL via INTRAVENOUS

## 2015-08-26 MED ORDER — KETOROLAC TROMETHAMINE 15 MG/ML IJ SOLN
15.0000 mg | Freq: Once | INTRAMUSCULAR | Status: AC
  Administered 2015-08-26: 15 mg via INTRAVENOUS
  Filled 2015-08-26: qty 1

## 2015-08-26 NOTE — ED Notes (Signed)
Paged urology @ [phone]

## 2015-08-26 NOTE — ED Notes (Signed)
Wife states that pt is normally up and around with a walker at home, but since Monday he has not been able to. Pt slipped out of the bed around 12am -- stayed on floor until 1245 am, wife and son had to slide him into a chair.  Pt states he always sweats a lot-- pt is extremely diaphoretic.

## 2015-08-26 NOTE — ED Notes (Signed)
Pt had a fall Tuesday morning. Pt has remained at home. Pt reports left lower back and hip pain. Pt able to move but has pain. PMS intact with EMS.  190/110. HR 120. Diaphoretic/pale. 200mcg fentanyl en route. 8/10

## 2015-08-26 NOTE — ED Notes (Signed)
Lab to add on CK 

## 2015-08-26 NOTE — ED Provider Notes (Signed)
CSN: [id number]     Arrival date & time 08/26/15  1721 History   First MD Initiated Contact with Patient 08/26/15 1807     Chief Complaint  Patient presents with  . Hip Pain     (Consider location/radiation/quality/duration/timing/severity/associated sxs/prior Treatment) Patient is a 75 y.o. male presenting with hip pain. The history is provided by the patient and the spouse.  Hip Pain This is a new problem. Episode onset: 4 days. The problem occurs constantly. The problem has been gradually worsening. Associated symptoms include abdominal pain (left flank pain) and arthralgias. Pertinent negatives include no chest pain, diaphoresis, fever, headaches, myalgias, nausea, rash, sore throat, vomiting or weakness. The symptoms are aggravated by twisting.    Past Medical History  Diagnosis Date  . Sleep apnea   . Hypertension   . Depression   . Arthritis   . Urinary frequency   . Lumbar neuralgia   . Headache(784.0)   . Constipation due to pain medication   . Hepatitis   . Hepatitis B     "years ago"   Past Surgical History  Procedure Laterality Date  . Back surgery      7 lumbar surgeries  . Appendectomy    . Joint replacement      Rt shoulder   History reviewed. No pertinent family history. Social History  Substance Use Topics  . Smoking status: Never Smoker   . Smokeless tobacco: Current User    Types: Snuff  . Alcohol Use: No    Review of Systems  Constitutional: Negative for fever, diaphoresis, activity change and appetite change.  HENT: Negative for facial swelling, sore throat, tinnitus, trouble swallowing and voice change.   Eyes: Negative for pain, redness and visual disturbance.  Respiratory: Negative for chest tightness, shortness of breath and wheezing.   Cardiovascular: Negative for chest pain, palpitations and leg swelling.  Gastrointestinal: Positive for abdominal pain (left flank pain). Negative for nausea, vomiting, diarrhea, constipation and abdominal  distention.  Endocrine: Negative.   Genitourinary: Positive for flank pain. Negative for dysuria, decreased urine volume, scrotal swelling and testicular pain.  Musculoskeletal: Positive for back pain and arthralgias. Negative for myalgias and gait problem.  Skin: Negative.  Negative for rash.  Neurological: Negative.  Negative for dizziness, tremors, weakness and headaches.  Psychiatric/Behavioral: Negative for suicidal ideas, hallucinations and self-injury. The patient is not nervous/anxious.       Allergies  Gabapentin and Ibuprofen  Home Medications   Prior to Admission medications   Medication Sig Start Date End Date Taking? Authorizing Provider  celecoxib (CELEBREX) 100 MG capsule Take 100 mg by mouth 2 (two) times daily.   Yes Historical Provider, MD  Cholecalciferol (VITAMIN D) 2000 units tablet Take 2,000 Units by mouth daily.   Yes Historical Provider, MD  ciprofloxacin (CIPRO) 500 MG tablet Take 500 mg by mouth 2 (two) times daily.   Yes Historical Provider, MD  DULoxetine (CYMBALTA) 30 MG capsule Take 30 mg by mouth daily. Take with 60 mg = 90 mg total   Yes Historical Provider, MD  DULoxetine (CYMBALTA) 60 MG capsule Take 60 mg by mouth daily. Take with 30 mg = 90 mg total   Yes Historical Provider, MD  HYDROcodone-acetaminophen (NORCO) 10-325 MG tablet Take 1 tablet by mouth every 6 (six) hours as needed for moderate pain.   Yes Historical Provider, MD  tamsulosin (FLOMAX) 0.4 MG CAPS capsule Take 0.4 mg by mouth at bedtime.   Yes Historical Provider, MD   BP 149/106  mmHg  Pulse 103  Resp 16  SpO2 96% Physical Exam  Constitutional: He is oriented to person, place, and time. He appears well-developed and well-nourished. No distress.  HENT:  Head: Normocephalic and atraumatic.  Right Ear: External ear normal.  Left Ear: External ear normal.  Nose: Nose normal.  Mouth/Throat: Oropharynx is clear and moist.  Eyes: Conjunctivae and EOM are normal. Pupils are equal,  round, and reactive to light. No scleral icterus.  Neck: Normal range of motion. Neck supple. No JVD present. No tracheal deviation present. No thyromegaly present.  Cardiovascular: Normal rate.   Pulmonary/Chest: Effort normal and breath sounds normal. No stridor. No respiratory distress. He has no wheezes. He has no rales.  Abdominal: Soft. He exhibits no distension. There is tenderness (left CVA tenderness). There is no rebound and no guarding.  Musculoskeletal: Normal range of motion. He exhibits tenderness (left hip tenderness). He exhibits no edema.  Neurological: He is alert and oriented to person, place, and time. No cranial nerve deficit. He exhibits normal muscle tone. Coordination normal.  Skin: Skin is warm and dry. No rash noted. He is not diaphoretic.  Partial thickness burn on left flank 0.1%BSA  Psychiatric: He has a normal mood and affect. His behavior is normal.  Nursing note and vitals reviewed.   ED Course  Procedures (including critical care time) Labs Review Labs Reviewed  CBC WITH DIFFERENTIAL/PLATELET - Abnormal; Notable for the following:    WBC 11.8 (*)    Platelets 132 (*)    Neutro Abs 10.1 (*)    Lymphs Abs 0.5 (*)    Monocytes Absolute 1.3 (*)    All other components within normal limits  COMPREHENSIVE METABOLIC PANEL - Abnormal; Notable for the following:    Chloride 99 (*)    Glucose, Bld 161 (*)    BUN 22 (*)    Albumin 3.4 (*)    ALT 14 (*)    All other components within normal limits  TROPONIN I - Abnormal; Notable for the following:    Troponin I 0.04 (*)    All other components within normal limits  URINALYSIS, ROUTINE W REFLEX MICROSCOPIC (NOT AT [facility]) - Abnormal; Notable for the following:    Color, Urine AMBER (*)    Ketones, ur 15 (*)    Protein, ur 100 (*)    All other components within normal limits  URINE MICROSCOPIC-ADD ON - Abnormal; Notable for the following:    Squamous Epithelial / LPF 0-5 (*)    Bacteria, UA RARE (*)    All  other components within normal limits  PROTIME-INR  CK  TROPONIN I  TROPONIN I  TROPONIN I  MAGNESIUM  PHOSPHORUS  I-STAT TROPOININ, ED    Imaging Review Dg Chest 1 View  08/26/2015  CLINICAL DATA:  Fall 2 days prior, with back and left hip pain. EXAM: CHEST 1 VIEW COMPARISON:  Radiograph 01/03/2012 FINDINGS: Lung volumes are low leading to accentuation of the cardiac size and crowding of bronchovascular structures. No confluent airspace disease, large pleural effusion or pneumothorax. No acute osseous abnormalities are seen. IMPRESSION: Hypoventilatory chest accentuating cardiac size and crowding of bronchovascular structures. Recommend PA and lateral views when patient is able. Electronically Signed   By: Rubye OaksMelanie  Ehinger M.D.   On: 08/26/2015 19:27   Dg Lumbar Spine Complete  08/26/2015  CLINICAL DATA:  Fall tuesday evening EXAM: LUMBAR SPINE - COMPLETE 4+ VIEW COMPARISON:  08/10/05 FINDINGS: Prior L1/2 fusion with hardware. Normal alignment. No acute fracture.  Diffuse osseous fusion lumbar spine IMPRESSION: No acute findings. Electronically Signed   By: Esperanza Heir M.D.   On: 08/26/2015 19:27   Dg Hip Unilat With Pelvis 2-3 Views Left  08/26/2015  CLINICAL DATA:  Fall 2 days prior, persistent left hip pain. EXAM: DG HIP (WITH OR WITHOUT PELVIS) 2-3V LEFT COMPARISON:  None. FINDINGS: The cortical margins of the bony pelvis are intact. No fracture. Pubic symphysis and sacroiliac joints are congruent. Mild degenerative change of both hips. Both femoral heads are well-seated in the respective acetabula. IMPRESSION: No pelvic or left hip fracture. Electronically Signed   By: Rubye Oaks M.D.   On: 08/26/2015 19:25   Ct Cta Abd/pel W/cm &/or W/o Cm  08/26/2015  CLINICAL DATA:  Recent fall with extreme left-sided pain radiating to the back. Clinical concern for abdominal aortic aneurysm or dissection. EXAM: CTA ABDOMEN AND PELVIS wITHOUT AND WITH CONTRAST TECHNIQUE: Multidetector CT imaging of  the abdomen and pelvis was performed using the standard protocol during bolus administration of intravenous contrast. Multiplanar reconstructed images and MIPs were obtained and reviewed to evaluate the vascular anatomy. CONTRAST:  100 cc Omnipaque 350 IV COMPARISON:  Lumbar spine and hip radiographs earlier this day. FINDINGS: Lower chest: Atelectasis in the dependent right greater than left lower lobe. Heart upper limits of normal in size. Coronary artery calcifications. No pleural effusion. Liver: No acute traumatic injury. Prominent size. No evidence of focal lesion allowing for noncontrast and arterial phase imaging. Hepatobiliary: Distended gallbladder containing intraluminal gallstone. No pericholecystic inflammation. Prominence of the common bile duct measures 10 mm at the porta hepatis. Pancreas: Mild fatty atrophy. No ductal dilatation or inflammation. No evidence of traumatic injury. Spleen: Normal for arterial phase imaging. No perisplenic fluid. Spleen is enlarged measuring 15.7 cm cranial caudal. Adrenal glands: No nodule or hemorrhage. Kidneys: Symmetric renal enhancement. No hydronephrosis. No significant perinephric stranding. Nonobstructing bilateral renal stones. There are 2 adjacent 8 mm stones in the distal left ureter at the ureterovesicular junction, no ureteral dilatation. Bilateral renal cysts. There is cortical scarring in the left kidney. Stomach/Bowel: Stomach is decompressed. There are no dilated or thickened small bowel loops. Cecum is medially displaced in the mid abdomen with prominent stool. Moderate stool throughout the remainder the colon. Distal colonic diverticulosis without diverticulitis. The appendix is not visualized. Vascular/Lymphatic: Normal caliber abdominal aorta without aneurysm or dissection. Mild tortuosity and atherosclerosis. No aortic hematoma, soft tissue stranding, or acute aortic abnormality. Celiac, superior and inferior mesenteric arteries are patent. Single  bilateral renal arteries are patent. Tortuosity of both common and external iliac arteries without aneurysmal dilatation. No retroperitoneal fluid. No retroperitoneal adenopathy. Reproductive: Normal sized prostate gland. Bladder: Minimally distended, no wall thickening. Other: No free air, free fluid, or intra-abdominal fluid collection. Tiny fat containing umbilical hernia. Musculoskeletal: There are no acute or suspicious osseous abnormalities. Extensive multilevel postsurgical change in the spine. Posterior hardware at L1-2 with rod and intrapedicular screws, the pedicular screws abut the T12-L1 disc space. No evidence of acute fracture allowing for degree of background chronic change. No fracture of the bony pelvis or hips. Probable bone harvest in the right iliac bone. Review of the MIP images confirms the above findings. IMPRESSION: 1. Normal caliber abdominal aorta without acute aortic abnormality or aortic aneurysm. 2. No evidence of acute traumatic abnormality in the abdomen/pelvis. 3. There are 2 adjacent distal left ureteric stones both measuring 8 mm. No proximal ureteral dilatation or frank hydronephrosis, however these may be intermittently obstructing and cause  of patient's left-sided pain. There are nonobstructing stones in both kidneys. Electronically Signed   By: Rubye Oaks M.D.   On: 08/26/2015 20:39   I have personally reviewed and evaluated these images and lab results as part of my medical decision-making.   EKG Interpretation   Date/Time:  Thursday August 26 2015 17:56:47 EST Ventricular Rate:  114 PR Interval:    QRS Duration: 98 QT Interval:  342 QTC Calculation: 471 R Axis:   10 Text Interpretation:  Atrial flutter with variable A-V block with  premature ventricular or aberrantly conducted complexes Abnormal ECG now  atrial flutter Confirmed by Manus Gunning  MD, Jeannett Senior [phone]) on 08/26/2015  7:01:16 PM      MDM   Final diagnoses:  Fall  Left nephrolithiasis  Atrial  fibrillation, unspecified type [facility])    The patient is a 75 year old male who presents with his wife for 4 days of left-sided back and flank pain. Patient denies any traumatic mechanisms but reports the pain started suddenly 4 days ago with walking and was so bad it caused him to ease himself to the ground. Patient has been using a heating pad for pain without relief. Physical exam as above showing left-sided CVA tenderness. Patient has partial-thickness burn to the left paraspinal lumbar spine at approximately 0.1% body surface area. Burn appears to be from heating pad. Patient is afebrile hematemesis stable. Physical exam as above. Workup as above showing two large 8 mm stones and left distal ureter. Multiple rounds of pain medicine are given with minimal to no relief. Urology is consulted and requests IV acetaminophen given. Per their request this medication is attempted with only minimal improvement in pain. Patient family still report patient feels too uncomfortable to go home at this time. Patient admitted to the hospitalist service for management of intractable kidney pain with urology following along for planned operative intervention. Patient and family expressed understanding and agreement with this plan.  Patient seen with attending, Dr. Manus Gunning, who oversaw clinical decision making.     Lula Olszewski, MD 08/27/15 6045  Glynn Octave, MD 08/27/15 [phone]

## 2015-08-26 NOTE — ED Notes (Signed)
Pt transported to xray 

## 2015-08-27 ENCOUNTER — Encounter (HOSPITAL_COMMUNITY): Payer: Self-pay | Admitting: Internal Medicine

## 2015-08-27 ENCOUNTER — Inpatient Hospital Stay (HOSPITAL_COMMUNITY): Payer: Medicare HMO

## 2015-08-27 ENCOUNTER — Inpatient Hospital Stay (HOSPITAL_COMMUNITY): Payer: Medicare HMO | Admitting: Anesthesiology

## 2015-08-27 ENCOUNTER — Encounter (HOSPITAL_COMMUNITY)
Admission: EM | Disposition: A | Discharge status: Home / Self Care | Payer: Self-pay | Source: Home / Self Care | Attending: Internal Medicine

## 2015-08-27 ENCOUNTER — Ambulatory Visit: Payer: Self-pay | Admitting: Urology

## 2015-08-27 DIAGNOSIS — G4733 Obstructive sleep apnea (adult) (pediatric): Secondary | ICD-10-CM | POA: Diagnosis present

## 2015-08-27 DIAGNOSIS — I4891 Unspecified atrial fibrillation: Secondary | ICD-10-CM

## 2015-08-27 DIAGNOSIS — A419 Sepsis, unspecified organism: Secondary | ICD-10-CM

## 2015-08-27 DIAGNOSIS — J9601 Acute respiratory failure with hypoxia: Secondary | ICD-10-CM | POA: Diagnosis not present

## 2015-08-27 DIAGNOSIS — N200 Calculus of kidney: Secondary | ICD-10-CM | POA: Diagnosis not present

## 2015-08-27 DIAGNOSIS — R6520 Severe sepsis without septic shock: Secondary | ICD-10-CM

## 2015-08-27 DIAGNOSIS — J9600 Acute respiratory failure, unspecified whether with hypoxia or hypercapnia: Secondary | ICD-10-CM | POA: Diagnosis not present

## 2015-08-27 DIAGNOSIS — I10 Essential (primary) hypertension: Secondary | ICD-10-CM | POA: Diagnosis present

## 2015-08-27 DIAGNOSIS — R778 Other specified abnormalities of plasma proteins: Secondary | ICD-10-CM | POA: Diagnosis present

## 2015-08-27 DIAGNOSIS — G8929 Other chronic pain: Secondary | ICD-10-CM | POA: Diagnosis present

## 2015-08-27 DIAGNOSIS — R7989 Other specified abnormal findings of blood chemistry: Secondary | ICD-10-CM

## 2015-08-27 DIAGNOSIS — J95821 Acute postprocedural respiratory failure: Secondary | ICD-10-CM | POA: Diagnosis not present

## 2015-08-27 DIAGNOSIS — W19XXXA Unspecified fall, initial encounter: Secondary | ICD-10-CM | POA: Diagnosis present

## 2015-08-27 DIAGNOSIS — E876 Hypokalemia: Secondary | ICD-10-CM | POA: Diagnosis present

## 2015-08-27 DIAGNOSIS — N202 Calculus of kidney with calculus of ureter: Secondary | ICD-10-CM | POA: Diagnosis not present

## 2015-08-27 DIAGNOSIS — R109 Unspecified abdominal pain: Secondary | ICD-10-CM | POA: Diagnosis present

## 2015-08-27 DIAGNOSIS — N132 Hydronephrosis with renal and ureteral calculous obstruction: Secondary | ICD-10-CM | POA: Diagnosis present

## 2015-08-27 DIAGNOSIS — A4901 Methicillin susceptible Staphylococcus aureus infection, unspecified site: Secondary | ICD-10-CM | POA: Diagnosis present

## 2015-08-27 DIAGNOSIS — F329 Major depressive disorder, single episode, unspecified: Secondary | ICD-10-CM | POA: Diagnosis present

## 2015-08-27 DIAGNOSIS — Z79899 Other long term (current) drug therapy: Secondary | ICD-10-CM | POA: Diagnosis not present

## 2015-08-27 DIAGNOSIS — J810 Acute pulmonary edema: Secondary | ICD-10-CM | POA: Diagnosis not present

## 2015-08-27 DIAGNOSIS — M549 Dorsalgia, unspecified: Secondary | ICD-10-CM

## 2015-08-27 DIAGNOSIS — N209 Urinary calculus, unspecified: Secondary | ICD-10-CM | POA: Diagnosis present

## 2015-08-27 DIAGNOSIS — M4326 Fusion of spine, lumbar region: Secondary | ICD-10-CM | POA: Diagnosis present

## 2015-08-27 DIAGNOSIS — F32A Depression, unspecified: Secondary | ICD-10-CM | POA: Diagnosis present

## 2015-08-27 DIAGNOSIS — I480 Paroxysmal atrial fibrillation: Secondary | ICD-10-CM | POA: Diagnosis not present

## 2015-08-27 HISTORY — PX: CYSTOSCOPY WITH RETROGRADE PYELOGRAM, URETEROSCOPY AND STENT PLACEMENT: SHX5789

## 2015-08-27 LAB — BLOOD GAS, ARTERIAL
ACID-BASE DEFICIT: 0.2 mmol/L (ref 0.0–2.0)
BICARBONATE: 24.5 meq/L — AB (ref 20.0–24.0)
Drawn by: 270211
FIO2: 0.4
LHR: 16 {breaths}/min
O2 SAT: 97.2 %
PATIENT TEMPERATURE: 101.8
PCO2 ART: 46.9 mmHg — AB (ref 35.0–45.0)
PEEP/CPAP: 5 cmH2O
PH ART: 7.349 — AB (ref 7.350–7.450)
TCO2: 22.4 mmol/L (ref 0–100)
VT: 620 mL
pO2, Arterial: 106 mmHg — ABNORMAL HIGH (ref 80.0–100.0)

## 2015-08-27 LAB — TROPONIN I
Troponin I: 0.04 ng/mL — ABNORMAL HIGH (ref <0.031)
Troponin I: 0.03 ng/mL (ref <0.031)

## 2015-08-27 LAB — COMPREHENSIVE METABOLIC PANEL
ALT: 15 U/L — AB (ref 17–63)
ALT: 17 U/L (ref 17–63)
AST: 18 U/L (ref 15–41)
AST: 27 U/L (ref 15–41)
Albumin: 3 g/dL — ABNORMAL LOW (ref 3.5–5.0)
Albumin: 3 g/dL — ABNORMAL LOW (ref 3.5–5.0)
Alkaline Phosphatase: 73 U/L (ref 38–126)
Alkaline Phosphatase: 68 U/L (ref 38–126)
Anion gap: 12 (ref 5–15)
Anion gap: 10 (ref 5–15)
BILIRUBIN TOTAL: 0.9 mg/dL (ref 0.3–1.2)
BILIRUBIN TOTAL: 1.3 mg/dL — AB (ref 0.3–1.2)
BUN: 22 mg/dL — AB (ref 6–20)
BUN: 28 mg/dL — ABNORMAL HIGH (ref 6–20)
CHLORIDE: 104 mmol/L (ref 101–111)
CO2: 24 mmol/L (ref 22–32)
CO2: 23 mmol/L (ref 22–32)
CREATININE: 0.73 mg/dL (ref 0.61–1.24)
CREATININE: 0.85 mg/dL (ref 0.61–1.24)
Calcium: 8.7 mg/dL — ABNORMAL LOW (ref 8.9–10.3)
Calcium: 8.2 mg/dL — ABNORMAL LOW (ref 8.9–10.3)
Chloride: 100 mmol/L — ABNORMAL LOW (ref 101–111)
GFR calc Af Amer: >60 mL/min (ref >60)
Glucose, Bld: 178 mg/dL — ABNORMAL HIGH (ref 65–99)
Glucose, Bld: 178 mg/dL — ABNORMAL HIGH (ref 65–99)
POTASSIUM: 3.8 mmol/L (ref 3.5–5.1)
Potassium: 3.8 mmol/L (ref 3.5–5.1)
Sodium: 136 mmol/L (ref 135–145)
Sodium: 137 mmol/L (ref 135–145)
TOTAL PROTEIN: 6.6 g/dL (ref 6.5–8.1)
TOTAL PROTEIN: 6.1 g/dL — AB (ref 6.5–8.1)

## 2015-08-27 LAB — CBC WITH DIFFERENTIAL/PLATELET
Basophils Absolute: 0 10*3/uL (ref 0.0–0.1)
Basophils Relative: 0 %
EOS PCT: 0 %
Eosinophils Absolute: 0 10*3/uL (ref 0.0–0.7)
HCT: 39.4 % (ref 39.0–52.0)
Hemoglobin: 12.1 g/dL — ABNORMAL LOW (ref 13.0–17.0)
LYMPHS ABS: 0.6 10*3/uL — AB (ref 0.7–4.0)
LYMPHS PCT: 3 %
MCH: 28.4 pg (ref 26.0–34.0)
MCHC: 30.7 g/dL (ref 30.0–36.0)
MCV: 92.5 fL (ref 78.0–100.0)
MONO ABS: 1.1 10*3/uL — AB (ref 0.1–1.0)
MONOS PCT: 6 %
NEUTROS ABS: 15 10*3/uL — AB (ref 1.7–7.7)
NEUTROS PCT: 91 %
PLATELETS: 132 10*3/uL — AB (ref 150–400)
RBC: 4.26 MIL/uL (ref 4.22–5.81)
RDW: 14.4 % (ref 11.5–15.5)
WBC: 16.6 10*3/uL — AB (ref 4.0–10.5)

## 2015-08-27 LAB — BASIC METABOLIC PANEL
Anion gap: 9 (ref 5–15)
BUN: 29 mg/dL — ABNORMAL HIGH (ref 6–20)
CHLORIDE: 101 mmol/L (ref 101–111)
CO2: 25 mmol/L (ref 22–32)
CREATININE: 0.75 mg/dL (ref 0.61–1.24)
Calcium: 8.2 mg/dL — ABNORMAL LOW (ref 8.9–10.3)
GFR calc Af Amer: >60 mL/min (ref >60)
GFR calc non Af Amer: >60 mL/min (ref >60)
Glucose, Bld: 179 mg/dL — ABNORMAL HIGH (ref 65–99)
Potassium: 3.9 mmol/L (ref 3.5–5.1)
Sodium: 135 mmol/L (ref 135–145)

## 2015-08-27 LAB — URINALYSIS, ROUTINE W REFLEX MICROSCOPIC
Glucose, UA: NEGATIVE mg/dL
Ketones, ur: 15 mg/dL — AB
NITRITE: POSITIVE — AB
PROTEIN: 100 mg/dL — AB
pH: 5.5 (ref 5.0–8.0)

## 2015-08-27 LAB — CBC
HCT: 39.9 % (ref 39.0–52.0)
Hemoglobin: 13.2 g/dL (ref 13.0–17.0)
MCH: 29.3 pg (ref 26.0–34.0)
MCHC: 33.1 g/dL (ref 30.0–36.0)
MCV: 88.7 fL (ref 78.0–100.0)
PLATELETS: 120 10*3/uL — AB (ref 150–400)
RBC: 4.5 MIL/uL (ref 4.22–5.81)
RDW: 14.2 % (ref 11.5–15.5)
WBC: 10.2 10*3/uL (ref 4.0–10.5)

## 2015-08-27 LAB — GLUCOSE, CAPILLARY
Glucose-Capillary: 179 mg/dL — ABNORMAL HIGH (ref 65–99)
Glucose-Capillary: 142 mg/dL — ABNORMAL HIGH (ref 65–99)
Glucose-Capillary: 133 mg/dL — ABNORMAL HIGH (ref 65–99)

## 2015-08-27 LAB — URINE MICROSCOPIC-ADD ON: Squamous Epithelial / LPF: NONE SEEN

## 2015-08-27 LAB — PROTIME-INR
INR: 1.17 (ref 0.00–1.49)
PROTHROMBIN TIME: 14.7 s (ref 11.6–15.2)

## 2015-08-27 LAB — ECHOCARDIOGRAM COMPLETE
HEIGHTINCHES: 72 in
WEIGHTICAEL: 4557.35 [oz_av]

## 2015-08-27 LAB — MRSA PCR SCREENING: MRSA by PCR: NEGATIVE

## 2015-08-27 LAB — LACTIC ACID, PLASMA
Lactic Acid, Venous: 2.4 mmol/L (ref 0.5–2.0)
Lactic Acid, Venous: 1.8 mmol/L (ref 0.5–2.0)

## 2015-08-27 LAB — APTT: APTT: 29 s (ref 24–37)

## 2015-08-27 LAB — MAGNESIUM: MAGNESIUM: 1.9 mg/dL (ref 1.7–2.4)

## 2015-08-27 LAB — HEPARIN LEVEL (UNFRACTIONATED)

## 2015-08-27 LAB — PHOSPHORUS: PHOSPHORUS: 3 mg/dL (ref 2.5–4.6)

## 2015-08-27 SURGERY — CYSTOURETEROSCOPY, WITH RETROGRADE PYELOGRAM AND STENT INSERTION
Anesthesia: General | Laterality: Left

## 2015-08-27 MED ORDER — SODIUM CHLORIDE 0.9 % IV SOLN
INTRAVENOUS | Status: DC | PRN
  Administered 2015-08-27: 08:00:00 via INTRAVENOUS

## 2015-08-27 MED ORDER — FENTANYL CITRATE (PF) 100 MCG/2ML IJ SOLN
INTRAMUSCULAR | Status: DC | PRN
  Administered 2015-08-27: 100 ug via INTRAVENOUS

## 2015-08-27 MED ORDER — MIDAZOLAM HCL 2 MG/2ML IJ SOLN
INTRAMUSCULAR | Status: AC
  Filled 2015-08-27: qty 2

## 2015-08-27 MED ORDER — MAGNESIUM SULFATE 4 GM/100ML IV SOLN
4.0000 g | Freq: Once | INTRAVENOUS | Status: AC
  Administered 2015-08-27: 4 g via INTRAVENOUS
  Filled 2015-08-27: qty 100

## 2015-08-27 MED ORDER — FENTANYL CITRATE (PF) 100 MCG/2ML IJ SOLN
INTRAMUSCULAR | Status: AC
  Filled 2015-08-27: qty 2

## 2015-08-27 MED ORDER — SODIUM CHLORIDE 0.9% FLUSH
3.0000 mL | Freq: Two times a day (BID) | INTRAVENOUS | Status: DC
  Administered 2015-08-27 – 2015-08-31 (×8): 3 mL via INTRAVENOUS

## 2015-08-27 MED ORDER — VANCOMYCIN HCL IN DEXTROSE 1-5 GM/200ML-% IV SOLN
1000.0000 mg | Freq: Once | INTRAVENOUS | Status: AC
  Administered 2015-08-27: 1000 mg via INTRAVENOUS
  Filled 2015-08-27: qty 200

## 2015-08-27 MED ORDER — IOHEXOL 300 MG/ML  SOLN
INTRAMUSCULAR | Status: DC | PRN
  Administered 2015-08-27: 14 mL via URETHRAL

## 2015-08-27 MED ORDER — PIPERACILLIN-TAZOBACTAM 3.375 G IVPB 30 MIN
3.3750 g | Freq: Once | INTRAVENOUS | Status: AC
  Administered 2015-08-27: 3.375 g via INTRAVENOUS
  Filled 2015-08-27: qty 50

## 2015-08-27 MED ORDER — VANCOMYCIN HCL IN DEXTROSE 1-5 GM/200ML-% IV SOLN: 1000.0000 mg | Freq: Once | INTRAVENOUS | Status: DC

## 2015-08-27 MED ORDER — SUCCINYLCHOLINE CHLORIDE 20 MG/ML IJ SOLN
INTRAMUSCULAR | Status: DC | PRN
Start: 2015-08-27 — End: 2015-08-27
  Administered 2015-08-27: 100 mg via INTRAVENOUS

## 2015-08-27 MED ORDER — ANTISEPTIC ORAL RINSE SOLUTION (CORINZ)
7.0000 mL | Freq: Four times a day (QID) | OROMUCOSAL | Status: DC
  Administered 2015-08-27 – 2015-08-31 (×15): 7 mL via OROMUCOSAL

## 2015-08-27 MED ORDER — PHENYLEPHRINE HCL 10 MG/ML IJ SOLN
INTRAMUSCULAR | Status: DC | PRN
  Administered 2015-08-27 (×3): 80 ug via INTRAVENOUS
  Administered 2015-08-27: 40 ug via INTRAVENOUS
  Administered 2015-08-27: 80 ug via INTRAVENOUS

## 2015-08-27 MED ORDER — METOPROLOL TARTRATE 1 MG/ML IV SOLN
5.0000 mg | Freq: Once | INTRAVENOUS | Status: AC
  Administered 2015-08-27: 5 mg via INTRAVENOUS
  Filled 2015-08-27: qty 5

## 2015-08-27 MED ORDER — POTASSIUM CHLORIDE CRYS ER 20 MEQ PO TBCR
20.0000 meq | EXTENDED_RELEASE_TABLET | Freq: Two times a day (BID) | ORAL | Status: DC
  Administered 2015-08-27 – 2015-08-30 (×8): 20 meq via ORAL
  Filled 2015-08-27 (×8): qty 1

## 2015-08-27 MED ORDER — HEPARIN (PORCINE) IN NACL 100-0.45 UNIT/ML-% IJ SOLN
1500.0000 [IU]/h | INTRAMUSCULAR | Status: DC
Start: 2015-08-27 — End: 2015-08-27
  Administered 2015-08-27: 1500 [IU]/h via INTRAVENOUS
  Filled 2015-08-27 (×2): qty 250

## 2015-08-27 MED ORDER — ETOMIDATE 2 MG/ML IV SOLN
INTRAVENOUS | Status: DC | PRN
  Administered 2015-08-27: 20 mg via INTRAVENOUS

## 2015-08-27 MED ORDER — ONDANSETRON HCL 4 MG/2ML IJ SOLN: 4.0000 mg | Freq: Four times a day (QID) | INTRAMUSCULAR | Status: DC | PRN

## 2015-08-27 MED ORDER — PIPERACILLIN-TAZOBACTAM 3.375 G IVPB
3.3750 g | Freq: Three times a day (TID) | INTRAVENOUS | Status: DC
  Administered 2015-08-27 – 2015-08-30 (×10): 3.375 g via INTRAVENOUS
  Filled 2015-08-27 (×10): qty 50

## 2015-08-27 MED ORDER — ENOXAPARIN SODIUM 40 MG/0.4ML ~~LOC~~ SOLN
40.0000 mg | SUBCUTANEOUS | Status: DC
  Filled 2015-08-27: qty 0.4

## 2015-08-27 MED ORDER — LACTATED RINGERS IV SOLN
INTRAVENOUS | Status: DC | PRN
  Administered 2015-08-27 (×3): via INTRAVENOUS

## 2015-08-27 MED ORDER — METOCLOPRAMIDE HCL 5 MG/ML IJ SOLN
INTRAMUSCULAR | Status: DC | PRN
  Administered 2015-08-27: 10 mg via INTRAVENOUS

## 2015-08-27 MED ORDER — VANCOMYCIN HCL IN DEXTROSE 1-5 GM/200ML-% IV SOLN: 1000.0000 mg | Freq: Two times a day (BID) | INTRAVENOUS | Status: DC

## 2015-08-27 MED ORDER — VITAMIN D 1000 UNITS PO TABS
2000.0000 [IU] | ORAL_TABLET | Freq: Every day | ORAL | Status: DC
  Administered 2015-08-27 – 2015-08-31 (×5): 2000 [IU] via ORAL
  Filled 2015-08-27 (×5): qty 2

## 2015-08-27 MED ORDER — HYDROCODONE-ACETAMINOPHEN 10-325 MG PO TABS: 1.0000 | ORAL_TABLET | Freq: Four times a day (QID) | ORAL | Status: DC | PRN

## 2015-08-27 MED ORDER — LEVOFLOXACIN IN D5W 500 MG/100ML IV SOLN
500.0000 mg | INTRAVENOUS | Status: DC
  Filled 2015-08-27 (×2): qty 100

## 2015-08-27 MED ORDER — MIDAZOLAM HCL 5 MG/5ML IJ SOLN
INTRAMUSCULAR | Status: DC | PRN
  Administered 2015-08-27: 2 mg via INTRAVENOUS

## 2015-08-27 MED ORDER — DILTIAZEM HCL 100 MG IV SOLR: 5.0000 mg/h | INTRAVENOUS | Status: DC

## 2015-08-27 MED ORDER — ETOMIDATE 2 MG/ML IV SOLN
INTRAVENOUS | Status: AC
  Filled 2015-08-27: qty 10

## 2015-08-27 MED ORDER — DEXTROSE 5 % IV SOLN
2.0000 g | INTRAVENOUS | Status: DC
  Filled 2015-08-27: qty 2

## 2015-08-27 MED ORDER — FENTANYL CITRATE (PF) 100 MCG/2ML IJ SOLN: 50.0000 ug | Freq: Once | INTRAMUSCULAR | Status: DC

## 2015-08-27 MED ORDER — PIPERACILLIN-TAZOBACTAM 3.375 G IVPB 30 MIN: 3.3750 g | Freq: Once | INTRAVENOUS | Status: DC

## 2015-08-27 MED ORDER — HEPARIN (PORCINE) IN NACL 100-0.45 UNIT/ML-% IJ SOLN
1800.0000 [IU]/h | INTRAMUSCULAR | Status: DC
  Administered 2015-08-27 – 2015-08-28 (×2): 1800 [IU]/h via INTRAVENOUS
  Filled 2015-08-27 (×2): qty 250

## 2015-08-27 MED ORDER — POTASSIUM CHLORIDE IN NACL 40-0.9 MEQ/L-% IV SOLN
INTRAVENOUS | Status: DC
  Administered 2015-08-27: 75 mL/h via INTRAVENOUS
  Filled 2015-08-27: qty 1000

## 2015-08-27 MED ORDER — PHENYLEPHRINE HCL 10 MG/ML IJ SOLN
10.0000 mg | INTRAMUSCULAR | Status: DC | PRN
  Administered 2015-08-27: 50 ug/min via INTRAVENOUS

## 2015-08-27 MED ORDER — PERFLUTREN LIPID MICROSPHERE
INTRAVENOUS | Status: AC
  Filled 2015-08-27: qty 10

## 2015-08-27 MED ORDER — SODIUM CHLORIDE 0.9 % IV BOLUS (SEPSIS)
1000.0000 mL | Freq: Once | INTRAVENOUS | Status: AC
  Administered 2015-08-27: 1000 mL via INTRAVENOUS

## 2015-08-27 MED ORDER — FENTANYL BOLUS VIA INFUSION
25.0000 ug | INTRAVENOUS | Status: DC | PRN
  Administered 2015-08-27 – 2015-08-28 (×6): 25 ug via INTRAVENOUS
  Filled 2015-08-27: qty 25

## 2015-08-27 MED ORDER — ROCURONIUM BROMIDE 100 MG/10ML IV SOLN
INTRAVENOUS | Status: AC
  Filled 2015-08-27: qty 1

## 2015-08-27 MED ORDER — INSULIN ASPART 100 UNIT/ML ~~LOC~~ SOLN
2.0000 [IU] | SUBCUTANEOUS | Status: DC
  Administered 2015-08-27: 4 [IU] via SUBCUTANEOUS
  Administered 2015-08-27 – 2015-08-31 (×14): 2 [IU] via SUBCUTANEOUS

## 2015-08-27 MED ORDER — TAMSULOSIN HCL 0.4 MG PO CAPS
0.4000 mg | ORAL_CAPSULE | Freq: Every day | ORAL | Status: DC
  Administered 2015-08-27 – 2015-08-30 (×3): 0.4 mg via ORAL
  Filled 2015-08-27 (×3): qty 1

## 2015-08-27 MED ORDER — ACETAMINOPHEN 10 MG/ML IV SOLN
1000.0000 mg | Freq: Once | INTRAVENOUS | Status: AC
  Administered 2015-08-27: 1000 mg via INTRAVENOUS
  Filled 2015-08-27: qty 100

## 2015-08-27 MED ORDER — SODIUM CHLORIDE 0.9 % IR SOLN
Status: DC | PRN
  Administered 2015-08-27: 3000 mL via INTRAVESICAL

## 2015-08-27 MED ORDER — LIDOCAINE HCL (CARDIAC) 20 MG/ML IV SOLN
INTRAVENOUS | Status: AC
  Filled 2015-08-27: qty 5

## 2015-08-27 MED ORDER — FENTANYL BOLUS VIA INFUSION
50.0000 ug | INTRAVENOUS | Status: DC | PRN
  Filled 2015-08-27: qty 200

## 2015-08-27 MED ORDER — VASOPRESSIN 20 UNIT/ML IV SOLN
INTRAVENOUS | Status: AC
  Filled 2015-08-27: qty 1

## 2015-08-27 MED ORDER — FENTANYL CITRATE (PF) 2500 MCG/50ML IJ SOLN
25.0000 ug/h | INTRAMUSCULAR | Status: DC
  Administered 2015-08-27: 50 ug/h via INTRAVENOUS
  Filled 2015-08-27 (×2): qty 50

## 2015-08-27 MED ORDER — FENTANYL CITRATE (PF) 100 MCG/2ML IJ SOLN
50.0000 ug | INTRAMUSCULAR | Status: DC | PRN
  Administered 2015-08-27 (×2): 100 ug via INTRAVENOUS
  Filled 2015-08-27 (×2): qty 2

## 2015-08-27 MED ORDER — FENTANYL BOLUS VIA INFUSION
25.0000 ug | INTRAVENOUS | Status: DC | PRN
  Filled 2015-08-27: qty 25

## 2015-08-27 MED ORDER — CELECOXIB 100 MG PO CAPS
100.0000 mg | ORAL_CAPSULE | Freq: Two times a day (BID) | ORAL | Status: DC
  Filled 2015-08-27 (×2): qty 1

## 2015-08-27 MED ORDER — ROCURONIUM BROMIDE 100 MG/10ML IV SOLN
INTRAVENOUS | Status: DC | PRN
  Administered 2015-08-27: 40 mg via INTRAVENOUS

## 2015-08-27 MED ORDER — METOCLOPRAMIDE HCL 5 MG/ML IJ SOLN
INTRAMUSCULAR | Status: AC
  Filled 2015-08-27: qty 2

## 2015-08-27 MED ORDER — MIDAZOLAM HCL 2 MG/2ML IJ SOLN: 1.0000 mg | INTRAMUSCULAR | Status: DC | PRN

## 2015-08-27 MED ORDER — PROPOFOL 10 MG/ML IV BOLUS
INTRAVENOUS | Status: DC | PRN
  Administered 2015-08-27 (×2): 50 mg via INTRAVENOUS

## 2015-08-27 MED ORDER — METOPROLOL TARTRATE 25 MG PO TABS
25.0000 mg | ORAL_TABLET | Freq: Two times a day (BID) | ORAL | Status: DC
  Administered 2015-08-27: 25 mg via ORAL
  Filled 2015-08-27: qty 1

## 2015-08-27 MED ORDER — PHENYLEPHRINE HCL 10 MG/ML IJ SOLN
INTRAMUSCULAR | Status: AC
  Filled 2015-08-27: qty 1

## 2015-08-27 MED ORDER — PERFLUTREN LIPID MICROSPHERE
1.0000 mL | INTRAVENOUS | Status: AC | PRN
  Administered 2015-08-27: 3 mL via INTRAVENOUS
  Filled 2015-08-27: qty 10

## 2015-08-27 MED ORDER — BISACODYL 10 MG RE SUPP: 10.0000 mg | Freq: Every day | RECTAL | Status: DC | PRN

## 2015-08-27 MED ORDER — PHENYLEPHRINE 40 MCG/ML (10ML) SYRINGE FOR IV PUSH (FOR BLOOD PRESSURE SUPPORT)
PREFILLED_SYRINGE | INTRAVENOUS | Status: AC
  Filled 2015-08-27: qty 10

## 2015-08-27 MED ORDER — CHLORHEXIDINE GLUCONATE 0.12% ORAL RINSE (MEDLINE KIT)
15.0000 mL | Freq: Two times a day (BID) | OROMUCOSAL | Status: DC
  Administered 2015-08-27 – 2015-08-31 (×8): 15 mL via OROMUCOSAL

## 2015-08-27 MED ORDER — LEVOFLOXACIN IN D5W 750 MG/150ML IV SOLN: 750.0000 mg | INTRAVENOUS | Status: DC

## 2015-08-27 MED ORDER — SODIUM CHLORIDE 0.9 % IV SOLN
INTRAVENOUS | Status: DC
Start: 2015-08-27 — End: 2015-08-28
  Administered 2015-08-27: 18:00:00 via INTRAVENOUS

## 2015-08-27 MED ORDER — PROPOFOL 10 MG/ML IV BOLUS
INTRAVENOUS | Status: AC
  Filled 2015-08-27: qty 20

## 2015-08-27 MED ORDER — HYDROMORPHONE HCL 1 MG/ML IJ SOLN
1.0000 mg | INTRAMUSCULAR | Status: DC | PRN
  Administered 2015-08-27: 1 mg via INTRAVENOUS
  Filled 2015-08-27: qty 1

## 2015-08-27 MED ORDER — POTASSIUM CHLORIDE 20 MEQ PO PACK: 20.0000 meq | PACK | Freq: Once | ORAL | Status: DC

## 2015-08-27 MED ORDER — VANCOMYCIN HCL 10 G IV SOLR
1250.0000 mg | Freq: Two times a day (BID) | INTRAVENOUS | Status: DC
  Administered 2015-08-27 – 2015-08-29 (×4): 1250 mg via INTRAVENOUS
  Filled 2015-08-27 (×5): qty 1250

## 2015-08-27 MED ORDER — DULOXETINE HCL 30 MG PO CPEP: 30.0000 mg | ORAL_CAPSULE | Freq: Every day | ORAL | Status: DC

## 2015-08-27 MED ORDER — LIDOCAINE HCL (CARDIAC) 20 MG/ML IV SOLN
INTRAVENOUS | Status: DC | PRN
  Administered 2015-08-27: 100 mg via INTRAVENOUS

## 2015-08-27 MED ORDER — DULOXETINE HCL 60 MG PO CPEP
90.0000 mg | ORAL_CAPSULE | Freq: Every day | ORAL | Status: DC
  Administered 2015-08-29 – 2015-08-31 (×3): 90 mg via ORAL
  Filled 2015-08-27: qty 1
  Filled 2015-08-27 (×3): qty 3
  Filled 2015-08-27 (×2): qty 1

## 2015-08-27 MED ORDER — PHENYLEPHRINE HCL 10 MG/ML IJ SOLN
30.0000 ug/min | INTRAVENOUS | Status: DC
  Filled 2015-08-27: qty 1

## 2015-08-27 MED ORDER — DULOXETINE HCL 60 MG PO CPEP: 60.0000 mg | ORAL_CAPSULE | Freq: Every day | ORAL | Status: DC

## 2015-08-27 MED ORDER — MIDAZOLAM HCL 2 MG/2ML IJ SOLN
1.0000 mg | INTRAMUSCULAR | Status: DC | PRN
  Administered 2015-08-27 (×3): 2 mg via INTRAVENOUS
  Administered 2015-08-28: 1 mg via INTRAVENOUS
  Administered 2015-08-28: 2 mg via INTRAVENOUS
  Administered 2015-08-28: 1 mg via INTRAVENOUS
  Filled 2015-08-27 (×6): qty 2

## 2015-08-27 MED ORDER — FENTANYL CITRATE (PF) 2500 MCG/50ML IJ SOLN
25.0000 ug/h | INTRAMUSCULAR | Status: DC
  Administered 2015-08-27: 50 ug/h via INTRAVENOUS
  Filled 2015-08-27: qty 50

## 2015-08-27 MED ORDER — FENTANYL CITRATE (PF) 100 MCG/2ML IJ SOLN
100.0000 ug | Freq: Once | INTRAMUSCULAR | Status: AC
  Administered 2015-08-27: 100 ug via INTRAVENOUS

## 2015-08-27 SURGICAL SUPPLY — 22 items
BAG URO CATCHER STRL LF (MISCELLANEOUS) ×3 IMPLANT
BASKET ZERO TIP NITINOL 2.4FR (BASKET) IMPLANT
CATH FOLEY 2WAY SLVR  5CC 18FR (CATHETERS) ×2
CATH FOLEY 2WAY SLVR 5CC 18FR (CATHETERS) ×1 IMPLANT
CATH INTERMIT  6FR 70CM (CATHETERS) ×3 IMPLANT
CLOTH BEACON ORANGE TIMEOUT ST (SAFETY) ×3 IMPLANT
FIBER LASER FLEXIVA 1000 (UROLOGICAL SUPPLIES) IMPLANT
FIBER LASER FLEXIVA 200 (UROLOGICAL SUPPLIES) IMPLANT
FIBER LASER FLEXIVA 365 (UROLOGICAL SUPPLIES) IMPLANT
FIBER LASER FLEXIVA 550 (UROLOGICAL SUPPLIES) IMPLANT
FIBER LASER TRAC TIP (UROLOGICAL SUPPLIES) IMPLANT
GLOVE BIOGEL M 8.0 STRL (GLOVE) ×3 IMPLANT
GOWN STRL REUS W/ TWL XL LVL3 (GOWN DISPOSABLE) ×1 IMPLANT
GOWN STRL REUS W/TWL XL LVL3 (GOWN DISPOSABLE) ×5 IMPLANT
GUIDEWIRE ANG ZIPWIRE 038X150 (WIRE) ×3 IMPLANT
GUIDEWIRE STR DUAL SENSOR (WIRE) ×3 IMPLANT
MANIFOLD NEPTUNE II (INSTRUMENTS) ×3 IMPLANT
PACK CYSTO (CUSTOM PROCEDURE TRAY) ×3 IMPLANT
STENT URET 6FRX26 CONTOUR (STENTS) ×6 IMPLANT
TUBING CONNECTING 10 (TUBING) ×2 IMPLANT
TUBING CONNECTING 10' (TUBING) ×1
URINEMETER 200ML W/220 (MISCELLANEOUS) ×3 IMPLANT

## 2015-08-27 NOTE — Consult Note (Signed)
CARDIOLOGY CONSULT NOTE   Patient ID: [name] MRN: [id number] DOB/AGE: 01-11-1941 75 y.o.  Admit date: 08/26/2015  Consulting Physician: Dr. Robb Matar Primary Physician   PROVIDER NOT IN SYSTEM Primary Cardiologist   None- Dr. Eden Emms Reason for Consultation   New onset atrial fib/flutter   HPI: [name] is a 75 y.o. male with a history of HTN, morbid obesity, and OSA not on CPAP who presented to [facility] on 08/26/15 with left hip and flank pain. He was found to have a left ureteral stone. He was also noted to be in new onset atrial fib/flutter and cardiology consulted.   He presented to the emergency room reporting having fallen on Tuesday and injuring his left hip. He then developed pain in the flank region. The pain became increasingly severe and he presented to the emergency room. A CT scan was obtained which revealed stones in his distal ureter just above the ureterovesical junction and he was found to have no elevation of his white blood cell count but no fever. It was initially felt that pain control was indicated and possible outpatient management of his stones could possibly be undertaken if his pain was controlled. When the urologist saw the patient he had AMS and subsequently developed a fever up to 104 and tachycardia. It was ultimately decided to proceed with cystoscopy and insertion of bilateral ureteral stents by Dr. Berneice Heinrich today.  He just returned from urologic procedure and is intubated and sedated. Therefore, no history could be obtained. He appears comfortable. I tried calling the wife for more information but there was no answer.   Past Medical History  Diagnosis Date  . Sleep apnea   . Hypertension   . Depression   . Arthritis   . Urinary frequency   . Lumbar neuralgia   . Headache(784.0)   . Constipation due to pain medication   . Hepatitis   . Hepatitis B     "years ago"     Past Surgical History  Procedure Laterality Date  . Back surgery      7  lumbar surgeries  . Appendectomy    . Joint replacement      Rt shoulder    Allergies  Allergen Reactions  . Gabapentin Other (See Comments)    "makes me loopy"  . Ibuprofen Rash    Hives     I have reviewed the patient's current medications . cefTRIAXone (ROCEPHIN)  IV  2 g Intravenous Q24H  . celecoxib  100 mg Oral BID  . cholecalciferol  2,000 Units Oral Daily  . DULoxetine  90 mg Oral Daily  . enoxaparin (LOVENOX) injection  40 mg Subcutaneous Q24H  . fentaNYL (SUBLIMAZE) injection  100 mcg Intravenous Once  . fentaNYL (SUBLIMAZE) injection  50 mcg Intravenous Once  . metoprolol tartrate  25 mg Oral BID  . potassium chloride  20 mEq Oral BID  . sodium chloride  1,000 mL Intravenous Once  . sodium chloride flush  3 mL Intravenous Q12H  . tamsulosin  0.4 mg Oral QHS   . 0.9 % NaCl with KCl 40 mEq / L 75 mL/hr (08/27/15 0307)  . diltiazem (CARDIZEM) infusion    . fentaNYL infusion INTRAVENOUS    . phenylephrine (NEO-SYNEPHRINE) Adult infusion Stopped (08/27/15 0928)   bisacodyl, fentaNYL, HYDROcodone-acetaminophen, HYDROmorphone (DILAUDID) injection, iohexol, midazolam, midazolam, ondansetron (ZOFRAN) IV  Prior to Admission medications   Medication Sig Start Date End Date Taking? Authorizing Provider  celecoxib (CELEBREX) 100  MG capsule Take 100 mg by mouth 2 (two) times daily.   Yes Historical Provider, MD  Cholecalciferol (VITAMIN D) 2000 units tablet Take 2,000 Units by mouth daily.   Yes Historical Provider, MD  ciprofloxacin (CIPRO) 500 MG tablet Take 500 mg by mouth 2 (two) times daily.   Yes Historical Provider, MD  DULoxetine (CYMBALTA) 30 MG capsule Take 30 mg by mouth daily. Take with 60 mg = 90 mg total   Yes Historical Provider, MD  DULoxetine (CYMBALTA) 60 MG capsule Take 60 mg by mouth daily. Take with 30 mg = 90 mg total   Yes Historical Provider, MD  HYDROcodone-acetaminophen (NORCO) 10-325 MG tablet Take 1 tablet by mouth every 6 (six) hours as needed for  moderate pain.   Yes Historical Provider, MD  tamsulosin (FLOMAX) 0.4 MG CAPS capsule Take 0.4 mg by mouth at bedtime.   Yes Historical Provider, MD     Social History   Social History  . Marital Status: Married    Spouse Name: N/A  . Number of Children: N/A  . Years of Education: N/A   Occupational History  . Not on file.   Social History Main Topics  . Smoking status: Never Smoker   . Smokeless tobacco: Current User    Types: Snuff  . Alcohol Use: No  . Drug Use: No  . Sexual Activity: Not on file   Other Topics Concern  . Not on file   Social History Narrative    No family status information on file.   History reviewed. No pertinent family history.   ROS:  Full 14 point review of systems complete and found to be negative unless listed above.  Physical Exam: Blood pressure 120/71, pulse 116, temperature 104.8 F (40.4 C), temperature source Rectal, resp. rate 23, height 6' (1.829 m), SpO2 100 %.  General: Well developed, well nourished, male in no acute distress. Intubated and sedated Head: Eyes PERRLA, No xanthomas.   Normocephalic and atraumatic, oropharynx without edema or exudate.  Lungs: intubtaed Heart: HRRR S1 S2, no rub/gallop, Heart irregular rate and rhythm with S1, S2  murmur. pulses are 2+ extrem.   Neck: No carotid bruits. No lymphadenopathy. no JVD. Abdomen: Bowel sounds present, abdomen soft and non-tender without masses or hernias noted. Msk:  No spine or cva tenderness. No weakness, no joint deformities or effusions. Extremities: No clubbing or cyanosis. No LE edema.  Neuro: Alert and oriented X 3. No focal deficits noted. Psych:  Good affect, responds appropriately Skin: No rashes or lesions noted.  Labs:   Lab Results  Component Value Date   WBC 10.2 08/27/2015   HGB 13.2 08/27/2015   HCT 39.9 08/27/2015   MCV 88.7 08/27/2015   PLT 120* 08/27/2015    Recent Labs  08/26/15 1800  INR 1.09    Recent Labs Lab 08/27/15 0537  NA 136    K 3.8  CL 100*  CO2 24  BUN 22*  CREATININE 0.73  CALCIUM 8.7*  PROT 6.6  BILITOT 0.9  ALKPHOS 73  ALT 15*  AST 18  GLUCOSE 178*  ALBUMIN 3.0*   MAGNESIUM  Date Value Ref Range Status  08/27/2015 1.9 1.7 - 2.4 mg/dL Final    Recent Labs  16/10/96 1800 08/26/15 1900 08/27/15 0051 08/27/15 0537  CKTOTAL  --  64  --   --   TROPONINI 0.04*  --  0.04* 0.03    Recent Labs  08/26/15 1842  TROPIPOC 0.02    Echo:  pending  ECG:  A flutter with var AV block and PVCs HR 114  (08/26/15)  Radiology:  Dg Chest 1 View  08/26/2015  CLINICAL DATA:  Fall 2 days prior, with back and left hip pain. EXAM: CHEST 1 VIEW COMPARISON:  Radiograph 01/03/2012 FINDINGS: Lung volumes are low leading to accentuation of the cardiac size and crowding of bronchovascular structures. No confluent airspace disease, large pleural effusion or pneumothorax. No acute osseous abnormalities are seen. IMPRESSION: Hypoventilatory chest accentuating cardiac size and crowding of bronchovascular structures. Recommend PA and lateral views when patient is able. Electronically Signed   By: Rubye Oaks M.D.   On: 08/26/2015 19:27   Dg Lumbar Spine Complete  08/26/2015  CLINICAL DATA:  Fall tuesday evening EXAM: LUMBAR SPINE - COMPLETE 4+ VIEW COMPARISON:  08/10/05 FINDINGS: Prior L1/2 fusion with hardware. Normal alignment. No acute fracture. Diffuse osseous fusion lumbar spine IMPRESSION: No acute findings. Electronically Signed   By: Esperanza Heir M.D.   On: 08/26/2015 19:27   Dg Chest Portable 1 View  08/27/2015  CLINICAL DATA:  Preop for cystoscopy. Left ureteral obstruction. Fever. EXAM: PORTABLE CHEST - 1 VIEW COMPARISON:  One-view chest x-ray 08/26/2015. FINDINGS: The heart size is exaggerated by low lung volumes. Mild interstitial coarsening is stable. Asymmetric opacification is again noted at the right lung apex. Recommend continued follow-up with PA and lateral chest radiographs prior to discharge.  IMPRESSION: 1. Persistent low lung volumes. 2. Asymmetric right apical airspace opacification. Recommend follow-up PA and lateral chest radiographs as the patient's condition allows. Electronically Signed   By: Marin Roberts M.D.   On: 08/27/2015 07:19   Dg Hip Unilat With Pelvis 2-3 Views Left  08/26/2015  CLINICAL DATA:  Fall 2 days prior, persistent left hip pain. EXAM: DG HIP (WITH OR WITHOUT PELVIS) 2-3V LEFT COMPARISON:  None. FINDINGS: The cortical margins of the bony pelvis are intact. No fracture. Pubic symphysis and sacroiliac joints are congruent. Mild degenerative change of both hips. Both femoral heads are well-seated in the respective acetabula. IMPRESSION: No pelvic or left hip fracture. Electronically Signed   By: Rubye Oaks M.D.   On: 08/26/2015 19:25   Ct Cta Abd/pel W/cm &/or W/o Cm  08/26/2015  CLINICAL DATA:  Recent fall with extreme left-sided pain radiating to the back. Clinical concern for abdominal aortic aneurysm or dissection. EXAM: CTA ABDOMEN AND PELVIS wITHOUT AND WITH CONTRAST TECHNIQUE: Multidetector CT imaging of the abdomen and pelvis was performed using the standard protocol during bolus administration of intravenous contrast. Multiplanar reconstructed images and MIPs were obtained and reviewed to evaluate the vascular anatomy. CONTRAST:  100 cc Omnipaque 350 IV COMPARISON:  Lumbar spine and hip radiographs earlier this day. FINDINGS: Lower chest: Atelectasis in the dependent right greater than left lower lobe. Heart upper limits of normal in size. Coronary artery calcifications. No pleural effusion. Liver: No acute traumatic injury. Prominent size. No evidence of focal lesion allowing for noncontrast and arterial phase imaging. Hepatobiliary: Distended gallbladder containing intraluminal gallstone. No pericholecystic inflammation. Prominence of the common bile duct measures 10 mm at the porta hepatis. Pancreas: Mild fatty atrophy. No ductal dilatation or  inflammation. No evidence of traumatic injury. Spleen: Normal for arterial phase imaging. No perisplenic fluid. Spleen is enlarged measuring 15.7 cm cranial caudal. Adrenal glands: No nodule or hemorrhage. Kidneys: Symmetric renal enhancement. No hydronephrosis. No significant perinephric stranding. Nonobstructing bilateral renal stones. There are 2 adjacent 8 mm stones in the distal left ureter at the ureterovesicular junction,  no ureteral dilatation. Bilateral renal cysts. There is cortical scarring in the left kidney. Stomach/Bowel: Stomach is decompressed. There are no dilated or thickened small bowel loops. Cecum is medially displaced in the mid abdomen with prominent stool. Moderate stool throughout the remainder the colon. Distal colonic diverticulosis without diverticulitis. The appendix is not visualized. Vascular/Lymphatic: Normal caliber abdominal aorta without aneurysm or dissection. Mild tortuosity and atherosclerosis. No aortic hematoma, soft tissue stranding, or acute aortic abnormality. Celiac, superior and inferior mesenteric arteries are patent. Single bilateral renal arteries are patent. Tortuosity of both common and external iliac arteries without aneurysmal dilatation. No retroperitoneal fluid. No retroperitoneal adenopathy. Reproductive: Normal sized prostate gland. Bladder: Minimally distended, no wall thickening. Other: No free air, free fluid, or intra-abdominal fluid collection. Tiny fat containing umbilical hernia. Musculoskeletal: There are no acute or suspicious osseous abnormalities. Extensive multilevel postsurgical change in the spine. Posterior hardware at L1-2 with rod and intrapedicular screws, the pedicular screws abut the T12-L1 disc space. No evidence of acute fracture allowing for degree of background chronic change. No fracture of the bony pelvis or hips. Probable bone harvest in the right iliac bone. Review of the MIP images confirms the above findings. IMPRESSION: 1. Normal  caliber abdominal aorta without acute aortic abnormality or aortic aneurysm. 2. No evidence of acute traumatic abnormality in the abdomen/pelvis. 3. There are 2 adjacent distal left ureteric stones both measuring 8 mm. No proximal ureteral dilatation or frank hydronephrosis, however these may be intermittently obstructing and cause of patient's left-sided pain. There are nonobstructing stones in both kidneys. Electronically Signed   By: Rubye OaksMelanie  Ehinger M.D.   On: 08/26/2015 20:39    ASSESSMENT AND PLAN:    Principal Problem:   Urolithiasis Active Problems:   Lumbar pseudoarthrosis   Left nephrolithiasis   Chronic back pain   Hypertension   OSA (obstructive sleep apnea)   Depression   Elevated troponin level   New onset atrial fibrillation (HCC)   Sepsis (HCC)  Bilal L [name] is a 75 y.o. male with a history of HTN, morbid obesity, OSA not on CPAP who [facility] with left hip and flank pain. He was found to have a left ureteral stone. He was also noted to be in new onset atrial fib/flutter and cardiology consulted.   New onset atrial fib/flutter: this is a new diagnosis  -- CHADSVASC score of at least 2 (HTN 1 , Age 64). Currently on Lovenox for DVT prophylaxis. I contacted Dr. Berneice HeinrichManny and he said we are okay to start IV heparin but we should expect some hematuria  -- He was on a cardizem gtt but this has been discontinued. HR in 90s currently  -- 2D ECHO pending.   Mildly elevated troponin: low and flat 0.04--> 0.04--> 0.03. Likely demand ischemia in the setting of acute illness  HTN: BP well controlled currently. It doesn't appear that he was on any anti hypertensive at home. He was hypotensive requiring pressors but now off pressors and BP stable.    Ureteral stone: s/p cystoscopy and stenting today. Intubated currently. Temp 104. Hypotension resolved and off pressors.   OSA: not on CPAP per hx   Signed: Janetta HoraHOMPSON, KATHRYN R, PA-C 08/27/2015 9:40 AM  Pager  [phone]  Co-Sign MD  See separate note  Will start heparin and order echo . No need for rate control currently and was Hypotensive with cardizem.    Charlton HawsPeter Ameliarose Shark

## 2015-08-27 NOTE — ED Notes (Addendum)
Dr Vernie Ammonsttelin in room and informed of fever. Gave verbal order for 15mg  of toradol. Pt has allergy to ibuprofen so Dr Vernie Ammonsttelin stated to prepare [facility] to [facility].

## 2015-08-27 NOTE — Progress Notes (Signed)
ANTICOAGULATION CONSULT NOTE - Initial Consult  Pharmacy Consult for heparin Indication: atrial fibrillation  Allergies  Allergen Reactions  . Gabapentin Other (See Comments)    "makes me loopy"  . Ibuprofen Rash    Hives     Patient Measurements: Height: 6' (182.9 cm) IBW/kg (Calculated) : 77.6  TBW: 129.2 kg Heparin Dosing Weight: 107 kg  Vital Signs: Temp: 101.8 F (38.8 C) (03/10 0921) Temp Source: Rectal (03/10 0921) BP: 120/71 mmHg (03/10 0703) Pulse Rate: 116 (03/10 0703)  Labs:  Recent Labs  08/26/15 1800 08/26/15 1900 08/27/15 0051 08/27/15 0537 08/27/15 0941  HGB 14.1  --   --  13.2 12.1*  HCT 43.1  --   --  39.9 39.4  PLT 132*  --   --  120* 132*  LABPROT 14.3  --   --   --   --   INR 1.09  --   --   --   --   CREATININE 0.82  --   --  0.73 0.85  CKTOTAL  --  64  --   --   --   TROPONINI 0.04*  --  0.04* 0.03  --     CrCl cannot be calculated (Unknown ideal weight.).   Medical History: Past Medical History  Diagnosis Date  . Sleep apnea   . Hypertension   . Depression   . Arthritis   . Urinary frequency   . Lumbar neuralgia   . Headache(784.0)   . Constipation due to pain medication   . Hepatitis   . Hepatitis B     "years ago"     Assessment: [age] yoM with with a history of HTN, morbid obesity, and OSA not on CPAP who [facility] with left hip and flank pain. He was found to have a left ureteral stone. He was also noted to be in new onset atrial fib/flutter.  He was admitted for new onset afib, urosepsis, and for emergent ureteral stent placements.  Pharmacy consulted by cardiology to start heparin infusion without bolus d/t urology procedure this morning.  Not on anticoagulants PTA.  Hgb 12.1, plts 132K (low at presentation) Baseline anticoag labs ordered Mildly elevated troponin - likely demand ischemia in setting of acute illness per cardiology  Goal of Therapy:  Heparin level 0.3-0.7 units/ml Monitor platelets by  anticoagulation protocol: Yes   Plan:  Start heparin infusion at 1500 units/hr. No bolus. Follow up baseline anticoag labs (PTT, PT/INR). Check heparin level 8 hours after start of infusion. Daily HL and CBC while on heparin infusion. F/u s/sxs of bleeding. Per Dr. Berneice HeinrichManny, should expect some hematuria.  Clance Bollunyon, Alaster Asfaw 08/27/2015,11:14 AM

## 2015-08-27 NOTE — Progress Notes (Signed)
  Echocardiogram 2D Echocardiogram with Definity has been performed.  Nolon RodBrown, Tony 08/27/2015, 3:29 PM

## 2015-08-27 NOTE — Transfer of Care (Signed)
Immediate Anesthesia Transfer of Care Note  Patient: [name]  Procedure(s) Performed: Procedure(s): CYSTOSCOPY WITH BILATERAL RETROGRADE PYELOGRAM, URETEROSCOPY AND BILATERAL STENT PLACEMENT (Left)  Patient Location: PACU and ICU  Anesthesia Type:General  Level of Consciousness: sedated  Airway & Oxygen Therapy: Patient remains intubated per anesthesia plan  Post-op Assessment: Report given to RN  Post vital signs: Reviewed and stable  Last Vitals:  Filed Vitals:   08/27/15 0700 08/27/15 0703  BP: 120/71 120/71  Pulse: 119 116  Temp:  40.4 C  Resp: 28 23    Complications: No apparent anesthesia complications

## 2015-08-27 NOTE — ED Notes (Signed)
Bed: RESB Expected date:  Expected time:  Means of arrival:  Comments: Tx from [facility]

## 2015-08-27 NOTE — Consult Note (Signed)
Urology Consult  CC: Referring physician: Dr. Kirke Shaggy. Ortiz Reason for referral: Left ureteral stone with flank pain.  History of Present Illness: [name] is a 75 year old male who is seen for further evaluation of left ureteral calculi. He presented to the emergency room reporting having fallen on Tuesday and injuring his left hip. He then developed pain in the flank region. The pain became increasingly severe and he presented to the emergency room. A CT scan was obtained which revealed stones in his distal ureter just above the ureterovesical junction and he was found to have no elevation of his white blood cell count, no fever and his urinalysis contained bacteria but there were no red or white cells noted. It was initially felt that pain control was indicated and possible outpatient management of his stones could possibly be undertaken if his pain was controlled and if not then his stone could be managed ureteroscopically. When I saw the patient he had developed tachycardia and fever to 102. He appeared to have become less responsive and I was unable to obtain any form of history from him. His history was obtained from the chart and the emergency room physician.   Past Medical History  Diagnosis Date  . Sleep apnea   . Hypertension   . Depression   . Arthritis   . Urinary frequency   . Lumbar neuralgia   . Headache(784.0)   . Constipation due to pain medication   . Hepatitis   . Hepatitis B     "years ago"   Past Surgical History  Procedure Laterality Date  . Back surgery      7 lumbar surgeries  . Appendectomy    . Joint replacement      Rt shoulder    Medications: Prior to Admission:  (Not in a hospital admission)  Allergies:  Allergies  Allergen Reactions  . Gabapentin Other (See Comments)    "makes me loopy"  . Ibuprofen Rash    Hives     History reviewed. No pertinent family history.  Social History:  reports that he has never smoked. His smokeless tobacco use  includes Snuff. He reports that he does not drink alcohol or use illicit drugs.  Review of Systems: Pertinent items are noted in HPI. A comprehensive review of systems was negative except As noted above   Physical Exam:  Vital signs in last 24 hours: Temp:  [102.8 F (39.3 Galvan)] 102.8 F (39.3 Galvan) (03/10 0538) Pulse Rate:  [41-129] 117 (03/10 0530) Resp:  [16-32] 25 (03/10 0530) BP: (116-186)/(68-113) 130/91 mmHg (03/10 0530) SpO2:  [83 %-100 %] 100 % (03/10 0530) General appearance: alert and appears stated age Head: Normocephalic, without obvious abnormality, atraumatic Eyes: conjunctivae/corneas clear. EOM's intact.  Oropharynx: moist mucous membranes Neck: supple, symmetrical, trachea midline Resp: normal respiratory effort Cardio: regular rate and rhythm Back: symmetric, no curvature. ROM normal. No CVA tenderness. GI: soft, non-tender; bowel sounds normal; no masses,  no organomegaly  Male genitalia: penis: normal male phallus with no lesions or discharge.Testes: bilaterally descended with no masses or tenderness. no hernias  Extremities: extremities normal, atraumatic, no cyanosis or edema Skin: Skin color normal. No visible rashes or lesions Neurologic: Grossly normal  Laboratory Data:   Recent Labs  08/26/15 1800  WBC 11.8*  HGB 14.1  HCT 43.1   BMET  Recent Labs  08/26/15 1800  NA 137  K 3.7  CL 99*  CO2 25  GLUCOSE 161*  BUN 22*  CREATININE 0.82  CALCIUM  9.5    Recent Labs  08/26/15 1800  INR 1.09   No results for input(s): LABURIN in the last 72 hours. Results for orders placed or performed during the hospital encounter of 01/03/12  Surgical pcr screen     Status: Abnormal   Collection Time: 01/03/12  3:56 PM  Result Value Ref Range Status   MRSA, PCR NEGATIVE NEGATIVE Final   Staphylococcus aureus POSITIVE (A) NEGATIVE Final    Comment:        The Xpert SA Assay (FDA approved for NASAL specimens only), is one component of a  comprehensive surveillance program.  It is not intended to diagnose infection nor to guide or monitor treatment.   Creatinine:  Recent Labs  08/26/15 1800  CREATININE 0.82    Imaging: Dg Chest 1 View  08/26/2015  CLINICAL DATA:  Fall 2 days prior, with back and left hip pain. EXAM: CHEST 1 VIEW COMPARISON:  Radiograph 01/03/2012 FINDINGS: Lung volumes are low leading to accentuation of the cardiac size and crowding of bronchovascular structures. No confluent airspace disease, large pleural effusion or pneumothorax. No acute osseous abnormalities are seen. IMPRESSION: Hypoventilatory chest accentuating cardiac size and crowding of bronchovascular structures. Recommend PA and lateral views when patient is able. Electronically Signed   By: Rubye Oaks M.D.   On: 08/26/2015 19:27   Dg Lumbar Spine Complete  08/26/2015  CLINICAL DATA:  Fall tuesday evening EXAM: LUMBAR SPINE - COMPLETE 4+ VIEW COMPARISON:  08/10/05 FINDINGS: Prior L1/2 fusion with hardware. Normal alignment. No acute fracture. Diffuse osseous fusion lumbar spine IMPRESSION: No acute findings. Electronically Signed   By: Esperanza Heir M.D.   On: 08/26/2015 19:27   Dg Hip Unilat With Pelvis 2-3 Views Left  08/26/2015  CLINICAL DATA:  Fall 2 days prior, persistent left hip pain. EXAM: DG HIP (WITH OR WITHOUT PELVIS) 2-3V LEFT COMPARISON:  None. FINDINGS: The cortical margins of the bony pelvis are intact. No fracture. Pubic symphysis and sacroiliac joints are congruent. Mild degenerative change of both hips. Both femoral heads are well-seated in the respective acetabula. IMPRESSION: No pelvic or left hip fracture. Electronically Signed   By: Rubye Oaks M.D.   On: 08/26/2015 19:25   Ct Cta Abd/pel W/cm &/or W/o Cm  08/26/2015  CLINICAL DATA:  Recent fall with extreme left-sided pain radiating to the back. Clinical concern for abdominal aortic aneurysm or dissection. EXAM: CTA ABDOMEN AND PELVIS wITHOUT AND WITH CONTRAST  TECHNIQUE: Multidetector CT imaging of the abdomen and pelvis was performed using the standard protocol during bolus administration of intravenous contrast. Multiplanar reconstructed images and MIPs were obtained and reviewed to evaluate the vascular anatomy. CONTRAST:  100 cc Omnipaque 350 IV COMPARISON:  Lumbar spine and hip radiographs earlier this day. FINDINGS: Lower chest: Atelectasis in the dependent right greater than left lower lobe. Heart upper limits of normal in size. Coronary artery calcifications. No pleural effusion. Liver: No acute traumatic injury. Prominent size. No evidence of focal lesion allowing for noncontrast and arterial phase imaging. Hepatobiliary: Distended gallbladder containing intraluminal gallstone. No pericholecystic inflammation. Prominence of the common bile duct measures 10 mm at the porta hepatis. Pancreas: Mild fatty atrophy. No ductal dilatation or inflammation. No evidence of traumatic injury. Spleen: Normal for arterial phase imaging. No perisplenic fluid. Spleen is enlarged measuring 15.7 cm cranial caudal. Adrenal glands: No nodule or hemorrhage. Kidneys: Symmetric renal enhancement. No hydronephrosis. No significant perinephric stranding. Nonobstructing bilateral renal stones. There are 2 adjacent 8 mm stones in  the distal left ureter at the ureterovesicular junction, no ureteral dilatation. Bilateral renal cysts. There is cortical scarring in the left kidney. Stomach/Bowel: Stomach is decompressed. There are no dilated or thickened small bowel loops. Cecum is medially displaced in the mid abdomen with prominent stool. Moderate stool throughout the remainder the colon. Distal colonic diverticulosis without diverticulitis. The appendix is not visualized. Vascular/Lymphatic: Normal caliber abdominal aorta without aneurysm or dissection. Mild tortuosity and atherosclerosis. No aortic hematoma, soft tissue stranding, or acute aortic abnormality. Celiac, superior and inferior  mesenteric arteries are patent. Single bilateral renal arteries are patent. Tortuosity of both common and external iliac arteries without aneurysmal dilatation. No retroperitoneal fluid. No retroperitoneal adenopathy. Reproductive: Normal sized prostate gland. Bladder: Minimally distended, no wall thickening. Other: No free air, free fluid, or intra-abdominal fluid collection. Tiny fat containing umbilical hernia. Musculoskeletal: There are no acute or suspicious osseous abnormalities. Extensive multilevel postsurgical change in the spine. Posterior hardware at L1-2 with rod and intrapedicular screws, the pedicular screws abut the T12-L1 disc space. No evidence of acute fracture allowing for degree of background chronic change. No fracture of the bony pelvis or hips. Probable bone harvest in the right iliac bone. Review of the MIP images confirms the above findings. IMPRESSION: 1. Normal caliber abdominal aorta without acute aortic abnormality or aortic aneurysm. 2. No evidence of acute traumatic abnormality in the abdomen/pelvis. 3. There are 2 adjacent distal left ureteric stones both measuring 8 mm. No proximal ureteral dilatation or frank hydronephrosis, however these may be intermittently obstructing and cause of patient's left-sided pain. There are nonobstructing stones in both kidneys. Electronically Signed   By: Rubye Oaks M.D.   On: 08/26/2015 20:39    CT scan images were reviewed.  Impression/Assessment:  1. Distal left ureteral calculi  - he has 2 stones in his distal left ureter measuring 6.5 and 7.5 mm in width. They're causing some hydronephrosis. He also has nonobstructing bilateral renal calculi. His urine did not appear infected but did have bacteria and he has now developed fever to 102.8 and is tachycardic but his blood pressure has remained stable. It is my opinion that emergent bypass of his stones with a double-J stent is indicated. I have attempted to communicate this to the patient  but he is apparently unable to comprehend.  2. Bilateral renal calculi - He has bilateral nonobstructing renal calculi which are currently of no clinical significance.  3. Possible UTI - He has bacturia but did not have significant red or white cells noted in his urine. His urine will need to be cultured and he will be placed on broad-spectrum antibiotics.  Plan:   He will undergo emergent cystoscopy with left double-J stent placement.   [name] 08/27/2015, 5:57 AM

## 2015-08-27 NOTE — Progress Notes (Signed)
ANTICOAGULATION CONSULT NOTE - Follow Up Consult  Pharmacy Consult for heparin Indication: atrial fibrillation  Allergies  Allergen Reactions  . Gabapentin Other (See Comments)    "makes me loopy"  . Ibuprofen Rash    Hives     Patient Measurements: Height: 6' (182.9 cm) Weight: 284 lb 13.4 oz (129.2 kg) IBW/kg (Calculated) : 77.6  TBW: 129.2 kg Heparin Dosing Weight: 107 kg  Vital Signs: Temp: 99.3 F (37.4 C) (03/10 2047) Temp Source: Axillary (03/10 2047)  Labs:  Recent Labs  08/26/15 1800 08/26/15 1900 08/27/15 0051 08/27/15 0537 08/27/15 0941 08/27/15 1245 08/27/15 2125  HGB 14.1  --   --  13.2 12.1*  --   --   HCT 43.1  --   --  39.9 39.4  --   --   PLT 132*  --   --  120* 132*  --   --   APTT  --   --   --   --   --  29  --   LABPROT 14.3  --   --   --   --  14.7  --   INR 1.09  --   --   --   --  1.17  --   HEPARINUNFRC  --   --   --   --   --   --  <0.10*  CREATININE 0.82  --   --  0.73 0.85 0.75  --   CKTOTAL  --  64  --   --   --   --   --   TROPONINI 0.04*  --  0.04* 0.03  --   --   --     Estimated Creatinine Clearance: 112.5 mL/min (by C-G formula based on Cr of 0.75).   Medical History: Past Medical History  Diagnosis Date  . Sleep apnea   . Hypertension   . Depression   . Arthritis   . Urinary frequency   . Lumbar neuralgia   . Headache(784.0)   . Constipation due to pain medication   . Hepatitis   . Hepatitis B     "years ago"     Assessment: [age] yoM with with a history of HTN, morbid obesity, and OSA not on CPAP who [facility] with left hip and flank pain. He was found to have a left ureteral stone. He was also noted to be in new onset atrial fib/flutter.  He was admitted for new onset afib, urosepsis, and for emergent ureteral stent placements.  Pharmacy consulted by cardiology to start heparin infusion without bolus d/t urology procedure this morning.  Not on anticoagulants PTA.  Heparin level = < 0.1 mcg/ml  with heparin @ 1500 units/hr Slight pink urine reported earlier; however pt s/p ureteral stents placed today. No other complications of therapy noted  Goal of Therapy:  Heparin level 0.3-0.7 units/ml Monitor platelets by anticoagulation protocol: Yes   Plan:  Will not bolus due to s/p surgery today Increase IV heparin to 1800 units/hr Check heparin level 8 hours after rate increase Daily HL and CBC while on heparin infusion. F/u s/sxs of bleeding. Per Dr. Berneice HeinrichManny, should expect some hematuria.  Latori Beggs, Joselyn GlassmanLeann Trefz, PharmD 08/27/2015,10:32 PM

## 2015-08-27 NOTE — Progress Notes (Signed)
eLink Physician-Brief Progress Note Patient Name: [name] DOB: 10-22-1940 MRN: [id number]   Date of Service  08/27/2015  HPI/Events of Note  Camera check on patient.Patient laying recumbent in bed eyes closed. Currently on ventilator. Minimal ventilator support with FiO2 0.4.Normotensive after IV fluid today.  eICU Interventions  Continue current plan of care and close monitoring in the intensive care unit.     Intervention Category Major Interventions: Respiratory failure - evaluation and management  Lawanda CousinsJennings Mirah Nevins 08/27/2015, 7:07 PM

## 2015-08-27 NOTE — Care Management Note (Signed)
Case Management Note  Patient Details  Name: [name] MRN: [id number] Date of Birth: [birth year]  Subjective/Objective:               Sepsis temp104.2, heart rate in 120's     Action/Plan:Date:  August 27, 2015 Chart reviewed for concurrent status and case management needs. Will continue to follow patient for changes and needs: Marcelle Smilinghonda Kameela Leipold, BSN, RN, [place]   [phone]   Expected Discharge Date:                  Expected Discharge Plan:  Home/Self Care  In-House Referral:  NA  Discharge planning Services  CM Consult  Post Acute Care Choice:  NA Choice offered to:  NA  DME Arranged:    DME Agency:     HH Arranged:    HH Agency:     Status of Service:  In process, will continue to follow  Medicare Important Message Given:    Date Medicare IM Given:    Medicare IM give by:    Date Additional Medicare IM Given:    Additional Medicare Important Message give by:     If discussed at Long Length of Stay Meetings, dates discussed:    Additional Comments:  Golda AcreDavis, Prosper Paff Lynn, RN 08/27/2015, 9:44 AM

## 2015-08-27 NOTE — Progress Notes (Signed)
CRITICAL VALUE ALERT  Critical value received:  Lactic Acid  Date of notification:  08/27/2015  Time of notification:  1050  Critical value read back:Yes.    Nurse who received alert:  Santiago GladAllison Dushawn Pusey, RN  MD notified (1st page):  Dr. Marchelle Gearingamaswamy  Time of first page:  Face to face at 1050

## 2015-08-27 NOTE — Anesthesia Postprocedure Evaluation (Signed)
Anesthesia Post Note  Patient: [name]  Procedure(s) Performed: Procedure(s) (LRB): CYSTOSCOPY WITH BILATERAL RETROGRADE PYELOGRAM, URETEROSCOPY AND BILATERAL STENT PLACEMENT (Left)  Patient location during evaluation: PACU Anesthesia Type: General Level of consciousness: sedated and patient remains intubated per anesthesia plan Pain management: pain level controlled Vital Signs Assessment: post-procedure vital signs reviewed and stable Respiratory status: patient remains intubated per anesthesia plan Cardiovascular status: stable Anesthetic complications: no    Last Vitals:  Filed Vitals:   08/27/15 1100 08/27/15 1200  BP:    Pulse:    Temp:  36.9 C  Resp: 20 20    Last Pain:  Filed Vitals:   08/27/15 1259  PainSc: 0-No pain                 Lewie LoronJohn [employer]

## 2015-08-27 NOTE — Consult Note (Addendum)
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 He had a fall on 08/24/2015 and after that had left low back and flank pain. He presented to the emergency room because of this pain on 08/26/2015. He was found to be in atrial fibrillation rapid ventricular rate and admitted to the hospitalist service but overnight became febrile with SIRS, abnormal urinalysis with bacteria and evaluation revealed stones in his distal ureter distal to the utero-vesical junction with some amount of left hydronephrosis. He also had nonobstructing renal calculi on the right side. On 08/27/2015 he underwent cystoscopy with retrograde pyelogram and ureteroscopy and stent placement on the left side. He he was transferred to the ICU with postoperative acute respiratory failure on the ventilator and requiring neosynephrine via peripheral vein. Critical care medicine now assuming primary service 08/27/2015    CULTURES: MRSA PCR 08/26/2015 - neg MSSA PRCR 08/26/2015 - POS Blood culture 3/10 Urine culture 3/10    ANTIBIOTICS: rocephin 3/10 >>3/10 vanc 3/10 > Zosyn 3/10 >> levaquin 3/10 >>3/10   LINES/TUBES: ett 3/10   SIGNIFICANT EVENTS: 08/26/2015 - admit 3/101/7 - OR   PAST MEDICAL HISTORY :  He  has a past medical history of Sleep apnea; Hypertension; Depression; Arthritis; Urinary frequency; Lumbar neuralgia; Headache(784.0); Constipation due to pain medication; Hepatitis; and Hepatitis B.  PAST SURGICAL HISTORY: He  has past surgical history that  includes Back surgery; Appendectomy; and Joint replacement.  Allergies  Allergen Reactions  . Gabapentin Other (See Comments)    "makes me loopy"  . Ibuprofen Rash    Hives     No current facility-administered medications on file prior to encounter.   No current outpatient prescriptions on file prior to encounter.    FAMILY HISTORY:  His has no family status information on file.   SOCIAL HISTORY: He  reports that he has never smoked. His smokeless tobacco use includes Snuff. He reports that he does not drink alcohol or use illicit drugs.  REVIEW OF SYSTEMS:   Per hpi. Patient unresponsive and could not elicit   VITAL SIGNS: BP 120/71 mmHg  Pulse 116  Temp(Src) 104.8 F (40.4 C) (Rectal)  Resp 23  SpO2 100%  HEMODYNAMICS:    VENTILATOR SETTINGS:    INTAKE / OUTPUT: I/O last 3 completed shifts: In: 1000 [I.V.:1000] Out: 150 [Urine:150]  PHYSICAL EXAMINATION: General:  Obese male intubated looks critically ill Neuro:  RA SS sedation score -3 immediate post anesthesia HEENT:  ET tube present Cardiovascular:  Normal heart sounds. Irregularly irregular Lungs:  Clear to auscultation bilaterally. Synchronous with the ventilator Abdomen:  Obese. Soft nontender. No organomegaly Musculoskeletal:  No cyanosis. No clubbing no edema Skin:  Intact  LABS:  PULMONARY No results for input(s): PHART, PCO2ART, PO2ART, HCO3, TCO2, O2SAT in the last 168 hours.  Invalid input(s): PCO2, PO2  CBC  Recent Labs Lab 08/26/15 1800 08/27/15 0537  HGB 14.1 13.2  HCT 43.1 39.9  WBC 11.8* 10.2  PLT 132* 120*    COAGULATION  Recent Labs Lab 08/26/15 1800  INR 1.09  CARDIAC   Recent Labs Lab 08/26/15 1800 08/27/15 0051 08/27/15 0537  TROPONINI 0.04* 0.04* 0.03   No results for input(s): PROBNP in the last 168 hours.   CHEMISTRY  Recent Labs Lab 08/26/15 1800 08/27/15 0051 08/27/15 0537  NA 137  --  136  K 3.7  --  3.8  CL 99*  --  100*  CO2 25   --  24  GLUCOSE 161*  --  178*  BUN 22*  --  22*  CREATININE 0.82  --  0.73  CALCIUM 9.5  --  8.7*  MG  --  1.9  --   PHOS  --  3.0  --    CrCl cannot be calculated (Unknown ideal weight.).   LIVER  Recent Labs Lab 08/26/15 1800 08/27/15 0537  AST 21 18  ALT 14* 15*  ALKPHOS 80 73  BILITOT 1.0 0.9  PROT 6.7 6.6  ALBUMIN 3.4* 3.0*  INR 1.09  --      INFECTIOUS No results for input(s): LATICACIDVEN, PROCALCITON in the last 168 hours.   ENDOCRINE CBG (last 3)  No results for input(s): GLUCAP in the last 72 hours.       IMAGING x48h  - image(s) personally visualized  -   highlighted in bold Dg Chest 1 View  08/26/2015  CLINICAL DATA:  Fall 2 days prior, with back and left hip pain. EXAM: CHEST 1 VIEW COMPARISON:  Radiograph 01/03/2012 FINDINGS: Lung volumes are low leading to accentuation of the cardiac size and crowding of bronchovascular structures. No confluent airspace disease, large pleural effusion or pneumothorax. No acute osseous abnormalities are seen. IMPRESSION: Hypoventilatory chest accentuating cardiac size and crowding of bronchovascular structures. Recommend PA and lateral views when patient is able. Electronically Signed   By: Rubye OaksMelanie  Ehinger M.D.   On: 08/26/2015 19:27   Dg Lumbar Spine Complete  08/26/2015  CLINICAL DATA:  Fall tuesday evening EXAM: LUMBAR SPINE - COMPLETE 4+ VIEW COMPARISON:  08/10/05 FINDINGS: Prior L1/2 fusion with hardware. Normal alignment. No acute fracture. Diffuse osseous fusion lumbar spine IMPRESSION: No acute findings. Electronically Signed   By: Esperanza Heiraymond  Rubner M.D.   On: 08/26/2015 19:27   Dg Chest Portable 1 View  08/27/2015  CLINICAL DATA:  Preop for cystoscopy. Left ureteral obstruction. Fever. EXAM: PORTABLE CHEST - 1 VIEW COMPARISON:  One-view chest x-ray 08/26/2015. FINDINGS: The heart size is exaggerated by low lung volumes. Mild interstitial coarsening is stable. Asymmetric opacification is again noted at the right lung  apex. Recommend continued follow-up with PA and lateral chest radiographs prior to discharge. IMPRESSION: 1. Persistent low lung volumes. 2. Asymmetric right apical airspace opacification. Recommend follow-up PA and lateral chest radiographs as the patient's condition allows. Electronically Signed   By: Marin Robertshristopher  Mattern M.D.   On: 08/27/2015 07:19   Dg Hip Unilat With Pelvis 2-3 Views Left  08/26/2015  CLINICAL DATA:  Fall 2 days prior, persistent left hip pain. EXAM: DG HIP (WITH OR WITHOUT PELVIS) 2-3V LEFT COMPARISON:  None. FINDINGS: The cortical margins of the bony pelvis are intact. No fracture. Pubic symphysis and sacroiliac joints are congruent. Mild degenerative change of both hips. Both femoral heads are well-seated in the respective acetabula. IMPRESSION: No pelvic or left hip fracture. Electronically Signed   By: Rubye OaksMelanie  Ehinger M.D.   On: 08/26/2015 19:25   Ct Cta Abd/pel W/cm &/or W/o Cm  08/26/2015  CLINICAL DATA:  Recent fall with extreme left-sided pain radiating to the back. Clinical concern for  abdominal aortic aneurysm or dissection. EXAM: CTA ABDOMEN AND PELVIS wITHOUT AND WITH CONTRAST TECHNIQUE: Multidetector CT imaging of the abdomen and pelvis was performed using the standard protocol during bolus administration of intravenous contrast. Multiplanar reconstructed images and MIPs were obtained and reviewed to evaluate the vascular anatomy. CONTRAST:  100 cc Omnipaque 350 IV COMPARISON:  Lumbar spine and hip radiographs earlier this day. FINDINGS: Lower chest: Atelectasis in the dependent right greater than left lower lobe. Heart upper limits of normal in size. Coronary artery calcifications. No pleural effusion. Liver: No acute traumatic injury. Prominent size. No evidence of focal lesion allowing for noncontrast and arterial phase imaging. Hepatobiliary: Distended gallbladder containing intraluminal gallstone. No pericholecystic inflammation. Prominence of the common bile duct  measures 10 mm at the porta hepatis. Pancreas: Mild fatty atrophy. No ductal dilatation or inflammation. No evidence of traumatic injury. Spleen: Normal for arterial phase imaging. No perisplenic fluid. Spleen is enlarged measuring 15.7 cm cranial caudal. Adrenal glands: No nodule or hemorrhage. Kidneys: Symmetric renal enhancement. No hydronephrosis. No significant perinephric stranding. Nonobstructing bilateral renal stones. There are 2 adjacent 8 mm stones in the distal left ureter at the ureterovesicular junction, no ureteral dilatation. Bilateral renal cysts. There is cortical scarring in the left kidney. Stomach/Bowel: Stomach is decompressed. There are no dilated or thickened small bowel loops. Cecum is medially displaced in the mid abdomen with prominent stool. Moderate stool throughout the remainder the colon. Distal colonic diverticulosis without diverticulitis. The appendix is not visualized. Vascular/Lymphatic: Normal caliber abdominal aorta without aneurysm or dissection. Mild tortuosity and atherosclerosis. No aortic hematoma, soft tissue stranding, or acute aortic abnormality. Celiac, superior and inferior mesenteric arteries are patent. Single bilateral renal arteries are patent. Tortuosity of both common and external iliac arteries without aneurysmal dilatation. No retroperitoneal fluid. No retroperitoneal adenopathy. Reproductive: Normal sized prostate gland. Bladder: Minimally distended, no wall thickening. Other: No free air, free fluid, or intra-abdominal fluid collection. Tiny fat containing umbilical hernia. Musculoskeletal: There are no acute or suspicious osseous abnormalities. Extensive multilevel postsurgical change in the spine. Posterior hardware at L1-2 with rod and intrapedicular screws, the pedicular screws abut the T12-L1 disc space. No evidence of acute fracture allowing for degree of background chronic change. No fracture of the bony pelvis or hips. Probable bone harvest in the  right iliac bone. Review of the MIP images confirms the above findings. IMPRESSION: 1. Normal caliber abdominal aorta without acute aortic abnormality or aortic aneurysm. 2. No evidence of acute traumatic abnormality in the abdomen/pelvis. 3. There are 2 adjacent distal left ureteric stones both measuring 8 mm. No proximal ureteral dilatation or frank hydronephrosis, however these may be intermittently obstructing and cause of patient's left-sided pain. There are nonobstructing stones in both kidneys. Electronically Signed   By: Rubye Oaks M.D.   On: 08/26/2015 20:39        DISCUSSION: Urosepsis with postoperative respiratory failure  ASSESSMENT / PLAN:  PULMONARY A: #Background - Underlying history of OSA not otherwise specified. Details not known. Apparently does not use CPAP - Unclear history of smoking  #Current Acute postoperative respiratory failure P:   Full mechanical ventilator support As needed sedation Check blood gas   CARDIOVASCULAR A:  Presented with atrial fibrillation. Presumed to be new. Details not known. Currently coming off Neo-Synephrine for perioperative circular to shock  P:  Hydrate NS 100cc/h Await echo Titrate Neo-Synephrine for mean arterial pressure goal greater than 65 Dc orders for Cardizem and Lopressor Await echocardiogram Appreciate cardiology consultation  RENAL  A:   - Bilateral renal calculi with left sided obstruction and left hydronephrosis but with normal creatinine at presentation - Status post stent placement  P:   Per urology Monitor electrolytes  GASTROINTESTINAL A:   - History of hepatitis B not otherwise specified  P:   NG tube Tube  feeds 08/28/2015 if not extubated Stress ulcer prophylaxis  HEMATOLOGIC A:   At risk for anemia of critical illness P:  DVT prophylaxis Monitor for bleeding Packed red blood cell for hemoglobin less than 7 g percent for anemia of critical illness  INFECTIOUS A:   UTI  sepsis P:   Broad antibiotics as above  ENDOCRINE A:   At risk for hyperglycemia   P:   IC hyperglycemia protocol  NEUROLOGIC A:   #Baseline -History of depression on Cymbalta - hx of chronic pain - nos  #Current - Flank pain due to ureteral calculii  P:   Continue Cymbalta for now - monitor QTC Fentanyl and Versed when necessary RASS goal: 0 to -2    FAMILY  - Updates: No family at bedside  - Inter-disciplinary family meet or Palliative Care meeting due by:  09/03/2015 which is day 7   D/w Carlean Jews cards APP and Dr Berneice Heinrich of urology - both at bedside     The patient is critically ill with multiple organ systems failure and requires high complexity decision making for assessment and support, frequent evaluation and titration of therapies, application of advanced monitoring technologies and extensive interpretation of multiple databases.   Critical Care Time devoted to patient care services described in this note is  45  Minutes. This time reflects time of care of this signee Dr Kalman Shan. This critical care time does not reflect procedure time, or teaching time or supervisory time of PA/NP/Med student/Med Resident etc but could involve care discussion time    Dr. Kalman Shan, M.D., [facility] Pulmonary and Critical Care Medicine Staff Physician Medicine [facility] [facility] Pager: [phone], If no answer or between  15:00h - 7:00h: call [phone]  08/27/2015 10:08 AM

## 2015-08-27 NOTE — H&P (Addendum)
[facility] History and Physical  CULLY LUCKOW ZOX:[id number] DOB: 14-Jan-1941 DOA: 08/26/2015  Referring physician: Lula Olszewski, MD PCP: PROVIDER NOT IN SYSTEM   Chief Complaint: Left hip and left Flank pain.  HPI: [name] is a 75 y.o. 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  Per patient, he had a fall on Tuesday that cost in left lower back and left hip pain. He is states that he was able to do basic things at home and ambulate, but states that movement caused significant pain. He is states that the pain was so intense this evening, that he called EMS.  Workup in the ER is significant for left nephrolithiasis and ureterolithiasis. Telemetry and EKG showed atrial fibrillation with RVR in the low 100s. The patient denies chest pain, palpitations, dyspnea, PND, orthopnea or pitting edema of the lower extremities. He is currently in no acute distress and states that his pain is much better now.   Review of Systems:  Constitutional:  Positive fatigue. No weight loss, night sweats, Fevers, chills. HEENT:  No headaches, Difficulty swallowing,Tooth/dental problems,Sore throat,  No sneezing, itching, ear ache, nasal congestion, post nasal drip,  Cardio-vascular:  Positive diaphoresis and mild dizziness with flank pain. No chest pain, Orthopnea, PND, swelling in lower extremities, anasarca, palpitations  GI:  Positive mild nausea and loss of appetite. No heartburn, indigestion, abdominal painvomiting, diarrhea, change in bowel habits Resp:  No shortness of breath with exertion or at rest. No excess mucus, no productive cough, No non-productive cough, No coughing up of blood.No change in color of mucus.No wheezing.No chest wall deformity  Skin:  no rash or  lesions.  GU:  Positive flank pain.  no dysuria, change in color of urine, no urgency or frequency.  Musculoskeletal:  Occasional arthralgias. Chronic back pain.  Psych:  History of depression  No change in mood or affect. or anxiety. No memory loss.   Past Medical History  Diagnosis Date  . Sleep apnea   . Hypertension   . Depression   . Arthritis   . Urinary frequency   . Lumbar neuralgia   . Headache(784.0)   . Constipation due to pain medication   . Hepatitis   . Hepatitis B     "years ago"   Past Surgical History  Procedure Laterality Date  . Back surgery      7 lumbar surgeries  . Appendectomy    . Joint replacement      Rt shoulder   Social History:  reports that he has never smoked. His smokeless tobacco use includes Snuff. He reports that he does not drink alcohol or use illicit drugs.  Allergies  Allergen Reactions  . Gabapentin Other (See Comments)    "makes me loopy"  . Ibuprofen Rash    Hives     History reviewed. No pertinent family history.   Prior to Admission medications   Medication Sig Start Date End Date Taking? Authorizing Provider  celecoxib (CELEBREX) 100 MG capsule Take 100 mg by mouth 2 (two) times daily.   Yes Historical Provider, MD  Cholecalciferol (VITAMIN D) 2000 units tablet Take 2,000 Units by mouth daily.   Yes Historical Provider, MD  ciprofloxacin (CIPRO) 500 MG tablet Take 500 mg by mouth 2 (two) times daily.   Yes Historical Provider, MD  DULoxetine (  CYMBALTA) 30 MG capsule Take 30 mg by mouth daily. Take with 60 mg = 90 mg total   Yes Historical Provider, MD  DULoxetine (CYMBALTA) 60 MG capsule Take 60 mg by mouth daily. Take with 30 mg = 90 mg total   Yes Historical Provider, MD  HYDROcodone-acetaminophen (NORCO) 10-325 MG tablet Take 1 tablet by mouth every 6 (six) hours as needed for moderate pain.   Yes Historical Provider, MD  tamsulosin (FLOMAX) 0.4 MG CAPS capsule Take 0.4 mg by mouth at bedtime.   Yes Historical  Provider, MD   Physical Exam: Filed Vitals:   08/27/15 0115 08/27/15 0130 08/27/15 0145 08/27/15 0204  BP: 129/79 152/87 125/79 134/78  Pulse: 95 101 129 103  Resp: 32 24 23 18   SpO2: 97% 97% 90% 99%    Wt Readings from Last 3 Encounters:  01/03/12 97.2 kg (214 lb 4.6 oz)    General:  Appears calm and comfortable Eyes: PERRL, normal lids, irises & conjunctiva ENT: grossly normal hearing, lips & tongue Neck: no LAD, masses or thyromegaly Cardiovascular: Irregularly irregular, no m/r/g. No LE edema. Telemetry: Atrial fibrillation. Respiratory: CTA bilaterally, no w/r/r. Normal respiratory effort. Abdomen: BS+, soft, positive left LQ tenderness, no guarding, no rebound. Skin: no rash or induration seen on limited exam Musculoskeletal: grossly normal tone BUE/BLE Psychiatric: grossly normal mood and affect, speech fluent and appropriate Neurologic: Awake, Alert, oriented x3, grossly non-focal.          Labs on Admission:  Basic Metabolic Panel:  Recent Labs Lab 08/26/15 1800 08/27/15 0051  NA 137  --   K 3.7  --   CL 99*  --   CO2 25  --   GLUCOSE 161*  --   BUN 22*  --   CREATININE 0.82  --   CALCIUM 9.5  --   MG  --  1.9  PHOS  --  3.0   Liver Function Tests:  Recent Labs Lab 08/26/15 1800  AST 21  ALT 14*  ALKPHOS 80  BILITOT 1.0  PROT 6.7  ALBUMIN 3.4*   CBC:  Recent Labs Lab 08/26/15 1800  WBC 11.8*  NEUTROABS 10.1*  HGB 14.1  HCT 43.1  MCV 87.8  PLT 132*   Cardiac Enzymes:  Recent Labs Lab 08/26/15 1800 08/26/15 1900 08/27/15 0051  CKTOTAL  --  64  --   TROPONINI 0.04*  --  0.04*    Radiological Exams on Admission: Dg Chest 1 View  08/26/2015  CLINICAL DATA:  Fall 2 days prior, with back and left hip pain. EXAM: CHEST 1 VIEW COMPARISON:  Radiograph 01/03/2012 FINDINGS: Lung volumes are low leading to accentuation of the cardiac size and crowding of bronchovascular structures. No confluent airspace disease, large pleural effusion or  pneumothorax. No acute osseous abnormalities are seen. IMPRESSION: Hypoventilatory chest accentuating cardiac size and crowding of bronchovascular structures. Recommend PA and lateral views when patient is able. Electronically Signed   By: Rubye OaksMelanie  Ehinger M.D.   On: 08/26/2015 19:27   Dg Lumbar Spine Complete  08/26/2015  CLINICAL DATA:  Fall tuesday evening EXAM: LUMBAR SPINE - COMPLETE 4+ VIEW COMPARISON:  08/10/05 FINDINGS: Prior L1/2 fusion with hardware. Normal alignment. No acute fracture. Diffuse osseous fusion lumbar spine IMPRESSION: No acute findings. Electronically Signed   By: Esperanza Heiraymond  Rubner M.D.   On: 08/26/2015 19:27   Dg Hip Unilat With Pelvis 2-3 Views Left  08/26/2015  CLINICAL DATA:  Fall 2 days prior, persistent left hip pain. EXAM:  DG HIP (WITH OR WITHOUT PELVIS) 2-3V LEFT COMPARISON:  None. FINDINGS: The cortical margins of the bony pelvis are intact. No fracture. Pubic symphysis and sacroiliac joints are congruent. Mild degenerative change of both hips. Both femoral heads are well-seated in the respective acetabula. IMPRESSION: No pelvic or left hip fracture. Electronically Signed   By: Rubye Oaks M.D.   On: 08/26/2015 19:25   Ct Cta Abd/pel W/cm &/or W/o Cm  08/26/2015  CLINICAL DATA:  Recent fall with extreme left-sided pain radiating to the back. Clinical concern for abdominal aortic aneurysm or dissection. EXAM: CTA ABDOMEN AND PELVIS wITHOUT AND WITH CONTRAST TECHNIQUE: Multidetector CT imaging of the abdomen and pelvis was performed using the standard protocol during bolus administration of intravenous contrast. Multiplanar reconstructed images and MIPs were obtained and reviewed to evaluate the vascular anatomy. CONTRAST:  100 cc Omnipaque 350 IV COMPARISON:  Lumbar spine and hip radiographs earlier this day. FINDINGS: Lower chest: Atelectasis in the dependent right greater than left lower lobe. Heart upper limits of normal in size. Coronary artery calcifications. No pleural  effusion. Liver: No acute traumatic injury. Prominent size. No evidence of focal lesion allowing for noncontrast and arterial phase imaging. Hepatobiliary: Distended gallbladder containing intraluminal gallstone. No pericholecystic inflammation. Prominence of the common bile duct measures 10 mm at the porta hepatis. Pancreas: Mild fatty atrophy. No ductal dilatation or inflammation. No evidence of traumatic injury. Spleen: Normal for arterial phase imaging. No perisplenic fluid. Spleen is enlarged measuring 15.7 cm cranial caudal. Adrenal glands: No nodule or hemorrhage. Kidneys: Symmetric renal enhancement. No hydronephrosis. No significant perinephric stranding. Nonobstructing bilateral renal stones. There are 2 adjacent 8 mm stones in the distal left ureter at the ureterovesicular junction, no ureteral dilatation. Bilateral renal cysts. There is cortical scarring in the left kidney. Stomach/Bowel: Stomach is decompressed. There are no dilated or thickened small bowel loops. Cecum is medially displaced in the mid abdomen with prominent stool. Moderate stool throughout the remainder the colon. Distal colonic diverticulosis without diverticulitis. The appendix is not visualized. Vascular/Lymphatic: Normal caliber abdominal aorta without aneurysm or dissection. Mild tortuosity and atherosclerosis. No aortic hematoma, soft tissue stranding, or acute aortic abnormality. Celiac, superior and inferior mesenteric arteries are patent. Single bilateral renal arteries are patent. Tortuosity of both common and external iliac arteries without aneurysmal dilatation. No retroperitoneal fluid. No retroperitoneal adenopathy. Reproductive: Normal sized prostate gland. Bladder: Minimally distended, no wall thickening. Other: No free air, free fluid, or intra-abdominal fluid collection. Tiny fat containing umbilical hernia. Musculoskeletal: There are no acute or suspicious osseous abnormalities. Extensive multilevel postsurgical  change in the spine. Posterior hardware at L1-2 with rod and intrapedicular screws, the pedicular screws abut the T12-L1 disc space. No evidence of acute fracture allowing for degree of background chronic change. No fracture of the bony pelvis or hips. Probable bone harvest in the right iliac bone. Review of the MIP images confirms the above findings. IMPRESSION: 1. Normal caliber abdominal aorta without acute aortic abnormality or aortic aneurysm. 2. No evidence of acute traumatic abnormality in the abdomen/pelvis. 3. There are 2 adjacent distal left ureteric stones both measuring 8 mm. No proximal ureteral dilatation or frank hydronephrosis, however these may be intermittently obstructing and cause of patient's left-sided pain. There are nonobstructing stones in both kidneys. Electronically Signed   By: Rubye Oaks M.D.   On: 08/26/2015 20:39    EKG: Independently reviewed. Vent. rate 114 BPM PR interval * ms QRS duration 98 ms QT/QTc 342/471 ms P-R-T axes  250 10 85 Atrial flutter with variable A-V block with premature ventricular or aberrantly conducted complexes Abnormal ECG  Assessment/Plan Principal Problem:   Urolithiasis   Left nephrolithiasis Admit to inpatient/telemetry. Continue gentle IV hydration. Continue analgesics as needed. Continue antiemetics as needed. Levaquin 750 mg IVP. Urology will be seen the patient in the morning.  Active Problems:   New onset atrial fibrillation (HCC)   Or newly diagnosed atrial fibrillation The patient at times is in the low 100s Start metoprolol 5 mg IVP. We will start on oral metoprolol as well. Start Cardizem if it worsens or no improvement.  Optimize potassium and magnesium. Check preop echocardiogram in a.m. When anticoagulation was discussed, the patient declined at the moment. He will likely need a retrograde cytoscopy with possible stent placement.    Elevated troponin level Likely due to Afib -induced troponin  leak. Trend troponin levels.    Lumbar pseudoarthrosis   Chronic back pain Continue Celebrex 100 mg by mouth twice a day. Continue Norco every 6 hours as needed.      Hypertension Start Metoprolol and monitor blood pressure.    OSA (obstructive sleep apnea) Not using CPAP at home. The patient declined CPAP while in the hospital.    Depression Stable. Continue Cymbalta.   Urology was consulted by the emergency department.   Code Status: Full code. DVT Prophylaxis: Lovenox SQ. Family Communication:  Disposition Plan: Admit for pain control and rate control management. Urology to evaluate in a.m.  Time spent: Over 70 minutes were spent in the process of his admission.  Bobette Mo, M.D. [facility] Pager [phone].

## 2015-08-27 NOTE — Op Note (Signed)
[name], [name] NO.:  [id number]  MEDICAL RECORD NO.:  [id number]  LOCATION:  WOTF                         FACILITY:  [facility]  PHYSICIAN:  Travis Ache, MD     DATE OF BIRTH:  August 13, 1940  DATE OF PROCEDURE: 08/27/2015                              OPERATIVE REPORT  PREOPERATIVE DIAGNOSES: 1. Left ureteral stone, presumed urosepsis. 2. Right renal stones.  PROCEDURE: 1. Cystoscopy with bilateral pyelograms and interpretation. 2. Insertion of bilateral ureteral stents, 6 x 26, contour no tether.  ESTIMATED BLOOD LOSS:  Nil.  COMPLICATION:  None.  SPECIMEN:  None.  FINDINGS: 1. Very mild left hydroureteronephrosis to filling defect and     opacification of Travis distal ureter consistent with stone. 2. Unremarkable right retrograde pyelogram. 3. Successful placement of bilateral ureteral stents, proximal in     renal pelvis and distal in urinary bladder.  INDICATION:  [name] is a 75 year old gentleman, who was found on workup of colicky abdominal pain to have a left distal ureteral stone. Yesterday at [facility], he had no systemic infectious parameters at that time, but did have some arrhythmia.  He was admitted to Travis hospitalist service overnight.  He acutely developed clinical decompensation and fevers worsening left flank pain worrisome for likely urosepsis.  He was evaluated by my colleague, Dr. Roselee Nova, he felt that urgent renal decompression was warranted as there was minimal hydronephrosis that stenting would be most advantageous.  Due to simultaneous GU surgical emergencies, I agreed to perform stenting emergent.  Consent obtained and placed in medical record.  PROCEDURE IN DETAIL:  [name] being [name], procedure being a cysto left retrograde, left ureteroscopic stone manipulation was confirmed.  Procedure was carried out.  Time-out was performed. Intravenous antibiotics were administered.  General  endotracheal anesthesia introduced.  Arterial line dual venous access obtained.  Time- out was performed.  Verification of intravenous antibiotics administered.  He was placed into a low lithotomy position.  Sterile field was created by prepping [name]'s penis, perineum, proximal thighs using iodine x3.  Next, cystourethroscopy was performed.  A 23- French rigid cystoscope with 30-degree offset lens.  Inspection of anterior-posterior unremarkable.  Inspection of bladder revealed no diverticula or papular lesions.  There was some scant calcified debris in Travis urinary bladder with no obvious stones.  Ureteral orifices appeared singleton bilaterally.  Travis left ureteral orifice was cannulated 6-French catheter and very gentle left pyelogram was obtained.  Before injection, there was a linear string opacification consistent with known stones in Travis distal ureter.  Very gentle retrograde pyelogram demonstrated a single left ureter, single system left kidney. No significant hydronephrosis noted and filling defect as mentioned in Travis distal ureter.  A 0.038 zip wire was advanced to Travis upper pole over which a new 6 x 26 contour type stent was placed.  Good proximal distal deployment were noted.  Efflux of urine was seen around into Travis distal end of Travis stent.  This was not thick or grossly purulent.  Given [name]'s tenuous clinical status and Travis fact that he does have known right intrarenal stone, it was felt that right stenting would be warranted  to prevent any right-sided obstruction given his known right intrarenal stone.  As such, Travis right ureteral orifice.  Candidacy for catheter retrocardiac obtained.  Right retrograde pyelogram demonstrates a single right ureter with single system right kidney.  No filling defects or narrowing noted.  A 0.038 zip wire was advanced all Travis upper pole over which, a new 6 x 26 contour type stent was placed.  Good proximal distal deployment  were noted.  Travis cystoscope was exchanged for an 18-French Foley catheter which was placed to straight drain.  A 10 mL of water in Travis balloon. Procedure was then terminated.  [name] tolerated Travis procedure reasonably well.  He was then transferred to Travis ICU, intubated for further acute care.  We have discussed [name] with Travis ICU team preoperatively and agreed on comanagement.          ______________________________ Travis Acheheodore Lenna Hagarty, MD     TM/MEDQ  D:  08/27/2015  T:  08/27/2015  Job:  [id number]

## 2015-08-27 NOTE — ED Notes (Signed)
Pt soiled brief, pt changed and cleaned and new brief on

## 2015-08-27 NOTE — ED Notes (Signed)
[facility] with snoring respirations, obtunded, will attempt to open eyes when name called.  Patient is in atrial fib with RVR, rectal temp of 104.8. Patient yells with noxious stimuli. Tylenol 1 gram infused IV.  Evaluated by EDP and Dr. Ronal Fearttlein aware patient is here.  OR called and wants patient at this time

## 2015-08-27 NOTE — Progress Notes (Signed)
Patient ID: Kayren EavesBuford L Ramnath, male   DOB: [birth year], 75 y.o.   MRN: [id number] Patient examined chart reviewed See full note by PA Currently intubated post uretal stent BP stable Afib/flutter rates 90-100 Obese no murmurs on exam Needs CXR post intubation being done Spoke with urology and ok to start heparin will not bolus To minimize hematuria Can add digoxin or low dose cardizem for rate control if needed over weekend Suspect echo images will be poor do to body habitus  Charlton HawsPeter Zacory Fiola

## 2015-08-27 NOTE — Anesthesia Procedure Notes (Signed)
Procedure Name: Intubation Date/Time: 08/27/2015 7:57 AM Performed by: Ludwig LeanJONES, Rayanne Padmanabhan C Pre-anesthesia Checklist: Patient identified, Emergency Drugs available, Suction available and Patient being monitored Patient Re-evaluated:Patient Re-evaluated prior to inductionOxygen Delivery Method: Circle System Utilized Preoxygenation: Pre-oxygenation with 100% oxygen Intubation Type: IV induction, Rapid sequence and Cricoid Pressure applied Ventilation: Mask ventilation without difficulty Laryngoscope Size: Glidescope and 3 Grade View: Grade I Tube type: Oral Laser Tube: Cuffed inflated with minimal occlusive pressure - saline Tube size: 8.0 mm Number of attempts: 1 Airway Equipment and Method: Oral airway and Rigid stylet Placement Confirmation: ETT inserted through vocal cords under direct vision,  positive ETCO2 and breath sounds checked- equal and bilateral Secured at: 24 cm Tube secured with: Tape Dental Injury: Teeth and Oropharynx as per pre-operative assessment

## 2015-08-27 NOTE — Progress Notes (Signed)
Pharmacy Antibiotic Note  [name] is a 75 y.o. male admitted on 08/26/2015 with urosepsis.  Pharmacy has been consulted for Vancomycin and Zosyn dosing.  Pt found to have left ureteral stone, right renal stones and is now s/p cystoscopy and b/l stent placement 3/10.  Vanc 1g and Zosyn 3.375g x 1 given in ED.   Plan: Give an additional 1g dose of vancomycin to provide a total 2g loading dose in this obese individual. Then start Vanc 1250 mg IV q12h. Start Zosyn 3.375g IV q8h (4 hour infusion time).  F/u SCr, trough levels as indicated, culture results.  Height: 6' (182.9 cm) IBW/kg (Calculated) : 77.6  Temp (24hrs), Avg:103.1 F (39.5 C), Min:101.8 F (38.8 C), Max:104.8 F (40.4 C)   Recent Labs Lab 08/26/15 1800 08/27/15 0537 08/27/15 0941  WBC 11.8* 10.2 16.6*  CREATININE 0.82 0.73 0.85  LATICACIDVEN  --   --  2.4*    CrCl cannot be calculated (Unknown ideal weight.).    Allergies  Allergen Reactions  . Gabapentin Other (See Comments)    "makes me loopy"  . Ibuprofen Rash    Hives     Antimicrobials this admission: 3/10  Vancomycin >>  3/10 Zosyn >>   Dose adjustments this admission: -  Microbiology results: 3/10 BCx: sent 3/10 UCx: sent  3/10 MRSA PCR: sent  Thank you for allowing pharmacy to be a part of this patient's care.  Clance BollRunyon, Xyon Lukasik 08/27/2015 11:29 AM

## 2015-08-27 NOTE — ED Notes (Signed)
Pt unable to sign transfer form due to altered mental status.

## 2015-08-27 NOTE — Progress Notes (Signed)
Pharmacy Antibiotic Note  [name] is a 75 y.o. male admitted on 08/26/2015 with UTI.  Pharmacy has been consulted for Levaquin dosing.  Plan: Levaquin 500mg  IV Q24H.   Recent Labs Lab 08/26/15 1800  WBC 11.8*  CREATININE 0.82     Allergies  Allergen Reactions  . Gabapentin Other (See Comments)    "makes me loopy"  . Ibuprofen Rash    Hives     Thank you for allowing pharmacy to be a part of this patient's care.  [name], PharmD, BCPS  08/27/2015 5:07 AM

## 2015-08-27 NOTE — Brief Op Note (Signed)
08/26/2015 - 08/27/2015  8:24 AM  PATIENT:  [name]  75 y.o. male  PRE-OPERATIVE DIAGNOSIS:  fever 102 obstruction  POST-OPERATIVE DIAGNOSIS:  * No post-op diagnosis entered *  PROCEDURE:  Procedure(s): CYSTOSCOPY WITH RETROGRADE PYELOGRAM, URETEROSCOPY AND STENT PLACEMENT (Left)  SURGEON:  Surgeon(s) and Role:    * Sebastian Acheheodore Gabriele Zwilling, MD - Primary    * Ihor GullyMark Ottelin, MD  PHYSICIAN ASSISTANT:   ASSISTANTS: none   ANESTHESIA:   general  EBL:     BLOOD ADMINISTERED:none  DRAINS: 56F foley to gravity   LOCAL MEDICATIONS USED:  NONE  SPECIMEN:  No Specimen  DISPOSITION OF SPECIMEN:  N/A  COUNTS:  YES  TOURNIQUET:  * No tourniquets in log *  DICTATION: .Other Dictation: Dictation Number [id number]  PLAN OF CARE: Admit to inpatient   PATIENT DISPOSITION:  PACU - hemodynamically stable.   Delay start of Pharmacological VTE agent (>24hrs) due to surgical blood loss or risk of bleeding: yes

## 2015-08-27 NOTE — Anesthesia Preprocedure Evaluation (Addendum)
Anesthesia Evaluation  Patient identified by MRN, date of birth, ID band Patient awake    Reviewed: Allergy & Precautions, H&P , NPO status , Patient's Chart, lab work & pertinent test results, reviewed documented beta blocker date and time   Airway Mallampati: II  TM Distance: <3 FB Neck ROM: full    Dental  (+) Poor Dentition, Dental Advisory Given   Pulmonary sleep apnea and Continuous Positive Airway Pressure Ventilation ,    breath sounds clear to auscultation       Cardiovascular hypertension, Pt. on medications  Rhythm:Regular Rate:Normal     Neuro/Psych  Headaches, PSYCHIATRIC DISORDERS Depression  Neuromuscular disease    GI/Hepatic (+) Hepatitis -, B  Endo/Other  Morbid obesity  Renal/GU Renal disease     Musculoskeletal  (+) Arthritis ,   Abdominal (+)  Abdomen: soft. Bowel sounds: normal.  Peds  Hematology   Anesthesia Other Findings   Reproductive/Obstetrics                            Anesthesia Physical  Anesthesia Plan  ASA: III and emergent  Anesthesia Plan: General   Post-op Pain Management:    Induction: Intravenous, Rapid sequence and Cricoid pressure planned  Airway Management Planned: Oral ETT  Additional Equipment:   Intra-op Plan:   Post-operative Plan: Post-operative intubation/ventilation  Informed Consent: I have reviewed the patients History and Physical, chart, labs and discussed the procedure including the risks, benefits and alternatives for the proposed anesthesia with the patient or authorized representative who has indicated his/her understanding and acceptance.   Dental advisory given  Plan Discussed with: CRNA  Anesthesia Plan Comments:        Anesthesia Quick Evaluation

## 2015-08-27 NOTE — Progress Notes (Signed)
260ml of Fentanyl wasted in sink, witnessed by Sterling BigMolly Dimola, RN.

## 2015-08-27 NOTE — Progress Notes (Signed)
Pt with rapid clinical decompensation. Multiple GU surgical emergencies occuring simultaneously.   Dr. Vernie Ammonsttelin and I discussed case and I will perform emergent cysto, left retrograde, stent with goal of GU decompression from working giagnosis severe urosepsis with obstruction. Minimal hydro makes stent first choice. Frankly discussed with family will need ICU admission, likely remain intubated post-op. Case declared emergency.

## 2015-08-27 NOTE — Progress Notes (Signed)
Initial Nutrition Assessment  DOCUMENTATION CODES:   Morbid obesity  INTERVENTION:  -RD to continue to monitor for needs  NUTRITION DIAGNOSIS:   Inadequate oral intake related to inability to eat as evidenced by NPO status.  GOAL:   Patient will meet greater than or equal to 90% of their needs  MONITOR:   Vent status, Diet advancement, Labs, I & O's, Skin  REASON FOR ASSESSMENT:   Ventilator    ASSESSMENT:   [name] is a 75 y.o. 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  Patient is currently intubated on ventilator support MV: 10.4 L/min Temp (24hrs), Avg:103.1 F (39.5 C), Min:101.8 F (38.8 C), Max:104.8 F (40.4 C)  Propofol: None  Medications: Versed PRN, Fentanyl PRN  Spoke with Mr. [name]'s family at bedside. No complains r/t to appetite, weight loss, nausea, vomiting, food access. Was eating regularly. Was intubated with urosepsis and post-op ARF following placement of a uretal stent.  Off pressors now. Likely wont be intubated for long per MD.  Labs: CBGs: 125-130, BUN 28, Ca 8.2  Diet Order:     Skin:  Reviewed, no issues  Last BM:  PTA  Height:   Ht Readings from Last 1 Encounters:  08/27/15 6' (1.829 m)    Weight:   Wt Readings from Last 1 Encounters:  08/27/15 284 lb 13.4 oz (129.2 kg)    Ideal Body Weight:  80.9 kg  BMI:  Body mass index is 38.62 kg/(m^2).  Estimated Nutritional Needs:   Kcal:  [id number]  Protein:  162 grams  Fluid:  >/= 1.4L  EDUCATION NEEDS:   No education needs identified at this time  [name], MS, RD LDN After Hours/Weekend Pager [phone]

## 2015-08-28 ENCOUNTER — Inpatient Hospital Stay (HOSPITAL_COMMUNITY): Payer: Medicare HMO

## 2015-08-28 DIAGNOSIS — I480 Paroxysmal atrial fibrillation: Secondary | ICD-10-CM

## 2015-08-28 DIAGNOSIS — J810 Acute pulmonary edema: Secondary | ICD-10-CM

## 2015-08-28 DIAGNOSIS — N200 Calculus of kidney: Secondary | ICD-10-CM

## 2015-08-28 DIAGNOSIS — J9601 Acute respiratory failure with hypoxia: Secondary | ICD-10-CM

## 2015-08-28 LAB — CBC WITH DIFFERENTIAL/PLATELET
BASOS ABS: 0 10*3/uL (ref 0.0–0.1)
BASOS PCT: 0 %
EOS ABS: 0 10*3/uL (ref 0.0–0.7)
EOS PCT: 0 %
HEMATOCRIT: 36.7 % — AB (ref 39.0–52.0)
Hemoglobin: 11.2 g/dL — ABNORMAL LOW (ref 13.0–17.0)
Lymphocytes Relative: 7 %
Lymphs Abs: 0.6 10*3/uL — ABNORMAL LOW (ref 0.7–4.0)
MCH: 28.5 pg (ref 26.0–34.0)
MCHC: 30.5 g/dL (ref 30.0–36.0)
MCV: 93.4 fL (ref 78.0–100.0)
MONO ABS: 1.1 10*3/uL — AB (ref 0.1–1.0)
Monocytes Relative: 12 %
NEUTROS ABS: 7.3 10*3/uL (ref 1.7–7.7)
Neutrophils Relative %: 81 %
PLATELETS: 141 10*3/uL — AB (ref 150–400)
RBC: 3.93 MIL/uL — ABNORMAL LOW (ref 4.22–5.81)
RDW: 14.6 % (ref 11.5–15.5)
WBC: 9.1 10*3/uL (ref 4.0–10.5)

## 2015-08-28 LAB — GLUCOSE, CAPILLARY
GLUCOSE-CAPILLARY: 133 mg/dL — AB (ref 65–99)
GLUCOSE-CAPILLARY: 127 mg/dL — AB (ref 65–99)
Glucose-Capillary: 117 mg/dL — ABNORMAL HIGH (ref 65–99)
Glucose-Capillary: 139 mg/dL — ABNORMAL HIGH (ref 65–99)
Glucose-Capillary: 138 mg/dL — ABNORMAL HIGH (ref 65–99)

## 2015-08-28 LAB — LACTIC ACID, PLASMA: Lactic Acid, Venous: 1.1 mmol/L (ref 0.5–2.0)

## 2015-08-28 LAB — MAGNESIUM: MAGNESIUM: 2.2 mg/dL (ref 1.7–2.4)

## 2015-08-28 LAB — URINE CULTURE: CULTURE: NO GROWTH

## 2015-08-28 LAB — HEPARIN LEVEL (UNFRACTIONATED)
Heparin Unfractionated: <0.1 IU/mL — ABNORMAL LOW (ref 0.30–0.70)
Heparin Unfractionated: 0.1 IU/mL — ABNORMAL LOW (ref 0.30–0.70)

## 2015-08-28 LAB — PHOSPHORUS: PHOSPHORUS: 1.8 mg/dL — AB (ref 2.5–4.6)

## 2015-08-28 LAB — TROPONIN I: TROPONIN I: 0.03 ng/mL (ref <0.031)

## 2015-08-28 MED ORDER — HEPARIN (PORCINE) IN NACL 100-0.45 UNIT/ML-% IJ SOLN
2500.0000 [IU]/h | INTRAMUSCULAR | Status: DC
  Administered 2015-08-29: 2500 [IU]/h via INTRAVENOUS
  Filled 2015-08-28 (×2): qty 250

## 2015-08-28 MED ORDER — FUROSEMIDE 10 MG/ML IJ SOLN
40.0000 mg | Freq: Once | INTRAMUSCULAR | Status: AC
Start: 2015-08-28 — End: 2015-08-28
  Administered 2015-08-28: 40 mg via INTRAVENOUS
  Filled 2015-08-28: qty 4

## 2015-08-28 MED ORDER — ARTIFICIAL TEARS OP OINT
1.0000 "application " | TOPICAL_OINTMENT | Freq: Three times a day (TID) | OPHTHALMIC | Status: DC
  Filled 2015-08-28: qty 3.5

## 2015-08-28 MED ORDER — HEPARIN BOLUS VIA INFUSION
2000.0000 [IU] | Freq: Once | INTRAVENOUS | Status: AC
  Administered 2015-08-28: 2000 [IU] via INTRAVENOUS
  Filled 2015-08-28: qty 2000

## 2015-08-28 MED ORDER — PROPOFOL 1000 MG/100ML IV EMUL: 5.0000 ug/kg/min | INTRAVENOUS | Status: DC

## 2015-08-28 MED ORDER — ACETAMINOPHEN 160 MG/5ML PO SOLN
650.0000 mg | Freq: Four times a day (QID) | ORAL | Status: DC | PRN
  Administered 2015-08-28: 650 mg
  Filled 2015-08-28: qty 20.3

## 2015-08-28 MED ORDER — HEPARIN (PORCINE) IN NACL 100-0.45 UNIT/ML-% IJ SOLN
2200.0000 [IU]/h | INTRAMUSCULAR | Status: DC
  Administered 2015-08-28: 2200 [IU]/h via INTRAVENOUS
  Filled 2015-08-28 (×3): qty 250

## 2015-08-28 MED ORDER — FENTANYL CITRATE (PF) 100 MCG/2ML IJ SOLN
25.0000 ug | INTRAMUSCULAR | Status: DC | PRN
  Administered 2015-08-28: 25 ug via INTRAVENOUS
  Filled 2015-08-28 (×2): qty 2

## 2015-08-28 MED ORDER — CISATRACURIUM BOLUS VIA INFUSION
0.0500 mg/kg | Freq: Once | INTRAVENOUS | Status: DC
  Filled 2015-08-28: qty 7

## 2015-08-28 MED ORDER — HEPARIN BOLUS VIA INFUSION
2000.0000 [IU] | Freq: Once | INTRAVENOUS | Status: AC
Start: 2015-08-28 — End: 2015-08-28
  Administered 2015-08-28: 2000 [IU] via INTRAVENOUS
  Filled 2015-08-28: qty 2000

## 2015-08-28 MED ORDER — CHLORHEXIDINE GLUCONATE 0.12 % MT SOLN
OROMUCOSAL | Status: AC
  Filled 2015-08-28: qty 15

## 2015-08-28 MED ORDER — FENTANYL CITRATE (PF) 100 MCG/2ML IJ SOLN: 100.0000 ug | Freq: Once | INTRAMUSCULAR | Status: DC

## 2015-08-28 MED ORDER — METOPROLOL TARTRATE 12.5 MG HALF TABLET
12.5000 mg | ORAL_TABLET | Freq: Two times a day (BID) | ORAL | Status: DC
  Filled 2015-08-28: qty 1

## 2015-08-28 MED ORDER — METOPROLOL TARTRATE 1 MG/ML IV SOLN
2.5000 mg | INTRAVENOUS | Status: DC | PRN
  Filled 2015-08-28: qty 5

## 2015-08-28 MED ORDER — FENTANYL CITRATE (PF) 100 MCG/2ML IJ SOLN: 100.0000 ug | Freq: Once | INTRAMUSCULAR | Status: DC | PRN

## 2015-08-28 MED ORDER — FENTANYL CITRATE (PF) 100 MCG/2ML IJ SOLN
25.0000 ug | INTRAMUSCULAR | Status: DC | PRN
  Administered 2015-08-28: 25 ug via INTRAVENOUS
  Filled 2015-08-28: qty 2

## 2015-08-28 MED ORDER — FENTANYL BOLUS VIA INFUSION: 50.0000 ug | INTRAVENOUS | Status: DC | PRN

## 2015-08-28 MED ORDER — SODIUM CHLORIDE 0.9 % IV SOLN
3.0000 ug/kg/min | INTRAVENOUS | Status: DC
  Filled 2015-08-28: qty 20

## 2015-08-28 MED ORDER — SODIUM CHLORIDE 0.9 % IV SOLN: 25.0000 ug/h | INTRAVENOUS | Status: DC

## 2015-08-28 MED ORDER — VECURONIUM BROMIDE 10 MG IV SOLR: 5.0000 mg | Freq: Once | INTRAVENOUS | Status: DC

## 2015-08-28 NOTE — Progress Notes (Signed)
eLink Physician-Brief Progress Note Patient Name: Kayren EavesBuford L Brum DOB: 03/21/1941 MRN: [id number]   Date of Service  08/28/2015  HPI/Events of Note  Patient c/o chronic back pain. Patient is NPO.   eICU Interventions  Will order Fentanyl 25 mcg IV Q 4 hours PRN pain.      Intervention Category Intermediate Interventions: Pain - evaluation and management  Trenna Kiely Eugene 08/28/2015, 9:20 PM

## 2015-08-28 NOTE — Progress Notes (Signed)
ANTICOAGULATION CONSULT NOTE - Follow Up Consult  Pharmacy Consult for heparin Indication: atrial fibrillation  Allergies  Allergen Reactions  . Gabapentin Other (See Comments)    "makes me loopy"  . Ibuprofen Rash    Hives     Patient Measurements: Height: 6' (182.9 cm) Weight: 284 lb 13.4 oz (129.2 kg) IBW/kg (Calculated) : 77.6  TBW: 129.2 kg Heparin Dosing Weight: 107 kg  Vital Signs: Temp: 98.9 F (37.2 C) (03/11 0400) Temp Source: Axillary (03/11 0400) BP: 151/79 mmHg (03/11 0816)  Labs:  Recent Labs  08/26/15 1800 08/26/15 1900 08/27/15 0051 08/27/15 0537 08/27/15 0941 08/27/15 1245 08/27/15 2125 08/28/15 0615  HGB 14.1  --   --  13.2 12.1*  --   --  11.2*  HCT 43.1  --   --  39.9 39.4  --   --  36.7*  PLT 132*  --   --  120* 132*  --   --  141*  APTT  --   --   --   --   --  29  --   --   LABPROT 14.3  --   --   --   --  14.7  --   --   INR 1.09  --   --   --   --  1.17  --   --   HEPARINUNFRC  --   --   --   --   --   --  <0.10* <0.10*  CREATININE 0.82  --   --  0.73 0.85 0.75  --   --   CKTOTAL  --  64  --   --   --   --   --   --   TROPONINI 0.04*  --  0.04* 0.03  --   --   --  0.03    Estimated Creatinine Clearance: 112.5 mL/min (by C-G formula based on Cr of 0.75).   Medical History: Past Medical History  Diagnosis Date  . Sleep apnea   . Hypertension   . Depression   . Arthritis   . Urinary frequency   . Lumbar neuralgia   . Headache(784.0)   . Constipation due to pain medication   . Hepatitis   . Hepatitis B     "years ago"     Assessment: [age] yoM with with a history of HTN, morbid obesity, and OSA not on CPAP who [facility] with left hip and flank pain. He was found to have a left ureteral stone. He was also noted to be in new onset atrial fib/flutter.  He was admitted for new onset afib, urosepsis, and for emergent ureteral stent placements.  Pharmacy consulted by cardiology to start heparin infusion without bolus  d/t urology procedure this morning.  Not on anticoagulants PTA.  Heparin level = < 0.1 mcg/ml with heparin @ 1800 units/hr. Remains subtherapeutic despite rate increase. Hgb/Plts stable. RN reports urine looks same as yesterday following procedure.  Remains blood tinged, has not worsened. SCr WNL/stable.  Goal of Therapy:  Heparin level 0.3-0.7 units/ml Monitor platelets by anticoagulation protocol: Yes   Plan:  Since 24 hours since urologic procedure, will provide a moderate bolus of 2000 units x 1 now. Increase IV heparin to 2200 units/hr. Check heparin level 8 hours after rate increase. Daily HL and CBC while on heparin infusion. F/u s/sxs of bleeding. Per Dr. Berneice HeinrichManny, should expect some hematuria.  Clance Bollunyon, Arcadia Gorgas, PharmD 08/28/2015,8:58 AM

## 2015-08-28 NOTE — Progress Notes (Signed)
Patient weaned well from about [phone] with very minimal sedation. Patient was awake and alert for urology, cardiology MD visits. Patient became very frustrated around 0945 not being able to communicate and obviously wanting his ETT out. Pt thrashing around in bed. Respiratory at bedside and placed patient back on full support d/t copious secretions and patient's distress. RN required to go back up on sedation at this time.

## 2015-08-28 NOTE — Progress Notes (Signed)
1 Day Post-Op Subjective: Patient is alert this morning. Responsive.  Objective: Vital signs in last 24 hours: Temp:  [98.5 F (36.9 C)-102 F (38.9 C)] 98.9 F (37.2 C) (03/11 0400) Pulse Rate:  [97] 97 (03/10 0921) Resp:  [18-31] 31 (03/11 0816) BP: (151)/(79) 151/79 mmHg (03/11 0816) SpO2:  [97 %-100 %] 100 % (03/11 0816) Arterial Line BP: (102-170)/(51-128) 149/71 mmHg (03/11 0816) FiO2 (%):  [40 %-50 %] 40 % (03/11 0800) Weight:  [129.2 kg (284 lb 13.4 oz)] 129.2 kg (284 lb 13.4 oz) (03/10 0921)  Intake/Output from previous day: 03/10 0701 - 03/11 0700 In: 5629.8 [I.V.:5029.8; IV Piggyback:600] Out: 2380 [Urine:1885; Emesis/NG output:490; Blood:5] Intake/Output this shift: Total I/O In: 48.6 [I.V.:48.6] Out: 200 [Urine:200]  Physical Exam:  Constitutional: Vital signs reviewed. WD WN in NAD   Eyes: PERRL, No scleral icterus.   Cardiovascular: RRR Pulmonary/Chest: Normal effort. On ventilator support.   Lab Results:  Recent Labs  08/27/15 0537 08/27/15 0941 08/28/15 0615  HGB 13.2 12.1* 11.2*  HCT 39.9 39.4 36.7*   BMET  Recent Labs  08/27/15 0941 08/27/15 1245  NA 137 135  K 3.8 3.9  CL 104 101  CO2 23 25  GLUCOSE 178* 179*  BUN 28* 29*  CREATININE 0.85 0.75  CALCIUM 8.2* 8.2*    Recent Labs  08/26/15 1800 08/27/15 1245  INR 1.09 1.17   No results for input(s): LABURIN in the last 72 hours. Results for orders placed or performed during the hospital encounter of 08/26/15  MRSA PCR Screening     Status: None   Collection Time: 08/27/15 10:11 AM  Result Value Ref Range Status   MRSA by PCR NEGATIVE NEGATIVE Final    Comment:        The GeneXpert MRSA Assay (FDA approved for NASAL specimens only), is one component of a comprehensive MRSA colonization surveillance program. It is not intended to diagnose MRSA infection nor to guide or monitor treatment for MRSA infections.    Urine and blood cultures pending. Studies/Results: Dg  Chest 1 View  08/26/2015  CLINICAL DATA:  Fall 2 days prior, with back and left hip pain. EXAM: CHEST 1 VIEW COMPARISON:  Radiograph 01/03/2012 FINDINGS: Lung volumes are low leading to accentuation of the cardiac size and crowding of bronchovascular structures. No confluent airspace disease, large pleural effusion or pneumothorax. No acute osseous abnormalities are seen. IMPRESSION: Hypoventilatory chest accentuating cardiac size and crowding of bronchovascular structures. Recommend PA and lateral views when patient is able. Electronically Signed   By: Rubye Oaks M.D.   On: 08/26/2015 19:27   Dg Lumbar Spine Complete  08/26/2015  CLINICAL DATA:  Fall tuesday evening EXAM: LUMBAR SPINE - COMPLETE 4+ VIEW COMPARISON:  08/10/05 FINDINGS: Prior L1/2 fusion with hardware. Normal alignment. No acute fracture. Diffuse osseous fusion lumbar spine IMPRESSION: No acute findings. Electronically Signed   By: Esperanza Heir M.D.   On: 08/26/2015 19:27   Dg Chest Port 1 View  08/28/2015  CLINICAL DATA:  Acute respiratory failure EXAM: PORTABLE CHEST 1 VIEW COMPARISON:  August 27, 2015 FINDINGS: The ETT is in stable position. An NG tube has been inserted, terminating below today's film. No pneumothorax. Mild opacity in the right base is likely atelectasis. Recommend attention on follow-up. Cardiomegaly is stable. The hila and mediastinum are unchanged. No overt edema. IMPRESSION: Mild increased opacity in the right base is likely atelectasis. Recommend attention on follow-up. No other acute interval changes. Electronically Signed   By: Onalee Hua  Judithe ModestWilliams III M.D   On: 08/28/2015 06:59   Dg Chest Port 1 View  08/27/2015  CLINICAL DATA:  Endotracheal tube placement. EXAM: PORTABLE CHEST - 1 VIEW COMPARISON:  One-view chest x-ray from the same day. FINDINGS: Of the patient has now been intubated. The endotracheal tube terminates 4.5 cm above the carina, in satisfactory position. Mild pulmonary vascular congestion is  stable. The lung volumes are low. Right apical opacity is unchanged. IMPRESSION: 1. Satisfactory positioning of the endotracheal tube. 2. Low lung volumes and mild pulmonary vascular congestion. Electronically Signed   By: Marin Robertshristopher  Mattern M.D.   On: 08/27/2015 10:39   Dg Chest Portable 1 View  08/27/2015  CLINICAL DATA:  Preop for cystoscopy. Left ureteral obstruction. Fever. EXAM: PORTABLE CHEST - 1 VIEW COMPARISON:  One-view chest x-ray 08/26/2015. FINDINGS: The heart size is exaggerated by low lung volumes. Mild interstitial coarsening is stable. Asymmetric opacification is again noted at the right lung apex. Recommend continued follow-up with PA and lateral chest radiographs prior to discharge. IMPRESSION: 1. Persistent low lung volumes. 2. Asymmetric right apical airspace opacification. Recommend follow-up PA and lateral chest radiographs as the patient's condition allows. Electronically Signed   By: Marin Robertshristopher  Mattern M.D.   On: 08/27/2015 07:19   Dg Abd Portable 1v  08/27/2015  CLINICAL DATA:  Gastric tube placement EXAM: PORTABLE ABDOMEN - 1 VIEW COMPARISON:  CT abdomen 08/26/2015 FINDINGS: Gastric tube enters the stomach with the tip near the pylorus. Mildly distended colon compatible with ileus. Bilateral ureteral stents. Left renal calculus. Lumbar scoliosis and multilevel fusion. IMPRESSION: Gastric tube at the level of the pylorus.  Mild colonic ileus Electronically Signed   By: Marlan Palauharles  Clark M.D.   On: 08/27/2015 11:44   Dg Hip Unilat With Pelvis 2-3 Views Left  08/26/2015  CLINICAL DATA:  Fall 2 days prior, persistent left hip pain. EXAM: DG HIP (WITH OR WITHOUT PELVIS) 2-3V LEFT COMPARISON:  None. FINDINGS: The cortical margins of the bony pelvis are intact. No fracture. Pubic symphysis and sacroiliac joints are congruent. Mild degenerative change of both hips. Both femoral heads are well-seated in the respective acetabula. IMPRESSION: No pelvic or left hip fracture. Electronically  Signed   By: Rubye OaksMelanie  Ehinger M.D.   On: 08/26/2015 19:25   Ct Cta Abd/pel W/cm &/or W/o Cm  08/26/2015  CLINICAL DATA:  Recent fall with extreme left-sided pain radiating to the back. Clinical concern for abdominal aortic aneurysm or dissection. EXAM: CTA ABDOMEN AND PELVIS wITHOUT AND WITH CONTRAST TECHNIQUE: Multidetector CT imaging of the abdomen and pelvis was performed using the standard protocol during bolus administration of intravenous contrast. Multiplanar reconstructed images and MIPs were obtained and reviewed to evaluate the vascular anatomy. CONTRAST:  100 cc Omnipaque 350 IV COMPARISON:  Lumbar spine and hip radiographs earlier this day. FINDINGS: Lower chest: Atelectasis in the dependent right greater than left lower lobe. Heart upper limits of normal in size. Coronary artery calcifications. No pleural effusion. Liver: No acute traumatic injury. Prominent size. No evidence of focal lesion allowing for noncontrast and arterial phase imaging. Hepatobiliary: Distended gallbladder containing intraluminal gallstone. No pericholecystic inflammation. Prominence of the common bile duct measures 10 mm at the porta hepatis. Pancreas: Mild fatty atrophy. No ductal dilatation or inflammation. No evidence of traumatic injury. Spleen: Normal for arterial phase imaging. No perisplenic fluid. Spleen is enlarged measuring 15.7 cm cranial caudal. Adrenal glands: No nodule or hemorrhage. Kidneys: Symmetric renal enhancement. No hydronephrosis. No significant perinephric stranding. Nonobstructing bilateral renal stones.  There are 2 adjacent 8 mm stones in the distal left ureter at the ureterovesicular junction, no ureteral dilatation. Bilateral renal cysts. There is cortical scarring in the left kidney. Stomach/Bowel: Stomach is decompressed. There are no dilated or thickened small bowel loops. Cecum is medially displaced in the mid abdomen with prominent stool. Moderate stool throughout the remainder the colon.  Distal colonic diverticulosis without diverticulitis. The appendix is not visualized. Vascular/Lymphatic: Normal caliber abdominal aorta without aneurysm or dissection. Mild tortuosity and atherosclerosis. No aortic hematoma, soft tissue stranding, or acute aortic abnormality. Celiac, superior and inferior mesenteric arteries are patent. Single bilateral renal arteries are patent. Tortuosity of both common and external iliac arteries without aneurysmal dilatation. No retroperitoneal fluid. No retroperitoneal adenopathy. Reproductive: Normal sized prostate gland. Bladder: Minimally distended, no wall thickening. Other: No free air, free fluid, or intra-abdominal fluid collection. Tiny fat containing umbilical hernia. Musculoskeletal: There are no acute or suspicious osseous abnormalities. Extensive multilevel postsurgical change in the spine. Posterior hardware at L1-2 with rod and intrapedicular screws, the pedicular screws abut the T12-L1 disc space. No evidence of acute fracture allowing for degree of background chronic change. No fracture of the bony pelvis or hips. Probable bone harvest in the right iliac bone. Review of the MIP images confirms the above findings. IMPRESSION: 1. Normal caliber abdominal aorta without acute aortic abnormality or aortic aneurysm. 2. No evidence of acute traumatic abnormality in the abdomen/pelvis. 3. There are 2 adjacent distal left ureteric stones both measuring 8 mm. No proximal ureteral dilatation or frank hydronephrosis, however these may be intermittently obstructing and cause of patient's left-sided pain. There are nonobstructing stones in both kidneys. Electronically Signed   By: Rubye Oaks M.D.   On: 08/26/2015 20:39    Assessment/Plan:   Status post bilateral stenting for obstructed, infected ureteral calculi. Obvious associated infection with sepsis. Patient is stable, improving. Cultures are pending.    We'll continue to follow-no other urologic  intervention necessary at this time, however.   LOS: 1 day   Chelsea Aus 08/28/2015, 8:38 AM

## 2015-08-28 NOTE — Progress Notes (Signed)
Patient had a bath today and was given new top sheets, pillow cases, and blanket. Patient refused to have his fitted sheet replaced because he didn't want to turn.

## 2015-08-28 NOTE — Progress Notes (Signed)
PROGRESS NOTE  Subjective:   75 y.o. male with a history of HTN, morbid obesity, and OSA not on CPAP who [facility] with left hip and flank pain. He was found to have a left ureteral stone. He was also noted to be in new onset atrial fib/flutter and cardiology consulted.   Currently he is in AF at 108. Awake and alert on the vent  Wants the ETT out   Objective:    Vital Signs:   Temp:  [98.5 F (36.9 C)-102 F (38.9 C)] 99.2 F (37.3 C) (03/11 0816) Resp:  [20-31] 31 (03/11 0816) BP: (151)/(79) 151/79 mmHg (03/11 0816) SpO2:  [97 %-100 %] 100 % (03/11 0816) Arterial Line BP: (102-161)/(51-128) 149/71 mmHg (03/11 0816) FiO2 (%):  [40 %] 40 % (03/11 0800)      24-hour weight change: Weight change:   Weight trends: Filed Weights   08/27/15 0921  Weight: 284 lb 13.4 oz (129.2 kg)    Intake/Output:  03/10 0701 - 03/11 0700 In: 5629.8 [I.V.:5029.8; IV Piggyback:600] Out: 2380 [Urine:1885; Emesis/NG output:490; Blood:5] Total I/O In: 48.6 [I.V.:48.6] Out: 350 [Urine:350]   Physical Exam: BP 151/79 mmHg  Pulse 97  Temp(Src) 99.2 F (37.3 C) (Oral)  Resp 31  Ht 6' (1.829 m)  Wt 284 lb 13.4 oz (129.2 kg)  BMI 38.62 kg/m2  SpO2 100%  Wt Readings from Last 3 Encounters:  08/27/15 284 lb 13.4 oz (129.2 kg)  01/03/12 214 lb 4.6 oz (97.2 kg)    General: Vital signs reviewed and noted.   Head: Normocephalic, atraumatic.  intubated  Eyes: conjunctivae/corneas clear.  EOM's intact.   Throat: normal  Neck:  normal   Lungs:    on the vent   Heart:  irreg. irreg   Abdomen:  Soft, non-tender, non-distended    Extremities: No c/c/e   Neurologic: A&O X3, CN II - XII are grossly intact.   Psych: Normal     Labs: BMET:  Recent Labs  08/27/15 0051  08/27/15 0941 08/27/15 1245 08/28/15 0615  NA  --   < > 137 135  --   K  --   < > 3.8 3.9  --   CL  --   < > 104 101  --   CO2  --   < > 23 25  --   GLUCOSE  --   < > 178* 179*  --   BUN   --   < > 28* 29*  --   CREATININE  --   < > 0.85 0.75  --   CALCIUM  --   < > 8.2* 8.2*  --   MG 1.9  --   --   --  2.2  PHOS 3.0  --   --   --  1.8*  < > = values in this interval not displayed.  Liver function tests:  Recent Labs  08/27/15 0537 08/27/15 0941  AST 18 27  ALT 15* 17  ALKPHOS 73 68  BILITOT 0.9 1.3*  PROT 6.6 6.1*  ALBUMIN 3.0* 3.0*   No results for input(s): LIPASE, AMYLASE in the last 72 hours.  CBC:  Recent Labs  08/27/15 0941 08/28/15 0615  WBC 16.6* 9.1  NEUTROABS 15.0* 7.3  HGB 12.1* 11.2*  HCT 39.4 36.7*  MCV 92.5 93.4  PLT 132* 141*    Cardiac Enzymes:  Recent Labs  08/26/15 1800 08/26/15 1900 08/27/15 0051 08/27/15 0537 08/28/15 [id number]  CKTOTAL  --  64  --   --   --   TROPONINI 0.04*  --  0.04* 0.03 0.03    Coagulation Studies:  Recent Labs  08/26/15 1800 08/27/15 1245  LABPROT 14.3 14.7  INR 1.09 1.17    Other: Invalid input(s): POCBNP No results for input(s): DDIMER in the last 72 hours. No results for input(s): HGBA1C in the last 72 hours. No results for input(s): CHOL, HDL, LDLCALC, TRIG, CHOLHDL in the last 72 hours. No results for input(s): TSH, T4TOTAL, T3FREE, THYROIDAB in the last 72 hours.  Invalid input(s): FREET3 No results for input(s): VITAMINB12, FOLATE, FERRITIN, TIBC, IRON, RETICCTPCT in the last 72 hours.   Other results:  Tele  ( personally reviewed ) A-fib with rate of 108  Medications:    Infusions: . sodium chloride 100 mL/hr at 08/27/15 1744  . fentaNYL infusion INTRAVENOUS 50 mcg/hr (08/28/15 0945)  . heparin    . phenylephrine (NEO-SYNEPHRINE) Adult infusion Stopped (08/27/15 9604)    Scheduled Medications: . antiseptic oral rinse  7 mL Mouth Rinse QID  . chlorhexidine gluconate  15 mL Mouth Rinse BID  . cholecalciferol  2,000 Units Oral Daily  . DULoxetine  90 mg Oral Daily  . heparin  2,000 Units Intravenous Once  . insulin aspart  2-6 Units Subcutaneous 6 times per day  .  piperacillin-tazobactam (ZOSYN)  IV  3.375 g Intravenous Q8H  . potassium chloride  20 mEq Oral BID  . sodium chloride flush  3 mL Intravenous Q12H  . tamsulosin  0.4 mg Oral QHS  . vancomycin  1,250 mg Intravenous Q12H    Assessment/ Plan:   Principal Problem:   Urolithiasis Active Problems:   Lumbar pseudoarthrosis   Left nephrolithiasis   Chronic back pain   Hypertension   OSA (obstructive sleep apnea)   Depression   Elevated troponin level   New onset atrial fibrillation (HCC)   Sepsis (HCC)   Severe sepsis (HCC)   Acute respiratory failure with hypoxia (HCC)   Acute respiratory failure (HCC)   Atrial fibrillation (HCC)  1. Atrial fib:   No hx of previous Afib May be due to the renal stone and related complications / respiratory failure Continue conservative therapy for now CHADS2VASC is at least 2 ( HTN, AGE)   Agree with anticoagulation . Heparin for now.   Eventually would start a DOAC   2. Troponin - flat curve, not likely related to ACS  3. HTN  - slightly elevated,   He is very aggitated - wide awake and still on the vent. Continue medical therapy    Disposition:  Length of Stay: 1  Vesta Mixer, Montez Hageman., MD, [facility] 08/28/2015, 9:52 AM Office [phone] Pager [phone]

## 2015-08-28 NOTE — Progress Notes (Signed)
eLink Physician-Brief Progress Note Patient Name: Travis EavesBuford L Vandyken DOB: 03/27/1941 MRN: [id number]   Date of Service  08/28/2015  HPI/Events of Note  Multiple issues: 1. Advance diet? 2. AFIB with Ventricular rate = 110's to 140's. 3. D/C A-line? 4. Temp = 101.8 F. Patient is already on Vancomycin and Zosyn.   eICU Interventions  Will order: 1. Clear liquid diet - Carb modified.  2. Metoprolol 12.5 mg PO BID.  3. D/C A-line. 4. Blood cultures X 2.  5. Tylenol liquid 650 mg PO Q 6 hours PRN Temp > 101.5 F.     Intervention Category Major Interventions: Infection - evaluation and management;Arrhythmia - evaluation and management Minor Interventions: Routine modifications to care plan (e.g. PRN medications for pain, fever)  Brylinn Teaney Eugene 08/28/2015, 5:38 PM

## 2015-08-28 NOTE — Consult Note (Signed)
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 He had a fall on 08/24/2015 and after that had felt low back and flank pain. He presented to the emergency room because of this pain on 08/26/2015. He was found to be in atrial fibrillation rapid ventricular rate and admitted to the hospitalist service but overnight became febrile with SIRS, abnormal urinalysis with bacteria and evaluation revealed stones in his distal ureter distal to the utero-vesical junction with some amount of left hydronephrosis. He also had nonobstructing renal calculi on the right side. On 08/27/2015 he underwent cystoscopy with retrograde pyelogram and ureteroscopy and stent placement on the left side. He he was transferred to the ICU with postoperative acute respiratory failure on the ventilator and requiring neosynephrine via peripheral vein. Critical care medicine now assuming primary service 08/27/2015  CULTURES: MRSA PCR 08/26/2015 - neg MSSA PRCR 08/26/2015 - POS Blood culture 3/10 Urine culture 3/10   ANTIBIOTICS: rocephin 3/10 >>3/10 vanc 3/10 > Zosyn 3/10 >> levaquin 3/10 >>3/10  LINES/TUBES: ETT 3/10  SIGNIFICANT EVENTS: 08/26/2015 - admit 08/27/15 - OR  VITAL SIGNS: BP 151/79 mmHg  Pulse 97  Temp(Src) 99.2 F (37.3 C) (Oral)  Resp 31  Ht 6' (1.829 m)  Wt 129.2 kg (284 lb 13.4 oz)  BMI 38.62 kg/m2  SpO2 100%  HEMODYNAMICS:    VENTILATOR SETTINGS: Vent Mode:  [-] PSV FiO2 (%):  [40 %] 40 % Set Rate:  [16 bmp] 16 bmp Vt Set:  [620 mL] 620  mL PEEP:  [5 cmH20] 5 cmH20 Pressure Support:  [8 cmH20] 8 cmH20 Plateau Pressure:  [19 cmH20-32 cmH20] 19 cmH20  INTAKE / OUTPUT: I/O last 3 completed shifts: In: 6629.8 [I.V.:6029.8; IV Piggyback:600] Out: 2530 [Urine:2035; Emesis/NG output:490; Blood:5]  PHYSICAL EXAMINATION: General:  Obese male intubated looks critically ill Neuro:  Sedated but withdraws ext to pain HEENT:  ET tube present Cardiovascular:  Normal heart sounds. Irregularly irregular Lungs:  Clear to auscultation bilaterally. Synchronous with the ventilator Abdomen:  Obese. Soft nontender. No organomegaly Musculoskeletal:  No cyanosis. No clubbing no edema Skin:  Intact  LABS:  PULMONARY  Recent Labs Lab 08/27/15 1016  PHART 7.349*  PCO2ART 46.9*  PO2ART 106*  HCO3 24.5*  TCO2 22.4  O2SAT 97.2   CBC  Recent Labs Lab 08/27/15 0537 08/27/15 0941 08/28/15 0615  HGB 13.2 12.1* 11.2*  HCT 39.9 39.4 36.7*  WBC 10.2 16.6* 9.1  PLT 120* 132* 141*   COAGULATION  Recent Labs Lab 08/26/15 1800 08/27/15 1245  INR 1.09 1.17   CARDIAC   Recent Labs Lab 08/26/15 1800 08/27/15 0051 08/27/15 0537 08/28/15 0615  TROPONINI 0.04* 0.04* 0.03 0.03   No results for input(s): PROBNP in the last 168 hours.  CHEMISTRY  Recent Labs Lab 08/26/15 1800 08/27/15 0051 08/27/15 0537 08/27/15 0941 08/27/15 1245 08/28/15 0615  NA 137  --  136 137 135  --   K 3.7  --  3.8 3.8 3.9  --   CL 99*  --  100* 104 101  --   CO2 25  --  24 23 25   --   GLUCOSE 161*  --  178* 178* 179*  --   BUN 22*  --  22* 28* 29*  --   CREATININE 0.82  --  0.73 0.85 0.75  --   CALCIUM 9.5  --  8.7* 8.2* 8.2*  --   MG  --  1.9  --   --   --  2.2  PHOS  --  3.0  --   --   --  1.8*   Estimated Creatinine Clearance: 112.5 mL/min (by C-G formula based on Cr of 0.75).  LIVER  Recent Labs Lab 08/26/15 1800 08/27/15 0537 08/27/15 0941 08/27/15 1245  AST 21 18 27   --   ALT 14* 15* 17  --   ALKPHOS 80 73 68  --    BILITOT 1.0 0.9 1.3*  --   PROT 6.7 6.6 6.1*  --   ALBUMIN 3.4* 3.0* 3.0*  --   INR 1.09  --   --  1.17   INFECTIOUS  Recent Labs Lab 08/27/15 0941 08/27/15 1245 08/28/15 0615  LATICACIDVEN 2.4* 1.8 1.1   ENDOCRINE CBG (last 3)   Recent Labs  08/27/15 1953 08/28/15 0034 08/28/15 0439  GLUCAP 133* 117* 139*   IMAGING x48h  - image(s) personally visualized  -   highlighted in bold Dg Chest 1 View  08/26/2015  CLINICAL DATA:  Fall 2 days prior, with back and left hip pain. EXAM: CHEST 1 VIEW COMPARISON:  Radiograph 01/03/2012 FINDINGS: Lung volumes are low leading to accentuation of the cardiac size and crowding of bronchovascular structures. No confluent airspace disease, large pleural effusion or pneumothorax. No acute osseous abnormalities are seen. IMPRESSION: Hypoventilatory chest accentuating cardiac size and crowding of bronchovascular structures. Recommend PA and lateral views when patient is able. Electronically Signed   By: Rubye OaksMelanie  Ehinger M.D.   On: 08/26/2015 19:27   Dg Lumbar Spine Complete  08/26/2015  CLINICAL DATA:  Fall tuesday evening EXAM: LUMBAR SPINE - COMPLETE 4+ VIEW COMPARISON:  08/10/05 FINDINGS: Prior L1/2 fusion with hardware. Normal alignment. No acute fracture. Diffuse osseous fusion lumbar spine IMPRESSION: No acute findings. Electronically Signed   By: Esperanza Heiraymond  Rubner M.D.   On: 08/26/2015 19:27   Dg Chest Port 1 View  08/28/2015  CLINICAL DATA:  Acute respiratory failure EXAM: PORTABLE CHEST 1 VIEW COMPARISON:  August 27, 2015 FINDINGS: The ETT is in stable position. An NG tube has been inserted, terminating below today's film. No pneumothorax. Mild opacity in the right base is likely atelectasis. Recommend attention on follow-up. Cardiomegaly is stable. The hila and mediastinum are unchanged. No overt edema. IMPRESSION: Mild increased opacity in the right base is likely atelectasis. Recommend attention on follow-up. No other acute interval changes.  Electronically Signed   By: Gerome Samavid  Williams III M.D   On: 08/28/2015 06:59   Dg Chest Port 1 View  08/27/2015  CLINICAL DATA:  Endotracheal tube placement. EXAM: PORTABLE CHEST - 1 VIEW COMPARISON:  One-view chest x-ray from the same day. FINDINGS: Of the patient has now been intubated. The endotracheal tube terminates 4.5 cm above the carina, in satisfactory position. Mild pulmonary vascular congestion is stable. The lung volumes are low. Right apical opacity is unchanged. IMPRESSION: 1. Satisfactory positioning of the endotracheal tube. 2. Low lung volumes and mild pulmonary vascular congestion. Electronically Signed   By: Marin Robertshristopher  Mattern M.D.   On: 08/27/2015 10:39   Dg Chest Portable 1 View  08/27/2015  CLINICAL DATA:  Preop for cystoscopy. Left ureteral obstruction. Fever. EXAM: PORTABLE CHEST - 1 VIEW COMPARISON:  One-view chest x-ray 08/26/2015. FINDINGS: The heart size is exaggerated by low lung volumes. Mild interstitial coarsening is stable. Asymmetric opacification is again noted at the right lung apex. Recommend continued follow-up with PA and lateral chest radiographs prior to discharge. IMPRESSION: 1. Persistent low lung volumes. 2. Asymmetric right apical airspace opacification. Recommend follow-up PA and lateral chest radiographs as the patient's condition allows. Electronically Signed   By: Marin Roberts M.D.   On: 08/27/2015 07:19   Dg Abd Portable 1v  08/27/2015  CLINICAL DATA:  Gastric tube placement EXAM: PORTABLE ABDOMEN - 1 VIEW COMPARISON:  CT abdomen 08/26/2015 FINDINGS: Gastric tube enters the stomach with the tip near the pylorus. Mildly distended colon compatible with ileus. Bilateral ureteral stents. Left renal calculus. Lumbar scoliosis and multilevel fusion. IMPRESSION: Gastric tube at the level of the pylorus.  Mild colonic ileus Electronically Signed   By: Marlan Palau M.D.   On: 08/27/2015 11:44   Dg Hip Unilat With Pelvis 2-3 Views Left  08/26/2015   CLINICAL DATA:  Fall 2 days prior, persistent left hip pain. EXAM: DG HIP (WITH OR WITHOUT PELVIS) 2-3V LEFT COMPARISON:  None. FINDINGS: The cortical margins of the bony pelvis are intact. No fracture. Pubic symphysis and sacroiliac joints are congruent. Mild degenerative change of both hips. Both femoral heads are well-seated in the respective acetabula. IMPRESSION: No pelvic or left hip fracture. Electronically Signed   By: Rubye Oaks M.D.   On: 08/26/2015 19:25   Ct Cta Abd/pel W/cm &/or W/o Cm  08/26/2015  CLINICAL DATA:  Recent fall with extreme left-sided pain radiating to the back. Clinical concern for abdominal aortic aneurysm or dissection. EXAM: CTA ABDOMEN AND PELVIS wITHOUT AND WITH CONTRAST TECHNIQUE: Multidetector CT imaging of the abdomen and pelvis was performed using the standard protocol during bolus administration of intravenous contrast. Multiplanar reconstructed images and MIPs were obtained and reviewed to evaluate the vascular anatomy. CONTRAST:  100 cc Omnipaque 350 IV COMPARISON:  Lumbar spine and hip radiographs earlier this day. FINDINGS: Lower chest: Atelectasis in the dependent right greater than left lower lobe. Heart upper limits of normal in size. Coronary artery calcifications. No pleural effusion. Liver: No acute traumatic injury. Prominent size. No evidence of focal lesion allowing for noncontrast and arterial phase imaging. Hepatobiliary: Distended gallbladder containing intraluminal gallstone. No pericholecystic inflammation. Prominence of the common bile duct measures 10 mm at the porta hepatis. Pancreas: Mild fatty atrophy. No ductal dilatation or inflammation. No evidence of traumatic injury. Spleen: Normal for arterial phase imaging. No perisplenic fluid. Spleen is enlarged measuring 15.7 cm cranial caudal. Adrenal glands: No nodule or hemorrhage. Kidneys: Symmetric renal enhancement. No hydronephrosis. No significant perinephric stranding. Nonobstructing bilateral  renal stones. There are 2 adjacent 8 mm stones in the distal left ureter at the ureterovesicular junction, no ureteral dilatation. Bilateral renal cysts. There is cortical scarring in the left kidney. Stomach/Bowel: Stomach is decompressed. There are no dilated or thickened small bowel loops. Cecum is medially displaced in the mid abdomen with prominent stool. Moderate stool throughout the remainder the colon. Distal colonic diverticulosis without diverticulitis. The appendix is not visualized. Vascular/Lymphatic: Normal caliber abdominal aorta without aneurysm or dissection. Mild tortuosity and atherosclerosis. No aortic hematoma, soft tissue stranding, or acute aortic abnormality. Celiac, superior and inferior mesenteric arteries are patent. Single bilateral renal arteries are patent. Tortuosity of both common and external iliac arteries without aneurysmal dilatation.  No retroperitoneal fluid. No retroperitoneal adenopathy. Reproductive: Normal sized prostate gland. Bladder: Minimally distended, no wall thickening. Other: No free air, free fluid, or intra-abdominal fluid collection. Tiny fat containing umbilical hernia. Musculoskeletal: There are no acute or suspicious osseous abnormalities. Extensive multilevel postsurgical change in the spine. Posterior hardware at L1-2 with rod and intrapedicular screws, the pedicular screws abut the T12-L1 disc space. No evidence of acute fracture allowing for degree of background chronic change. No fracture of the bony pelvis or hips. Probable bone harvest in the right iliac bone. Review of the MIP images confirms the above findings. IMPRESSION: 1. Normal caliber abdominal aorta without acute aortic abnormality or aortic aneurysm. 2. No evidence of acute traumatic abnormality in the abdomen/pelvis. 3. There are 2 adjacent distal left ureteric stones both measuring 8 mm. No proximal ureteral dilatation or frank hydronephrosis, however these may be intermittently obstructing  and cause of patient's left-sided pain. There are nonobstructing stones in both kidneys. Electronically Signed   By: Rubye Oaks M.D.   On: 08/26/2015 20:39   DISCUSSION: Urosepsis with postoperative respiratory failure  ASSESSMENT / PLAN:  PULMONARY A: #Background - Underlying history of OSA not otherwise specified. Details not known. Apparently does not use CPAP - Unclear history of smoking  #Current Acute postoperative respiratory failure P:   SBT to extubate today. As needed sedation ABG and CXR while intubated. Adjust vent for ABG. Ambulate once extubated. PT evaluation. IS per RT protocol.  CARDIOVASCULAR A:  Presented with atrial fibrillation. Presumed to be new. Details not known. Currently coming off Neo-Synephrine for perioperative circular to shock  P:  KVO IVF Await echo per cards. Titrate Neo-Synephrine for mean arterial pressure goal greater than 65 Dc orders for Cardizem and Lopressor Appreciate cardiology consultation  RENAL A:   - Bilateral renal calculi with left sided obstruction and left hydronephrosis but with normal creatinine at presentation - Status post stent placement  P:   Per urology Monitor electrolytes Replace electrolytes as needed. KVO IVF. Lasix 40 mg IV x1.  GASTROINTESTINAL A:   - History of hepatitis B not otherwise specified  P:   NG tube Consult nutrition for TF. Stress ulcer prophylaxis  HEMATOLOGIC A:   At risk for anemia of critical illness P:  DVT prophylaxis Monitor for bleeding Packed red blood cell for hemoglobin less than 7 g percent for anemia of critical illness  INFECTIOUS A:   UTI sepsis P:   Broad antibiotics as above  ENDOCRINE A:   At risk for hyperglycemia   P:   IC hyperglycemia protocol  NEUROLOGIC A:   #Baseline -History of depression on Cymbalta - hx of chronic pain - nos  #Current - Flank pain due to ureteral calculii  P:   Continue Cymbalta for now - monitor QTC D/C  sedation. RASS goal: 0 to -2  FAMILY  - Updates: No family at bedside but patient updated.  - Inter-disciplinary family meet or Palliative Care meeting due by:  09/03/2015 which is day 7  The patient is critically ill with multiple organ systems failure and requires high complexity decision making for assessment and support, frequent evaluation and titration of therapies, application of advanced monitoring technologies and extensive interpretation of multiple databases.   Critical Care Time devoted to patient care services described in this note is  35  Minutes. This time reflects time of care of this signee Dr Koren Bound. This critical care time does not reflect procedure time, or teaching time or supervisory time of  PA/NP/Med student/Med Resident etc but could involve care discussion time.  Alyson Reedy, M.D. [facility] Pulmonary/Critical Care Medicine. Pager: [phone]. After hours pager: [phone].  08/28/2015 10:18 AM

## 2015-08-28 NOTE — Progress Notes (Signed)
PHARMACIST - PHYSICIAN COMMUNICATION CONCERNING:  IV heparin  Please see note written earlier today by [name], PharmD for full details.  In brief, pt on IV heparin for new onset Afib.    Heparin level remains subtherapeutic.  Urine remains bloody, no change per RN. No clots noted.  No other bleeding or complications with infusion.    RECOMMENDATION: Increase heparin infusion to 2500 units/hr = 25 ml/hr Give heparin bolus 2000 units IV x1.  F/u heparin level in 8 hours  [name], PharmD, BCPS 08/28/2015, 8:01 PM  Pager: [phone]

## 2015-08-28 NOTE — Progress Notes (Signed)
60ml of fentanyl wasted in sink. Witnessed by Volanda NapoleonStephanie Dillon, RN.

## 2015-08-28 NOTE — Progress Notes (Signed)
eLink Physician-Brief Progress Note Patient Name: [name] DOB: 09-03-1940 MRN: [id number]   Date of Service  08/28/2015  HPI/Events of Note  Patient is coughing with clear liquids.   eICU Interventions  Will order: 1. NPO. 2. Speech swallow evaluation in the AM. 3. Change PO Metoprolol to IV Metoprolol.      Intervention Category Intermediate Interventions: Other:  Lenell AntuSommer,Keyaan Lederman Eugene 08/28/2015, 8:41 PM

## 2015-08-28 NOTE — Progress Notes (Signed)
E-Link MD, Dr. Dellie CatholicSommers notified of temperature of 101.8 and increasing heart rate throughout the day. Orders received at 1730.

## 2015-08-29 DIAGNOSIS — I4891 Unspecified atrial fibrillation: Secondary | ICD-10-CM

## 2015-08-29 LAB — CBC WITH DIFFERENTIAL/PLATELET
Basophils Absolute: 0 10*3/uL (ref 0.0–0.1)
Basophils Relative: 0 %
Eosinophils Absolute: 0 10*3/uL (ref 0.0–0.7)
Eosinophils Relative: 0 %
HEMATOCRIT: 36.5 % — AB (ref 39.0–52.0)
Hemoglobin: 11.7 g/dL — ABNORMAL LOW (ref 13.0–17.0)
LYMPHS PCT: 9 %
Lymphs Abs: 1 10*3/uL (ref 0.7–4.0)
MCH: 28.3 pg (ref 26.0–34.0)
MCHC: 32.1 g/dL (ref 30.0–36.0)
MCV: 88.4 fL (ref 78.0–100.0)
MONO ABS: 0.9 10*3/uL (ref 0.1–1.0)
MONOS PCT: 8 %
NEUTROS ABS: 8.8 10*3/uL — AB (ref 1.7–7.7)
Neutrophils Relative %: 83 %
Platelets: 173 10*3/uL (ref 150–400)
RBC: 4.13 MIL/uL — ABNORMAL LOW (ref 4.22–5.81)
RDW: 14.1 % (ref 11.5–15.5)
WBC: 10.6 10*3/uL — ABNORMAL HIGH (ref 4.0–10.5)

## 2015-08-29 LAB — BASIC METABOLIC PANEL
Anion gap: 10 (ref 5–15)
BUN: 32 mg/dL — AB (ref 6–20)
CHLORIDE: 103 mmol/L (ref 101–111)
CO2: 28 mmol/L (ref 22–32)
Calcium: 8.7 mg/dL — ABNORMAL LOW (ref 8.9–10.3)
Creatinine, Ser: 0.7 mg/dL (ref 0.61–1.24)
GFR calc Af Amer: >60 mL/min (ref >60)
GFR calc non Af Amer: >60 mL/min (ref >60)
GLUCOSE: 134 mg/dL — AB (ref 65–99)
POTASSIUM: 3.3 mmol/L — AB (ref 3.5–5.1)
Sodium: 141 mmol/L (ref 135–145)

## 2015-08-29 LAB — HEPARIN LEVEL (UNFRACTIONATED)
Heparin Unfractionated: 0.18 IU/mL — ABNORMAL LOW (ref 0.30–0.70)
Heparin Unfractionated: <0.1 IU/mL — ABNORMAL LOW (ref 0.30–0.70)

## 2015-08-29 LAB — GLUCOSE, CAPILLARY
GLUCOSE-CAPILLARY: 109 mg/dL — AB (ref 65–99)
GLUCOSE-CAPILLARY: 125 mg/dL — AB (ref 65–99)
GLUCOSE-CAPILLARY: 126 mg/dL — AB (ref 65–99)
GLUCOSE-CAPILLARY: 130 mg/dL — AB (ref 65–99)
GLUCOSE-CAPILLARY: 158 mg/dL — AB (ref 65–99)
GLUCOSE-CAPILLARY: 99 mg/dL (ref 65–99)
Glucose-Capillary: 106 mg/dL — ABNORMAL HIGH (ref 65–99)
Glucose-Capillary: 114 mg/dL — ABNORMAL HIGH (ref 65–99)
Glucose-Capillary: 120 mg/dL — ABNORMAL HIGH (ref 65–99)

## 2015-08-29 LAB — PHOSPHORUS: Phosphorus: 1.9 mg/dL — ABNORMAL LOW (ref 2.5–4.6)

## 2015-08-29 LAB — VANCOMYCIN, TROUGH: Vancomycin Tr: 7 ug/mL — ABNORMAL LOW (ref 10.0–20.0)

## 2015-08-29 LAB — MAGNESIUM: Magnesium: 2.3 mg/dL (ref 1.7–2.4)

## 2015-08-29 MED ORDER — HYDROMORPHONE HCL 1 MG/ML IJ SOLN: 0.2500 mg | INTRAMUSCULAR | Status: DC | PRN

## 2015-08-29 MED ORDER — FENTANYL CITRATE (PF) 100 MCG/2ML IJ SOLN
50.0000 ug | INTRAMUSCULAR | Status: DC | PRN
  Administered 2015-08-29 – 2015-08-30 (×7): 50 ug via INTRAVENOUS
  Filled 2015-08-29 (×7): qty 2

## 2015-08-29 MED ORDER — FENTANYL CITRATE (PF) 100 MCG/2ML IJ SOLN: 25.0000 ug | INTRAMUSCULAR | Status: DC | PRN

## 2015-08-29 MED ORDER — MEPERIDINE HCL 25 MG/ML IJ SOLN: 6.2500 mg | INTRAMUSCULAR | Status: DC | PRN

## 2015-08-29 MED ORDER — VANCOMYCIN HCL 10 G IV SOLR
1500.0000 mg | Freq: Two times a day (BID) | INTRAVENOUS | Status: DC
  Administered 2015-08-29 – 2015-08-30 (×2): 1500 mg via INTRAVENOUS
  Filled 2015-08-29 (×3): qty 1500

## 2015-08-29 MED ORDER — HEPARIN (PORCINE) IN NACL 100-0.45 UNIT/ML-% IJ SOLN
3300.0000 [IU]/h | INTRAMUSCULAR | Status: DC
  Administered 2015-08-29 – 2015-08-30 (×3): 3300 [IU]/h via INTRAVENOUS
  Filled 2015-08-29 (×2): qty 250

## 2015-08-29 MED ORDER — OXYCODONE-ACETAMINOPHEN 5-325 MG PO TABS
1.0000 | ORAL_TABLET | ORAL | Status: DC | PRN
  Administered 2015-08-29 – 2015-08-31 (×9): 1 via ORAL
  Filled 2015-08-29 (×9): qty 1

## 2015-08-29 MED ORDER — ONDANSETRON HCL 4 MG/2ML IJ SOLN: 4.0000 mg | Freq: Once | INTRAMUSCULAR | Status: DC | PRN

## 2015-08-29 MED ORDER — RESOURCE THICKENUP CLEAR PO POWD
ORAL | Status: DC | PRN
  Filled 2015-08-29: qty 125

## 2015-08-29 MED ORDER — PROMETHAZINE HCL 25 MG/ML IJ SOLN: 6.2500 mg | INTRAMUSCULAR | Status: DC | PRN

## 2015-08-29 MED ORDER — HEPARIN (PORCINE) IN NACL 100-0.45 UNIT/ML-% IJ SOLN
2800.0000 [IU]/h | INTRAMUSCULAR | Status: DC
  Administered 2015-08-29: 2800 [IU]/h via INTRAVENOUS
  Filled 2015-08-29 (×3): qty 250

## 2015-08-29 NOTE — Progress Notes (Signed)
Pharmacy Antibiotic Note  [name] is a 75 y.o. male admitted on 08/26/2015 with presumed urosepsis.  Pharmacy has been consulted for Vancomycin and Zosyn dosing.  Pt found to have left ureteral stone, right renal stones and is now s/p cystoscopy and b/l stent placement 3/10.    Plan: Check vancomycin trough tonight. Would consider discontinuing vancomycin since blood cultures no growth x 2 days, urine culture negative, and MRSA screen neg. Continue Zosyn 3.375g IV q8h (4 hour infusion time).   Height: 6' (182.9 cm) Weight: 284 lb 13.4 oz (129.2 kg) IBW/kg (Calculated) : 77.6  Temp (24hrs), Avg:99.8 F (37.7 C), Min:98 F (36.7 C), Max:102.1 F (38.9 C)   Recent Labs Lab 08/26/15 1800 08/27/15 0537 08/27/15 0941 08/27/15 1245 08/28/15 0615 08/29/15 0426  WBC 11.8* 10.2 16.6*  --  9.1 10.6*  CREATININE 0.82 0.73 0.85 0.75  --  0.70  LATICACIDVEN  --   --  2.4* 1.8 1.1  --     Estimated Creatinine Clearance: 112.5 mL/min (by C-G formula based on Cr of 0.7).    Allergies  Allergen Reactions  . Gabapentin Other (See Comments)    "makes me loopy"  . Ibuprofen Rash    Hives     Antimicrobials this admission: 3/10  Vancomycin >>  3/10 Zosyn >>   Dose adjustments this admission: -  Microbiology results: 3/10 BCx: ngtd 3/10 UCx: NGF  3/10 MRSA PCR: NEG 3/11 BCx: sent  Thank you for allowing pharmacy to be a part of this patient's care.  Clance BollRunyon, Seth Friedlander 08/29/2015 2:52 PM

## 2015-08-29 NOTE — Progress Notes (Signed)
ANTICOAGULATION CONSULT NOTE - Follow Up Consult  Pharmacy Consult for heparin Indication: atrial fibrillation  Allergies  Allergen Reactions  . Gabapentin Other (See Comments)    "makes me loopy"  . Ibuprofen Rash    Hives     Patient Measurements: Height: 6' (182.9 cm) Weight: 284 lb 13.4 oz (129.2 kg) IBW/kg (Calculated) : 77.6  Heparin Dosing Weight: 107 kg  Vital Signs: Temp: 98.2 F (36.8 C) (03/12 1416) Temp Source: Oral (03/12 1416) BP: 159/84 mmHg (03/12 1416) Pulse Rate: 107 (03/12 1416)  Labs:  Recent Labs  08/26/15 1800 08/26/15 1900 08/27/15 0051 08/27/15 0537 08/27/15 0941 08/27/15 1245  08/28/15 0615 08/28/15 1822 08/29/15 0425 08/29/15 0426 08/29/15 1448  HGB 14.1  --   --  13.2 12.1*  --   --  11.2*  --   --  11.7*  --   HCT 43.1  --   --  39.9 39.4  --   --  36.7*  --   --  36.5*  --   PLT 132*  --   --  120* 132*  --   --  141*  --   --  173  --   APTT  --   --   --   --   --  29  --   --   --   --   --   --   LABPROT 14.3  --   --   --   --  14.7  --   --   --   --   --   --   INR 1.09  --   --   --   --  1.17  --   --   --   --   --   --   HEPARINUNFRC  --   --   --   --   --   --   < > <0.10* 0.10* 0.18*  --  <0.10*  CREATININE 0.82  --   --  0.73 0.85 0.75  --   --   --   --  0.70  --   CKTOTAL  --  64  --   --   --   --   --   --   --   --   --   --   TROPONINI 0.04*  --  0.04* 0.03  --   --   --  0.03  --   --   --   --   < > = values in this interval not displayed.  Estimated Creatinine Clearance: 112.5 mL/min (by C-G formula based on Cr of 0.7).   Medical History: Past Medical History  Diagnosis Date  . Sleep apnea   . Hypertension   . Depression   . Arthritis   . Urinary frequency   . Lumbar neuralgia   . Headache(784.0)   . Constipation due to pain medication   . Hepatitis   . Hepatitis B     "years ago"     Assessment: 50 yoM with with a history of HTN, morbid obesity, and OSA not on CPAP who presented to [facility] on  08/26/15 with left hip and flank pain. He was found to have a left ureteral stone. He was also noted to be in new onset atrial fib/flutter.  He was admitted for new onset afib, urosepsis, and for emergent ureteral stent placements.  Pharmacy consulted by cardiology to start heparin infusion without bolus d/t  urology procedure this morning.  Not on anticoagulants PTA.  Heparin level = <0.10 mcg/ml, undetectable again despite increasing heparin infusion to 2800 units/hr.  Heparin level delayed due to patient transfer to floor.  Asked RN about any interruptions to the line.  Upon transfer into the bed, the IV infusion became disconnected but was immediately reattached to another IV site.  Do not think this interuption was significant. RN reports urine remains red (no clots seen). CBC stable.  Goal of Therapy:  Heparin level 0.3-0.7 units/ml Monitor platelets by anticoagulation protocol: Yes   Plan:  Increase IV heparin to 3300 units/hr.  Will avoid bolus given hematuria. Check heparin level 8 hours after rate increase. Daily HL and CBC while on heparin infusion. F/u s/sxs of bleeding. Per Dr. Berneice HeinrichManny, should expect some hematuria.  Clance Bollunyon, Dnasia Gauna, PharmD 08/29/2015,3:34 PM

## 2015-08-29 NOTE — Progress Notes (Signed)
eLink Physician-Brief Progress Note Patient Name: [name] DOB: 1941/06/14 MRN: [id number]   Date of Service  08/29/2015  HPI/Events of Note  Patient with chronic back pain with significant complaints of pain throughout the night.  Usually takes norco at home but at this point has not been cleared for oral intake.  eICU Interventions  Fentanyl 50 mcg IV every 2 hrs as needed     Intervention Category Intermediate Interventions: Pain - evaluation and management  Henry RusselSMITH, Kaesen Rodriguez, P 08/29/2015, 4:01 AM

## 2015-08-29 NOTE — Progress Notes (Signed)
2 Days Post-Op Subjective: Patient reports back pain.  Objective: Vital signs in last 24 hours: Temp:  [98.8 F (37.1 C)-102.1 F (38.9 C)] 100.3 F (37.9 C) (03/12 0000) Resp:  [17-42] 21 (03/12 0500) BP: (128-168)/(71-97) 148/81 mmHg (03/12 0400) SpO2:  [91 %-100 %] 95 % (03/12 0500) Arterial Line BP: (131-189)/(64-81) 151/70 mmHg (03/11 1800) FiO2 (%):  [40 %] 40 % (03/11 1000)  Intake/Output from previous day: 03/11 0701 - 03/12 0700 In: 1460.7 [I.V.:790.7; NG/GT:70; IV Piggyback:600] Out: 4450 [Urine:4450] Intake/Output this shift: Total I/O In: 669.8 [I.V.:369.8; IV Piggyback:300] Out: -   Physical Exam:  Constitutional: Vital signs reviewed. WD WN in NAD  he is conversive. Eyes: PERRL, No scleral icterus.   Pulmonary/Chest: Normal effort Abdominal: Soft. Non-tender, non-distended, bowel sounds are normal, no masses, organomegaly, or guarding present.    Lab Results:  Recent Labs  08/27/15 0941 08/28/15 0615 08/29/15 0426  HGB 12.1* 11.2* 11.7*  HCT 39.4 36.7* 36.5*   BMET  Recent Labs  08/27/15 1245 08/29/15 0426  NA 135 141  K 3.9 3.3*  CL 101 103  CO2 25 28  GLUCOSE 179* 134*  BUN 29* 32*  CREATININE 0.75 0.70  CALCIUM 8.2* 8.7*    Recent Labs  08/26/15 1800 08/27/15 1245  INR 1.09 1.17   No results for input(s): LABURIN in the last 72 hours. Results for orders placed or performed during the hospital encounter of 08/26/15  MRSA PCR Screening     Status: None   Collection Time: 08/27/15 10:11 AM  Result Value Ref Range Status   MRSA by PCR NEGATIVE NEGATIVE Final    Comment:        The GeneXpert MRSA Assay (FDA approved for NASAL specimens only), is one component of a comprehensive MRSA colonization surveillance program. It is not intended to diagnose MRSA infection nor to guide or monitor treatment for MRSA infections.   Culture, Urine     Status: None   Collection Time: 08/27/15 10:15 AM  Result Value Ref Range Status    Specimen Description URINE, CATHETERIZED  Final   Special Requests NONE  Final   Culture   Final    NO GROWTH 1 DAY Performed at [facility]    Report Status 08/28/2015 FINAL  Final  Culture, blood (Routine X 2) w Reflex to ID Panel     Status: None (Preliminary result)   Collection Time: 08/27/15  1:15 PM  Result Value Ref Range Status   Specimen Description BLOOD LEFT ANTECUBITAL  Final   Special Requests BOTTLES DRAWN AEROBIC ONLY 5CC  Final   Culture   Final    NO GROWTH 1 DAY [facility]    Report Status PENDING  Incomplete  Culture, blood (Routine X 2) w Reflex to ID Panel     Status: None (Preliminary result)   Collection Time: 08/27/15  8:22 PM  Result Value Ref Range Status   Specimen Description BLOOD BLOOD LEFT HAND  Final   Special Requests BOTTLES DRAWN AEROBIC ONLY 5CC  Final   Culture   Final    NO GROWTH < 24 HOURS [facility]    Report Status PENDING  Incomplete    Studies/Results: Dg Chest Port 1 View  08/28/2015  CLINICAL DATA:  Acute respiratory failure EXAM: PORTABLE CHEST 1 VIEW COMPARISON:  August 27, 2015 FINDINGS: The ETT is in stable position. An NG tube has been inserted, terminating below today's film. No pneumothorax. Mild  opacity in the right base is likely atelectasis. Recommend attention on follow-up. Cardiomegaly is stable. The hila and mediastinum are unchanged. No overt edema. IMPRESSION: Mild increased opacity in the right base is likely atelectasis. Recommend attention on follow-up. No other acute interval changes. Electronically Signed   By: Gerome Sam III M.D   On: 08/28/2015 06:59   Dg Chest Port 1 View  08/27/2015  CLINICAL DATA:  Endotracheal tube placement. EXAM: PORTABLE CHEST - 1 VIEW COMPARISON:  One-view chest x-ray from the same day. FINDINGS: Of the patient has now been intubated. The endotracheal tube terminates 4.5 cm above the carina, in satisfactory position. Mild pulmonary  vascular congestion is stable. The lung volumes are low. Right apical opacity is unchanged. IMPRESSION: 1. Satisfactory positioning of the endotracheal tube. 2. Low lung volumes and mild pulmonary vascular congestion. Electronically Signed   By: Marin Roberts M.D.   On: 08/27/2015 10:39   Dg Chest Portable 1 View  08/27/2015  CLINICAL DATA:  Preop for cystoscopy. Left ureteral obstruction. Fever. EXAM: PORTABLE CHEST - 1 VIEW COMPARISON:  One-view chest x-ray 08/26/2015. FINDINGS: The heart size is exaggerated by low lung volumes. Mild interstitial coarsening is stable. Asymmetric opacification is again noted at the right lung apex. Recommend continued follow-up with PA and lateral chest radiographs prior to discharge. IMPRESSION: 1. Persistent low lung volumes. 2. Asymmetric right apical airspace opacification. Recommend follow-up PA and lateral chest radiographs as the patient's condition allows. Electronically Signed   By: Marin Roberts M.D.   On: 08/27/2015 07:19   Dg Abd Portable 1v  08/27/2015  CLINICAL DATA:  Gastric tube placement EXAM: PORTABLE ABDOMEN - 1 VIEW COMPARISON:  CT abdomen 08/26/2015 FINDINGS: Gastric tube enters the stomach with the tip near the pylorus. Mildly distended colon compatible with ileus. Bilateral ureteral stents. Left renal calculus. Lumbar scoliosis and multilevel fusion. IMPRESSION: Gastric tube at the level of the pylorus.  Mild colonic ileus Electronically Signed   By: Marlan Palau M.D.   On: 08/27/2015 11:44    Assessment/Plan:   Postoperative day #2 bilateral ureteral stenting for bilateral stones with hydronephrosis. He had associated sepsis. Currently, cultures are negative. He is improving significantly.    Doing well from urologic standpoint-no further intervention needed during this hospitalization at this point.   LOS: 2 days   Marcine Matar M 08/29/2015, 6:35 AM

## 2015-08-29 NOTE — Progress Notes (Signed)
ANTICOAGULATION CONSULT NOTE - Follow Up Consult  Pharmacy Consult for heparin Indication: atrial fibrillation  Allergies  Allergen Reactions  . Gabapentin Other (See Comments)    "makes me loopy"  . Ibuprofen Rash    Hives     Patient Measurements: Height: 6' (182.9 cm) Weight: 284 lb 13.4 oz (129.2 kg) IBW/kg (Calculated) : 77.6  Heparin Dosing Weight: 107 kg  Vital Signs: Temp: 100.3 F (37.9 C) (03/12 0000) Temp Source: Axillary (03/12 0000) BP: 148/81 mmHg (03/12 0400)  Labs:  Recent Labs  08/26/15 1800 08/26/15 1900 08/27/15 0051 08/27/15 0537 08/27/15 0941 08/27/15 1245  08/28/15 0615 08/28/15 1822 08/29/15 0425 08/29/15 0426  HGB 14.1  --   --  13.2 12.1*  --   --  11.2*  --   --  11.7*  HCT 43.1  --   --  39.9 39.4  --   --  36.7*  --   --  36.5*  PLT 132*  --   --  120* 132*  --   --  141*  --   --  173  APTT  --   --   --   --   --  29  --   --   --   --   --   LABPROT 14.3  --   --   --   --  14.7  --   --   --   --   --   INR 1.09  --   --   --   --  1.17  --   --   --   --   --   HEPARINUNFRC  --   --   --   --   --   --   < > <0.10* 0.10* 0.18*  --   CREATININE 0.82  --   --  0.73 0.85 0.75  --   --   --   --   --   CKTOTAL  --  64  --   --   --   --   --   --   --   --   --   TROPONINI 0.04*  --  0.04* 0.03  --   --   --  0.03  --   --   --   < > = values in this interval not displayed.  Estimated Creatinine Clearance: 112.5 mL/min (by C-G formula based on Cr of 0.75).   Medical History: Past Medical History  Diagnosis Date  . Sleep apnea   . Hypertension   . Depression   . Arthritis   . Urinary frequency   . Lumbar neuralgia   . Headache(784.0)   . Constipation due to pain medication   . Hepatitis   . Hepatitis B     "years ago"     Assessment: [age] yoM with with a history of HTN, morbid obesity, and OSA not on CPAP who [facility] with left hip and flank pain. He was found to have a left ureteral stone. He was  also noted to be in new onset atrial fib/flutter.  He was admitted for new onset afib, urosepsis, and for emergent ureteral stent placements.  Pharmacy consulted by cardiology to start heparin infusion without bolus d/t urology procedure this morning.  Not on anticoagulants PTA.  Heparin level = 0.18 mcg/ml with heparin @ 2500 units/hr. Remains subtherapeutic despite rate increase; however now trending up.  RN reports urine  remains red (no clots seen). CBC stable.   Goal of Therapy:  Heparin level 0.3-0.7 units/ml Monitor platelets by anticoagulation protocol: Yes   Plan:  Increase IV heparin to 2800 units/hr. Check heparin level 8 hours after rate increase. Daily HL and CBC while on heparin infusion. F/u s/sxs of bleeding. Per Dr. Berneice Heinrich, should expect some hematuria.  Jarita Raval, Joselyn Glassman, PharmD 08/29/2015,4:53 AM

## 2015-08-29 NOTE — Progress Notes (Signed)
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 He had a fall on 08/24/2015 and after that had felt low back and flank pain. He presented to the emergency room because of this pain on 08/26/2015. He was found to be in atrial fibrillation rapid ventricular rate and admitted to the hospitalist service but overnight became febrile with SIRS, abnormal urinalysis with bacteria and evaluation revealed stones in his distal ureter distal to the utero-vesical junction with some amount of left hydronephrosis. He also had nonobstructing renal calculi on the right side. On 08/27/2015 he underwent cystoscopy with retrograde pyelogram and ureteroscopy and stent placement on the left side. He he was transferred to the ICU with postoperative acute respiratory failure on the ventilator and requiring neosynephrine via peripheral vein. Critical care medicine now assuming primary service 08/27/2015  CULTURES: MRSA PCR 08/26/2015 - neg MSSA PRCR 08/26/2015 - POS Blood culture 3/10 Urine culture 3/10   ANTIBIOTICS: rocephin 3/10 >>3/10 vanc 3/10 > Zosyn 3/10 >> levaquin 3/10 >>3/10  LINES/TUBES: ETT 3/10  SIGNIFICANT EVENTS: 08/26/2015 - admit 08/27/15 - OR  VITAL SIGNS: BP 148/81 mmHg  Pulse 97  Temp(Src) 98 F (36.7 C) (Oral)  Resp 21  Ht 6' (1.829 m)  Wt 129.2 kg (284 lb 13.4 oz)  BMI 38.62 kg/m2  SpO2 95%  HEMODYNAMICS:    VENTILATOR SETTINGS: Vent Mode:  [-]  FiO2 (%):  [40 %] 40 %  INTAKE / OUTPUT: I/O last 3 completed shifts: In: 3477.7  [I.V.:2407.7; NG/GT:70; IV Piggyback:1000] Out: 4098 [[id number] [Urine:6795; Emesis/NG output:490]  PHYSICAL EXAMINATION: General:  Obese male extubated, mildly lethargic but following command and interactive, very poor dentition. Neuro:  Awake and interactive, moving all ext to command. HEENT:  [place]/AT, PERRLA, EOM-I and MMM. Cardiovascular:  Normal heart sounds. Irregularly irregular Lungs:  Clear to auscultation bilaterally. Synchronous with the ventilator Abdomen:  Obese. Soft nontender. No organomegaly Musculoskeletal:  No cyanosis. No clubbing no edema Skin:  Intact  LABS:  PULMONARY  Recent Labs Lab 08/27/15 1016  PHART 7.349*  PCO2ART 46.9*  PO2ART 106*  HCO3 24.5*  TCO2 22.4  O2SAT 97.2   CBC  Recent Labs Lab 08/27/15 0941 08/28/15 0615 08/29/15 0426  HGB 12.1* 11.2* 11.7*  HCT 39.4 36.7* 36.5*  WBC 16.6* 9.1 10.6*  PLT 132* 141* 173   COAGULATION  Recent Labs Lab 08/26/15 1800 08/27/15 1245  INR 1.09 1.17   CARDIAC   Recent Labs Lab 08/26/15 1800 08/27/15 0051 08/27/15 0537 08/28/15 0615  TROPONINI 0.04* 0.04* 0.03 0.03   No results for input(s): PROBNP in the last 168 hours.  CHEMISTRY  Recent Labs Lab 08/26/15 1800 08/27/15 0051 08/27/15 0537 08/27/15 0941 08/27/15 1245 08/28/15 0615 08/29/15 0426  NA 137  --  136 137 135  --  141  K 3.7  --  3.8 3.8 3.9  --  3.3*  CL 99*  --  100* 104 101  --  103  CO2 25  --  24 23 25   --  28  GLUCOSE 161*  --  178* 178* 179*  --  134*  BUN 22*  --  22* 28* 29*  --  32*  CREATININE 0.82  --  0.73 0.85 0.75  --  0.70  CALCIUM 9.5  --  8.7* 8.2* 8.2*  --  8.7*  MG  --  1.9  --   --   --  2.2 2.3  PHOS  --  3.0  --   --   --  1.8* 1.9*   Estimated Creatinine Clearance: 112.5 mL/min (by C-G formula based on Cr of 0.7).  LIVER  Recent Labs Lab 08/26/15 1800 08/27/15 0537 08/27/15 0941 08/27/15 1245  AST --   ALT 14* 15* 17  --   ALKPHOS 80 73 68  --   BILITOT 1.0 0.9 1.3*  --   PROT  6.7 6.6 6.1*  --   ALBUMIN 3.4* 3.0* 3.0*  --   INR 1.09  --   --  1.17   INFECTIOUS  Recent Labs Lab 08/27/15 0941 08/27/15 1245 08/28/15 0615  LATICACIDVEN 2.4* 1.8 1.1   ENDOCRINE CBG (last 3)   Recent Labs  08/28/15 2138 08/29/15 0020 08/29/15 0505  GLUCAP 127* 106* 120*   IMAGING x48h  - image(s) personally visualized  -   highlighted in bold Dg Chest Port 1 View  08/28/2015  CLINICAL DATA:  Acute respiratory failure EXAM: PORTABLE CHEST 1 VIEW COMPARISON:  August 27, 2015 FINDINGS: The ETT is in stable position. An NG tube has been inserted, terminating below today's film. No pneumothorax. Mild opacity in the right base is likely atelectasis. Recommend attention on follow-up. Cardiomegaly is stable. The hila and mediastinum are unchanged. No overt edema. IMPRESSION: Mild increased opacity in the right base is likely atelectasis. Recommend attention on follow-up. No other acute interval changes. Electronically Signed   By: Gerome Sam III M.D   On: 08/28/2015 06:59   Dg Chest Port 1 View  08/27/2015  CLINICAL DATA:  Endotracheal tube placement. EXAM: PORTABLE CHEST - 1 VIEW COMPARISON:  One-view chest x-ray from the same day. FINDINGS: Of the patient has now been intubated. The endotracheal tube terminates 4.5 cm above the carina, in satisfactory position. Mild pulmonary vascular congestion is stable. The lung volumes are low. Right apical opacity is unchanged. IMPRESSION: 1. Satisfactory positioning of the endotracheal tube. 2. Low lung volumes and mild pulmonary vascular congestion. Electronically Signed   By: Marin Roberts M.D.   On: 08/27/2015 10:39   Dg Abd Portable 1v  08/27/2015  CLINICAL DATA:  Gastric tube placement EXAM: PORTABLE ABDOMEN - 1 VIEW COMPARISON:  CT abdomen 08/26/2015 FINDINGS: Gastric tube enters the stomach with the tip near the pylorus. Mildly distended colon compatible with ileus. Bilateral ureteral stents. Left renal calculus. Lumbar scoliosis  and multilevel fusion. IMPRESSION: Gastric tube at the level of the pylorus.  Mild colonic ileus Electronically Signed   By: Marlan Palau M.D.   On: 08/27/2015 11:44   I reviewed CXR myself, no acute disease.  DISCUSSION: Urosepsis with postoperative respiratory failure  ASSESSMENT / PLAN:  PULMONARY A: #Background - Underlying history of OSA not otherwise specified. Details not known. Apparently does not use CPAP - Unclear history of smoking  Current Acute postoperative respiratory failure P:   Titrate O2 for sat of 88-92%. As needed sedation Ambulate once extubated. PT evaluation. IS per RT protocol.  CARDIOVASCULAR A:  Presented with atrial fibrillation. Presumed to be new. Details not known. Currently coming off Neo-Synephrine for perioperative circular to shock  P:  KVO IVF Await echo per cards. D/C pressors. Dc orders for Cardizem and Lopressor Appreciate cardiology consultation Heparin drip for a-fib.  RENAL A:   - Bilateral renal calculi with left sided obstruction and left hydronephrosis but with normal creatinine at presentation - Status post stent placement  P:   Per urology Monitor electrolytes Replace electrolytes as needed. KVO IVF. Lasix 40 mg IV x1.  GASTROINTESTINAL A:   - History of hepatitis B not otherwise specified  P:   NG tube Consult nutrition for TF. Stress ulcer prophylaxis  HEMATOLOGIC A:   At risk for anemia of critical illness P:  DVT prophylaxis Monitor for bleeding Packed red blood cell for hemoglobin less than 7 g percent for anemia of critical illness  INFECTIOUS A:   UTI sepsis P:   Broad antibiotics as above  ENDOCRINE A:   At risk for hyperglycemia   P:   ISS.  NEUROLOGIC A:   #Baseline -History of depression on Cymbalta - hx of chronic pain - nos  #Current - Flank pain due to ureteral calculii  P:   Continue Cymbalta for now - monitor QTC D/C sedation.  FAMILY  - Updates: No family at  bedside but patient updated.  - Inter-disciplinary family meet or Palliative Care meeting due by:  09/03/2015 which is day 7  Discussed with [facility]-MD.  Transfer to tele and to [facility] service with PCCM off 3/13.  Will need dental follow up.  Very poor dentition.  Alyson Reedy, M.D. [facility] Pulmonary/Critical Care Medicine. Pager: [phone]. After hours pager: [phone].  08/29/2015 9:43 AM

## 2015-08-29 NOTE — Progress Notes (Signed)
PROGRESS NOTE  Subjective:   75 y.o. male with a history of HTN, morbid obesity, and OSA not on CPAP who presented to [facility] on 08/26/15 with left hip and flank pain. He was found to have a left ureteral stone. He was also noted to be in new onset atrial fib/flutter and cardiology consulted.   Is in atrial fib   Objective:    Vital Signs:   Temp:  [98 F (36.7 C)-102.1 F (38.9 C)] 98 F (36.7 C) (03/12 0800) Resp:  [17-42] 21 (03/12 0500) BP: (128-168)/(71-97) 148/81 mmHg (03/12 0400) SpO2:  [91 %-100 %] 95 % (03/12 0500) Arterial Line BP: (131-189)/(64-81) 151/70 mmHg (03/11 1800) FiO2 (%):  [40 %] 40 % (03/11 1000)  Last BM Date: 08/28/15   24-hour weight change: Weight change:   Weight trends: Filed Weights   08/27/15 0921  Weight: 284 lb 13.4 oz (129.2 kg)    Intake/Output:  03/11 0701 - 03/12 0700 In: 1510.7 [I.V.:790.7; NG/GT:70; IV Piggyback:650] Out: 5400 [Urine:5400]     Physical Exam: BP 148/81 mmHg  Pulse 97  Temp(Src) 98 F (36.7 C) (Oral)  Resp 21  Ht 6' (1.829 m)  Wt 284 lb 13.4 oz (129.2 kg)  BMI 38.62 kg/m2  SpO2 95%  Wt Readings from Last 3 Encounters:  08/27/15 284 lb 13.4 oz (129.2 kg)  01/03/12 214 lb 4.6 oz (97.2 kg)    General: Vital signs reviewed and noted.   Head: Normocephalic, atraumatic.  intubated  Eyes: conjunctivae/corneas clear.  EOM's intact.   Throat: normal  Neck:  normal   Lungs:   few rhonchi  Heart:  irreg. irreg   Abdomen:  Soft, non-tender, non-distended    Extremities: No c/c/e   Neurologic: A&O X3, CN II - XII are grossly intact.   Psych: Normal     Labs: BMET:  Recent Labs  08/27/15 1245 08/28/15 0615 08/29/15 0426  NA 135  --  141  K 3.9  --  3.3*  CL 101  --  103  CO2 25  --  28  GLUCOSE 179*  --  134*  BUN 29*  --  32*  CREATININE 0.75  --  0.70  CALCIUM 8.2*  --  8.7*  MG  --  2.2 2.3  PHOS  --  1.8* 1.9*    Liver function tests:  Recent Labs  08/27/15 0537 08/27/15 0941    AST 18 27  ALT 15* 17  ALKPHOS 73 68  BILITOT 0.9 1.3*  PROT 6.6 6.1*  ALBUMIN 3.0* 3.0*   No results for input(s): LIPASE, AMYLASE in the last 72 hours.  CBC:  Recent Labs  08/28/15 0615 08/29/15 0426  WBC 9.1 10.6*  NEUTROABS 7.3 8.8*  HGB 11.2* 11.7*  HCT 36.7* 36.5*  MCV 93.4 88.4  PLT 141* 173    Cardiac Enzymes:  Recent Labs  08/26/15 1800 08/26/15 1900 08/27/15 0051 08/27/15 0537 08/28/15 0615  CKTOTAL  --  64  --   --   --   TROPONINI 0.04*  --  0.04* 0.03 0.03    Coagulation Studies:  Recent Labs  08/26/15 1800 08/27/15 1245  LABPROT 14.3 14.7  INR 1.09 1.17    Other: Invalid input(s): POCBNP No results for input(s): DDIMER in the last 72 hours. No results for input(s): HGBA1C in the last 72 hours. No results for input(s): CHOL, HDL, LDLCALC, TRIG, CHOLHDL in the last 72 hours. No results for input(s): TSH, T4TOTAL, T3FREE,  THYROIDAB in the last 72 hours.  Invalid input(s): FREET3 No results for input(s): VITAMINB12, FOLATE, FERRITIN, TIBC, IRON, RETICCTPCT in the last 72 hours.   Other results:  Tele  ( personally reviewed ) A-fib with rate of 90s  Medications:    Infusions: . heparin 2,800 Units/hr (08/29/15 16100613)  . phenylephrine (NEO-SYNEPHRINE) Adult infusion Stopped (08/27/15 [id number])    Scheduled Medications: . antiseptic oral rinse  7 mL Mouth Rinse QID  . chlorhexidine gluconate  15 mL Mouth Rinse BID  . cholecalciferol  2,000 Units Oral Daily  . DULoxetine  90 mg Oral Daily  . insulin aspart  2-6 Units Subcutaneous 6 times per day  . piperacillin-tazobactam (ZOSYN)  IV  3.375 g Intravenous Q8H  . potassium chloride  20 mEq Oral BID  . sodium chloride flush  3 mL Intravenous Q12H  . tamsulosin  0.4 mg Oral QHS  . vancomycin  1,250 mg Intravenous Q12H    Assessment/ Plan:   Principal Problem:   Urolithiasis Active Problems:   Lumbar pseudoarthrosis   Left nephrolithiasis   Chronic back pain   Hypertension   OSA  (obstructive sleep apnea)   Depression   Elevated troponin level   New onset atrial fibrillation (HCC)   Sepsis (HCC)   Severe sepsis (HCC)   Acute respiratory failure with hypoxia (HCC)   Acute respiratory failure (HCC)   Atrial fibrillation (HCC)  1. Atrial fib:   No hx of previous Afib May be due to the renal stone and related complications / respiratory failure Continue conservative therapy for now LV function is normal . CHADS2VASC is at least 2 ( HTN, AGE)   Agree with anticoagulation . Heparin for now.   Eventually would start a DOAC  - would start Eliquis 5 mg BID once Urology gives the OK   2. Troponin - flat curve, not likely related to ACS  3. HTN  - slightly elevated,   He is very aggitated - Continue medical therapy    Disposition:  Length of Stay: 2  Vesta MixerPhilip J. Nahser, Montez HagemanJr., MD, [facility] 08/29/2015, 9:18 AM Office [phone] Pager [phone]

## 2015-08-29 NOTE — Evaluation (Signed)
Clinical/Bedside Swallow Evaluation Patient Details  Name: [name] MRN: [id number] Date of Birth: 1940/09/14  Today's Date: 08/29/2015 Time: SLP Start Time (ACUTE ONLY): 1550 SLP Stop Time (ACUTE ONLY): 1610 SLP Time Calculation (min) (ACUTE ONLY): 20 min  Past Medical History:  Past Medical History  Diagnosis Date  . Sleep apnea   . Hypertension   . Depression   . Arthritis   . Urinary frequency   . Lumbar neuralgia   . Headache(784.0)   . Constipation due to pain medication   . Hepatitis   . Hepatitis B     "years ago"   Past Surgical History:  Past Surgical History  Procedure Laterality Date  . Back surgery      7 lumbar surgeries  . Appendectomy    . Joint replacement      Rt shoulder  . 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 He had a fall on 08/24/2015 and after that had felt low back and flank pain. He presented to the emergency room because of this pain on 08/26/2015. He was found to be in atrial fibrillation rapid ventricular rate and admitted to the hospitalist service but overnight became febrile with SIRS, abnormal urinalysis with bacteria and evaluation revealed stones in his distal ureter distal to the utero-vesical junction with some amount of left hydronephrosis. He also had nonobstructing renal calculi on the right side. On 08/27/2015 he underwent cystoscopy with retrograde pyelogram and ureteroscopy and stent placement on the left side. He he was transferred to the ICU with postoperative  acute respiratory failure on the ventilator and requiring neosynephrine via peripheral vein. Extubated 08/28/15.    Assessment / Plan / Recommendation Clinical Impression  pt demonstrates signs of an acute reversible dysphagia following 2 day intubation including dysphonic vocal quality and immediate coughing with thin liquids. Function is also impaired by pts inability to sit upright secondary to pain. Family reports he normally can sit in a chair, but has been very difficult to transfer since his surgery. SLP provided verbal cues to reduce bolus size and then total assist to pulls straw away (needed straw due to positioning). Nectar thick liquids with small sips eliminate signs of aspiration. Provided externsive verbal instruction to pts son. Expect pts function to improve as airway heals. SLP will f/u for check of tolerance tomorrow and provide instruction on thickening.     Aspiration Risk  Moderate aspiration risk    Diet Recommendation Dysphagia 3 (Mech soft);Nectar-thick liquid   Liquid Administration via: Straw Medication Administration: Whole meds with puree Supervision: Staff to assist with self feeding;Full supervision/cueing for compensatory strategies Compensations: Slow rate;Small sips/bites Postural Changes: Seated upright at 90 degrees    Other  Recommendations Oral Care Recommendations: Oral care BID Other Recommendations: Order thickener from pharmacy   Follow up Recommendations  Inpatient Rehab    Frequency and Duration min 2x/week  2 weeks       Prognosis Prognosis for Safe Diet Advancement: Good      Swallow Study   General HPI: 75 year old male who lives at home with a past medical history of hypertension,  depression, osteophyte disc, lumbar neuralgia, hepatitis B, obstructive sleep apnea not on CPAP, 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 He had a fall on 08/24/2015 and after that had felt low back and flank pain. He presented to the emergency room  because of this pain on 08/26/2015. He was found to be in atrial fibrillation rapid ventricular rate and admitted to the hospitalist service but overnight became febrile with SIRS, abnormal urinalysis with bacteria and evaluation revealed stones in his distal ureter distal to the utero-vesical junction with some amount of left hydronephrosis. He also had nonobstructing renal calculi on the right side. On 08/27/2015 he underwent cystoscopy with retrograde pyelogram and ureteroscopy and stent placement on the left side. He he was transferred to the ICU with postoperative acute respiratory failure on the ventilator and requiring neosynephrine via peripheral vein. Extubated 08/28/15.  Type of Study: Bedside Swallow Evaluation Previous Swallow Assessment: none Diet Prior to this Study: NPO Temperature Spikes Noted: No Respiratory Status: Nasal cannula History of Recent Intubation: Yes Length of Intubations (days): 2 days Date extubated: 08/28/15 Behavior/Cognition: Alert;Cooperative Oral Cavity Assessment: Dry Oral Care Completed by SLP: No Oral Cavity - Dentition: Poor condition;Missing dentition Vision: Functional for self-feeding Self-Feeding Abilities: Needs assist Patient Positioning: Partially reclined;Postural control interferes with function Baseline Vocal Quality: Hoarse Volitional Cough: Strong Volitional Swallow: Able to elicit    Oral/Motor/Sensory Function Overall Oral Motor/Sensory Function: Within functional limits   Ice Chips     Thin Liquid Thin Liquid: Impaired Presentation: Straw Pharyngeal  Phase Impairments: Cough - Immediate    Nectar Thick Nectar Thick Liquid: Impaired Pharyngeal Phase Impairments: Cough - Immediate (with larger sip)   Honey Thick Honey Thick Liquid: Not tested   Puree Puree: Within functional limits   Solid   GO   Solid: Within functional limits       [facility], MA CCC-SLP [phone]  Claudine Mouton 08/29/2015,4:20 PM

## 2015-08-29 NOTE — Evaluation (Addendum)
Physical Therapy Evaluation Patient Details Name: [name] MRN: [id number] DOB: 1941/01/16 Today's Date: 08/29/2015   History of Present Illness  75 yo male admitted with urolithiasis, A fib with RVR, fall. S/p cystoscopy, bil ureteral stents 3/10. Hx of HTN, OA, lumbar neuralgia, Hep B, OSA, morbid obesity  Clinical Impression  On eval, pt required Mod assist +2 for mobility-walked 15' to bathroom with walker ([facility] getting [facility]  Total assist needed for toilet hygiene. On the way out of bathroom, pt yelled out in pain. Pt reports he has R LE pain due to his back. Pt barely made it out of bathroom to recliner parked just outside bathroom door. Recommend ST rehab at SNF.     Follow Up Recommendations SNF    Equipment Recommendations  None recommended by PT    Recommendations for Other Services OT consult     Precautions / Restrictions Precautions Precautions: Fall Restrictions Weight Bearing Restrictions: No      Mobility  Bed Mobility Overal bed mobility: Needs Assistance;+2 for physical assistance;+ 2 for safety/equipment Bed Mobility: Supine to Sit     Supine to sit: Mod assist;+2 for physical assistance;+2 for safety/equipment     General bed mobility comments: Assist for trunk and bil LEs. Increased time.   Transfers Overall transfer level: Needs assistance Equipment used: Rolling walker (2 wheeled) Transfers: Sit to/from Stand Sit to Stand: Mod assist;+2 physical assistance;+2 safety/equipment;From elevated surface         General transfer comment: Assist to rise, stabilize, control descent. Max VCs for posture, distance from RW  Ambulation/Gait Ambulation/Gait assistance: Mod assist;+2 physical assistance;+2 safety/equipment Ambulation Distance (Feet): 15 Feet Assistive device: Rolling walker (2 wheeled) Gait Pattern/deviations: Step-to pattern;Antalgic;Trunk flexed     General Gait Details: Max VCs for posture,  distance from RW. Assist to stabilize/support pt and maneuver safely with RW. Pt fatigues quickly/easily. Pt reported some R LE pain while walking to bathroom. On the way out of bathroom, pt yelled out in pain (from R leg)-had difficulty standing upright. Barely able to make it out of bathroom to chair parked just outside door.   Stairs            Wheelchair Mobility    Modified Rankin (Stroke Patients Only)       Balance Overall balance assessment: Needs assistance         Standing balance support: Bilateral upper extremity supported;During functional activity Standing balance-Leahy Scale: Poor                               Pertinent Vitals/Pain Pain Assessment: Faces Faces Pain Scale: Hurts whole lot Pain Location: R LE with activity Pain Descriptors / Indicators: Sharp;Sore;Grimacing Pain Intervention(s): Limited activity within patient's tolerance;Repositioned    Home Living Family/patient expects to be discharged to:: Private residence Living Arrangements: Spouse/significant other (son lives near) Available Help at Discharge: Family Type of Home: House Home Access: Stairs to enter   Secretary/administratorntrance Stairs-Number of Steps: 2 Home Layout: One level Home Equipment: Environmental consultantWalker - 2 wheels      Prior Function           Comments: uses walker     Hand Dominance        Extremity/Trunk Assessment   Upper Extremity Assessment: Generalized weakness           Lower Extremity Assessment: Generalized weakness;RLE deficits/detail RLE Deficits / Details: pt reports R LE  pain due to back issues.     Cervical / Trunk Assessment: Normal  Communication   Communication: No difficulties  Cognition Arousal/Alertness: Awake/alert Behavior During Therapy: WFL for tasks assessed/performed Overall Cognitive Status: Impaired/Different from baseline Area of Impairment: Safety/judgement         Safety/Judgement: Decreased awareness of safety;Decreased  awareness of deficits          General Comments      Exercises        Assessment/Plan    PT Assessment Patient needs continued PT services  PT Diagnosis Difficulty walking;Abnormality of gait;Acute pain;Generalized weakness   PT Problem List Decreased strength;Decreased activity tolerance;Decreased balance;Decreased mobility;Obesity;Pain;Decreased knowledge of use of DME  PT Treatment Interventions DME instruction;Gait training;Functional mobility training;Therapeutic activities;Patient/family education;Therapeutic exercise;Balance training   PT Goals (Current goals can be found in the Care Plan section) Acute Rehab PT Goals Patient Stated Goal: none stated PT Goal Formulation: With patient Time For Goal Achievement: 09/12/15 Potential to Achieve Goals: Fair    Frequency Min 3X/week   Barriers to discharge        Co-evaluation               End of Session   Activity Tolerance: Patient limited by fatigue;Patient limited by pain Patient left: in chair;with call bell/phone within reach;with chair alarm set           Time: 1202-1229 PT Time Calculation (min) (ACUTE ONLY): 27 min   Charges:   PT Evaluation $PT Eval Moderate Complexity: 1 Procedure PT Treatments $Gait Training: 8-22 mins   PT G Codes:        Rebeca Alert, MPT Pager: [phone]

## 2015-08-29 NOTE — Progress Notes (Signed)
Pharmacy Antibiotic Note  [name] is a 75 y.o. male admitted on 08/26/2015 with presumed urosepsis.  Pharmacy has been consulted for Vancomycin and Zosyn dosing.  Pt found to have left ureteral stone, right renal stones and is now s/p cystoscopy and b/l stent placement 3/10.    Plan:  Increase Vancomycin to 1500mg  IV q12h  Would consider discontinuing vancomycin since blood cultures no growth x 2 days, urine culture negative, and MRSA screen neg.  Continue Zosyn 3.375g IV q8h (4 hour infusion time).   Height: 6' (182.9 cm) Weight: 284 lb 13.4 oz (129.2 kg) IBW/kg (Calculated) : 77.6  Temp (24hrs), Avg:98.8 F (37.1 C), Min:98 F (36.7 C), Max:100.3 F (37.9 C)   Recent Labs Lab 08/26/15 1800 08/27/15 0537 08/27/15 0941 08/27/15 1245 08/28/15 0615 08/29/15 0426 08/29/15 2110  WBC 11.8* 10.2 16.6*  --  9.1 10.6*  --   CREATININE 0.82 0.73 0.85 0.75  --  0.70  --   LATICACIDVEN  --   --  2.4* 1.8 1.1  --   --   VANCOTROUGH  --   --   --   --   --   --  7*    Estimated Creatinine Clearance: 112.5 mL/min (by C-G formula based on Cr of 0.7).    Allergies  Allergen Reactions  . Gabapentin Other (See Comments)    "makes me loopy"  . Ibuprofen Rash    Hives     Antimicrobials this admission: 3/10  Vancomycin >>  3/10 Zosyn >>   Dose adjustments this admission: 3/12 VT = 7 mcg/ml on 1250mg  IV q12h ==> inc to 1500mg  IV q12h  Microbiology results: 3/10 BCx: ngtd 3/10 UCx: NGF  3/10 MRSA PCR: NEG 3/11 BCx: sent  Thank you for allowing pharmacy to be a part of this patient's care.  Maryellen PilePoindexter, Kingsly Kloepfer Trefz, PharmD 08/29/2015 10:08 PM

## 2015-08-30 LAB — PHOSPHORUS: PHOSPHORUS: 2.6 mg/dL (ref 2.5–4.6)

## 2015-08-30 LAB — GLUCOSE, CAPILLARY
GLUCOSE-CAPILLARY: 106 mg/dL — AB (ref 65–99)
GLUCOSE-CAPILLARY: 119 mg/dL — AB (ref 65–99)
GLUCOSE-CAPILLARY: 122 mg/dL — AB (ref 65–99)
GLUCOSE-CAPILLARY: 134 mg/dL — AB (ref 65–99)
Glucose-Capillary: 125 mg/dL — ABNORMAL HIGH (ref 65–99)
Glucose-Capillary: 140 mg/dL — ABNORMAL HIGH (ref 65–99)
Glucose-Capillary: 128 mg/dL — ABNORMAL HIGH (ref 65–99)

## 2015-08-30 LAB — BASIC METABOLIC PANEL
Anion gap: 11 (ref 5–15)
BUN: 27 mg/dL — ABNORMAL HIGH (ref 6–20)
CHLORIDE: 104 mmol/L (ref 101–111)
CO2: 27 mmol/L (ref 22–32)
Calcium: 8.6 mg/dL — ABNORMAL LOW (ref 8.9–10.3)
Creatinine, Ser: 0.58 mg/dL — ABNORMAL LOW (ref 0.61–1.24)
GFR calc Af Amer: >60 mL/min (ref >60)
GFR calc non Af Amer: >60 mL/min (ref >60)
GLUCOSE: 122 mg/dL — AB (ref 65–99)
Potassium: 2.9 mmol/L — ABNORMAL LOW (ref 3.5–5.1)
SODIUM: 142 mmol/L (ref 135–145)

## 2015-08-30 LAB — CBC WITH DIFFERENTIAL/PLATELET
BASOS ABS: 0 10*3/uL (ref 0.0–0.1)
Basophils Relative: 0 %
Eosinophils Absolute: 0 10*3/uL (ref 0.0–0.7)
Eosinophils Relative: 0 %
HCT: 36 % — ABNORMAL LOW (ref 39.0–52.0)
Hemoglobin: 11.2 g/dL — ABNORMAL LOW (ref 13.0–17.0)
LYMPHS ABS: 1.3 10*3/uL (ref 0.7–4.0)
Lymphocytes Relative: 14 %
MCH: 28.6 pg (ref 26.0–34.0)
MCHC: 31.1 g/dL (ref 30.0–36.0)
MCV: 92.1 fL (ref 78.0–100.0)
MONO ABS: 1.1 10*3/uL — AB (ref 0.1–1.0)
Monocytes Relative: 12 %
NEUTROS PCT: 74 %
Neutro Abs: 6.6 10*3/uL (ref 1.7–7.7)
PLATELETS: 211 10*3/uL (ref 150–400)
RBC: 3.91 MIL/uL — AB (ref 4.22–5.81)
RDW: 14.5 % (ref 11.5–15.5)
WBC: 9 10*3/uL (ref 4.0–10.5)

## 2015-08-30 LAB — HEPARIN LEVEL (UNFRACTIONATED)
HEPARIN UNFRACTIONATED: 0.37 [IU]/mL (ref 0.30–0.70)
HEPARIN UNFRACTIONATED: 0.36 [IU]/mL (ref 0.30–0.70)

## 2015-08-30 LAB — MAGNESIUM: MAGNESIUM: 2.2 mg/dL (ref 1.7–2.4)

## 2015-08-30 MED ORDER — PRO-STAT SUGAR FREE PO LIQD
30.0000 mL | Freq: Two times a day (BID) | ORAL | Status: DC
  Administered 2015-08-30 – 2015-08-31 (×3): 30 mL via ORAL
  Filled 2015-08-30 (×3): qty 30

## 2015-08-30 MED ORDER — POTASSIUM CHLORIDE CRYS ER 20 MEQ PO TBCR
40.0000 meq | EXTENDED_RELEASE_TABLET | Freq: Once | ORAL | Status: AC
  Administered 2015-08-30: 40 meq via ORAL
  Filled 2015-08-30: qty 2

## 2015-08-30 MED ORDER — AMOXICILLIN-POT CLAVULANATE 500-125 MG PO TABS
1.0000 | ORAL_TABLET | Freq: Three times a day (TID) | ORAL | Status: DC
  Administered 2015-08-30 – 2015-08-31 (×3): 500 mg via ORAL
  Filled 2015-08-30 (×5): qty 1

## 2015-08-30 MED ORDER — APIXABAN 5 MG PO TABS
5.0000 mg | ORAL_TABLET | Freq: Two times a day (BID) | ORAL | Status: DC
  Administered 2015-08-30 – 2015-08-31 (×3): 5 mg via ORAL
  Filled 2015-08-30 (×3): qty 1

## 2015-08-30 MED ORDER — FENTANYL CITRATE (PF) 100 MCG/2ML IJ SOLN
25.0000 ug | Freq: Four times a day (QID) | INTRAMUSCULAR | Status: DC | PRN
  Administered 2015-08-31: 25 ug via INTRAVENOUS
  Filled 2015-08-30: qty 2

## 2015-08-30 NOTE — Progress Notes (Signed)
[facility] Progress Note   Javonte NAZIRE FRUTH  WUJ:[id number]  DOB: 1940-08-22  DOA: 08/26/2015 PCP: PROVIDER NOT IN SYSTEM  Brief narrative: STEPHON WEATHERS is a 75 y.o. 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 He presented to the emergency room because of this pain on 08/26/2015. He was found to be in atrial fibrillation rapid ventricular rate and admitted to the hospitalist service but overnight became febrile with SIRS, abnormal urinalysis with bacteria and evaluation revealed stones in his distal ureter distal to the utero-vesical junction with some amount of left hydronephrosis. He also had nonobstructing renal calculi on the right side. On 08/27/2015 he underwent cystoscopy with retrograde pyelogram and ureteroscopy and stent placement on the left side. He he was transferred to the ICU with postoperative acute respiratory failure on the ventilator and requiring neosynephrine.   Subjective: Patient has no complaints of nausea vomiting diarrhea constipation cough shortness of breath or pain. He tells me he wants to go home but the nurse says that even to turn in the bed he requires 2 people.  Assessment/Plan: Principal Problem:   Urolithiasis with UTI and hydronephrosis/ sepsis -Status post bilateral stent placement-urology recommends oral antibiotics for 1 week after discharge and outpatient follow-up -All cultures negative so far -DC vancomycin-change Zosyn to Augmentin and watch overnight  Active Problems: Hypokalemia - replacing- Mg+ normal    Lumbar pseudoarthrosis/  Chronic back pain --Continue chronic pain meds-add K pad-mobilize out of bed if possible     Atrial fibrillation [facility]) cardiology  has been in his with management transitioned to Eliquis today - rate controlled      Antibiotics: Anti-infectives    Start      Dose/Rate Route Frequency Ordered Stop   08/29/15 2215  vancomycin (VANCOCIN) 1,500 mg in sodium chloride 0.9 % 500 mL IVPB     1,500 mg 250 mL/hr over 120 Minutes Intravenous Every 12 hours 08/29/15 2206     08/27/15 2200  vancomycin (VANCOCIN) IVPB 1000 mg/200 mL premix  Status:  Discontinued     1,000 mg 200 mL/hr over 60 Minutes Intravenous Every 12 hours 08/27/15 1019 08/27/15 1111   08/27/15 2200  vancomycin (VANCOCIN) 1,250 mg in sodium chloride 0.9 % 250 mL IVPB  Status:  Discontinued     1,250 mg 166.7 mL/hr over 90 Minutes Intravenous Every 12 hours 08/27/15 1111 08/29/15 2204   08/27/15 1400  piperacillin-tazobactam (ZOSYN) IVPB 3.375 g     3.375 g 12.5 mL/hr over 240 Minutes Intravenous Every 8 hours 08/27/15 1020     08/27/15 1300  cefTRIAXone (ROCEPHIN) 2 g in dextrose 5 % 50 mL IVPB  Status:  Discontinued     2 g 100 mL/hr over 30 Minutes Intravenous Every 24 hours 08/27/15 0745 08/27/15 0957   08/27/15 1030  vancomycin (VANCOCIN) IVPB 1000 mg/200 mL premix     1,000 mg 200 mL/hr over 60 Minutes Intravenous  Once 08/27/15 1018 08/27/15 1216   08/27/15 1000  piperacillin-tazobactam (ZOSYN) IVPB 3.375 g  Status:  Discontinued     3.375 g 100 mL/hr over 30 Minutes Intravenous  Once 08/27/15 0957 08/27/15 1001   08/27/15 1000  vancomycin (VANCOCIN) IVPB 1000 mg/200 mL premix  Status:  Discontinued     1,000 mg 200 mL/hr over 60 Minutes Intravenous  Once 08/27/15 0957 08/27/15 1001   08/27/15 0700  vancomycin (VANCOCIN) IVPB 1000 mg/200 mL premix  1,000 mg 200 mL/hr over 60 Minutes Intravenous  Once 08/27/15 0648 08/27/15 0817   08/27/15 0700  piperacillin-tazobactam (ZOSYN) IVPB 3.375 g     3.375 g 100 mL/hr over 30 Minutes Intravenous  Once 08/27/15 0648 08/27/15 0747   08/27/15 0600  levofloxacin (LEVAQUIN) IVPB 500 mg  Status:  Discontinued     500 mg 100 mL/hr over 60 Minutes Intravenous Every 24 hours 08/27/15 0507 08/27/15 0743   08/27/15 0600  cefTRIAXone  (ROCEPHIN) 2 g in dextrose 5 % 50 mL IVPB  Status:  Discontinued     2 g 100 mL/hr over 30 Minutes Intravenous Every 24 hours 08/27/15 0550 08/27/15 0744   08/27/15 0515  levofloxacin (LEVAQUIN) IVPB 750 mg  Status:  Discontinued     750 mg 100 mL/hr over 90 Minutes Intravenous Every 24 hours 08/27/15 0505 08/27/15 0507     Code Status:     Code Status Orders        Start     Ordered   08/27/15 0222  Full code   Continuous     08/27/15 0224    Code Status History    Date Active Date Inactive Code Status Order ID Comments User Context   01/08/2012 10:59 AM 01/09/2012  3:42 PM Full Code [id number]  Estevan Oaks, RN Inpatient     Family Communication:  Disposition Plan: Possibly to skilled nursing facility tomorrow DVT prophylaxis:  Eliquis Consultants:  Urology, pulmonary critical care Procedures:  Bilateral ureteral stent placement    Objective: [facility] Weights   08/27/15 0921  Weight: 129.2 kg (284 lb 13.4 oz)    Intake/Output Summary (Last 24 hours) at 08/30/15 1626 Last data filed at 08/30/15 1127  Gross per 24 hour  Intake   1080 ml  Output   1275 ml  Net   -195 ml     Vitals Filed Vitals:   08/29/15 1416 08/29/15 2127 08/30/15 0533 08/30/15 1530  BP: 159/84 136/75 133/75 135/66  Pulse: 107 103 88   Temp: 98.2 F (36.8 C) 98.2 F (36.8 C) 98.6 F (37 C) 98 F (36.7 C)  TempSrc: Oral Oral Oral Oral  Resp: Height:      Weight:      SpO2: 90% 93% 94% 95%    Exam:  General:  Pt is alert, not in acute distress  HEENT: No icterus, No thrush, oral mucosa moist  Cardiovascular: regular rate and rhythm, S1/S2    Respiratory: clear to auscultation bilaterally   Abdomen: Soft, +Bowel sounds, non tender, non distended, no guarding  MSK: No cyanosis or clubbing- no pedal edema   Data Reviewed: Basic Metabolic Panel:  Recent Labs Lab 08/27/15 0051 08/27/15 0537 08/27/15 0941 08/27/15 1245 08/28/15 0615 08/29/15 0426  08/30/15 0420  NA  --  136 137 135  --  141 142  K  --  3.8 3.8 3.9  --  3.3* 2.9*  CL  --  100* 104 101  --  103 104  CO2  --  --  28 27  GLUCOSE  --  178* 178* 179*  --  134* 122*  BUN  --  22* 28* 29*  --  32* 27*  CREATININE  --  0.73 0.85 0.75  --  0.70 0.58*  CALCIUM  --  8.7* 8.2* 8.2*  --  8.7* 8.6*  MG 1.9  --   --   --  2.2 2.3 2.2  PHOS  3.0  --   --   --  1.8* 1.9* 2.6   Liver Function Tests:  Recent Labs Lab 08/26/15 1800 08/27/15 0537 08/27/15 0941  AST ALT 14* 15* 17  ALKPHOS 80 73 68  BILITOT 1.0 0.9 1.3*  PROT 6.7 6.6 6.1*  ALBUMIN 3.4* 3.0* 3.0*   No results for input(s): LIPASE, AMYLASE in the last 168 hours. No results for input(s): AMMONIA in the last 168 hours. CBC:  Recent Labs Lab 08/26/15 1800 08/27/15 0537 08/27/15 0941 08/28/15 0615 08/29/15 0426 08/30/15 0420  WBC 11.8* 10.2 16.6* 9.1 10.6* 9.0  NEUTROABS 10.1*  --  15.0* 7.3 8.8* 6.6  HGB 14.1 13.2 12.1* 11.2* 11.7* 11.2*  HCT 43.1 39.9 39.4 36.7* 36.5* 36.0*  MCV 87.8 88.7 92.5 93.4 88.4 92.1  PLT 132* 120* 132* 141* 173 211   Cardiac Enzymes:  Recent Labs Lab 08/26/15 1800 08/26/15 1900 08/27/15 0051 08/27/15 0537 08/28/15 0615  CKTOTAL  --  64  --   --   --   TROPONINI 0.04*  --  0.04* 0.03 0.03   BNP (last 3 results) No results for input(s): BNP in the last 8760 hours.  ProBNP (last 3 results) No results for input(s): PROBNP in the last 8760 hours.  CBG:  Recent Labs Lab 08/30/15 0547 08/30/15 0827 08/30/15 0832 08/30/15 1203 08/30/15 1309  GLUCAP 106* 125* 119* 122* 134*    Recent Results (from the past 240 hour(s))  MRSA PCR Screening     Status: None   Collection Time: 08/27/15 10:11 AM  Result Value Ref Range Status   MRSA by PCR NEGATIVE NEGATIVE Final    Comment:        The GeneXpert MRSA Assay (FDA approved for NASAL specimens only), is one component of a comprehensive MRSA colonization surveillance program. It is  not intended to diagnose MRSA infection nor to guide or monitor treatment for MRSA infections.   Culture, Urine     Status: None   Collection Time: 08/27/15 10:15 AM  Result Value Ref Range Status   Specimen Description URINE, CATHETERIZED  Final   Special Requests NONE  Final   Culture   Final    NO GROWTH 1 DAY Performed at [facility]    Report Status 08/28/2015 FINAL  Final  Culture, blood (Routine X 2) w Reflex to ID Panel     Status: None (Preliminary result)   Collection Time: 08/27/15  1:15 PM  Result Value Ref Range Status   Specimen Description BLOOD LEFT ANTECUBITAL  Final   Special Requests BOTTLES DRAWN AEROBIC ONLY 5CC  Final   Culture   Final    NO GROWTH 3 DAYS Performed at [facility]    Report Status PENDING  Incomplete  Culture, blood (Routine X 2) w Reflex to ID Panel     Status: None (Preliminary result)   Collection Time: 08/27/15  8:22 PM  Result Value Ref Range Status   Specimen Description BLOOD BLOOD LEFT HAND  Final   Special Requests BOTTLES DRAWN AEROBIC ONLY 5CC  Final   Culture   Final    NO GROWTH 3 DAYS Performed at [facility]    Report Status PENDING  Incomplete  Culture, blood (routine x 2)     Status: None (Preliminary result)   Collection Time: 08/28/15  6:22 PM  Result Value Ref Range Status   Specimen Description BLOOD RIGHT HAND  Final   Special Requests IN  PEDIATRIC BOTTLE 2CC  Final   Culture   Final    NO GROWTH 1 DAY [facility]    Report Status PENDING  Incomplete  Culture, blood (routine x 2)     Status: None (Preliminary result)   Collection Time: 08/28/15  6:35 PM  Result Value Ref Range Status   Specimen Description BLOOD LEFT HAND  Final   Special Requests IN PEDIATRIC BOTTLE 2CC  Final   Culture   Final    NO GROWTH 1 DAY [facility]    Report Status PENDING  Incomplete     Studies: No results found.  Scheduled Meds:  Scheduled Meds: .  antiseptic oral rinse  7 mL Mouth Rinse QID  . apixaban  5 mg Oral BID  . chlorhexidine gluconate  15 mL Mouth Rinse BID  . cholecalciferol  2,000 Units Oral Daily  . DULoxetine  90 mg Oral Daily  . feeding supplement (PRO-STAT SUGAR FREE 64)  30 mL Oral BID  . insulin aspart  2-6 Units Subcutaneous 6 times per day  . piperacillin-tazobactam (ZOSYN)  IV  3.375 g Intravenous Q8H  . potassium chloride  20 mEq Oral BID  . sodium chloride flush  3 mL Intravenous Q12H  . tamsulosin  0.4 mg Oral QHS  . vancomycin  1,500 mg Intravenous Q12H   Continuous Infusions:   Time spent on care of this patient: 35 min   Lucan Riner, MD 08/30/2015, 4:26 PM  LOS: 3 days   [facility] Office  [phone] Pager - Text Page per [email or web address] If 7PM-7AM, please contact night-coverage [email or web address]

## 2015-08-30 NOTE — Clinical Social Work Note (Signed)
Clinical Social Work Assessment  Patient Details  Name: [name] MRN: [id number] Date of Birth: June 07, 1941  Date of referral:  08/30/15               Reason for consult:  Discharge Planning                Permission sought to share information with:  Family Supports Permission granted to share information::  Yes, Verbal Permission Granted  Name::     Toby Breithaupt  Agency::     Relationship::  wife  Contact Information:  [phone]  Housing/Transportation Living arrangements for the past 2 months:  [place] of Information:  Patient Patient Interpreter Needed:  None Criminal Activity/Legal Involvement Pertinent to Current Situation/Hospitalization:  No - Comment as needed Significant Relationships:  Spouse Lives with:  Spouse Do you feel safe going back to the place where you live?  No Need for family participation in patient care:  Yes (Comment) (request CSW speak with pt wife)  Care giving concerns:  Pt admitted from home with spouse. PT recommending SNF.    Social Worker assessment / plan:  CSW received referral for New SNF.   CSW met with pt at bedside. No family present. CSW discussed recommendation for SNF. Pt agreeable and realizes he cannot return home at this time. Pt requested assist to call pt wife and CSW assisted, but pt wife did not answer phone. Pt provided CSW permission to continue to attempt to reach pt wife to discuss rehab.   CSW completed FL2 and initiated SNF search.  CSW has made 2 more attempts to reach pt wife, but unable to reach. Left voice message.  CSW to follow up with pt and hopefully pt wife to give bed offers. Pt insurance [employer] Medicare requires authorization prior to pt transfer to SNF.   CSW to continue to follow.   Employment status:  Retired Nurse, adult PT Recommendations:  [place] / Referral to community resources:  [place]  Patient/Family's Response to care:  Pt alert and oriented x 4. Pt eager to speak to pt wife but unable to reach pt wife while in room. Pt agreed to SNF search.  Patient/Family's Understanding of and Emotional Response to Diagnosis, Current Treatment, and Prognosis:  Difficult to understand pt speech at time; did not assess further pt understanding of diagnosis, but pt did express understanding of need for rehab.   Emotional Assessment Appearance:  Appears stated age Attitude/Demeanor/Rapport:  Other (appropriate) Affect (typically observed):  Appropriate Orientation:  Oriented to Self, Oriented to Place, Oriented to  Time, Oriented to Situation Alcohol / Substance use:  Not Applicable Psych involvement (Current and /or in the community):  No (Comment)  Discharge Needs  Concerns to be addressed:  Discharge Planning Concerns Readmission within the last 30 days:  No Current discharge risk:  Physical Impairment Barriers to Discharge:  Continued Medical Work up [employer] requires insurance authorization)   Ladell Pier, LCSW 08/30/2015, 6:25 PM  [phone]

## 2015-08-30 NOTE — Care Management Important Message (Signed)
Important Message  Patient Details  Name: [name] MRN: [id number] Date of Birth: Nov 05, 1940   Medicare Important Message Given:  Yes    Haskell FlirtJamison, Zyona Pettaway 08/30/2015, 10:34 AMImportant Message  Patient Details  Name: [name] MRN: [id number] Date of Birth: Nov 05, 1940   Medicare Important Message Given:  Yes    Haskell FlirtJamison, Stanislav Gervase 08/30/2015, 10:33 AM

## 2015-08-30 NOTE — NC FL2 (Signed)
[place] MEDICAID FL2 LEVEL OF CARE SCREENING TOOL     IDENTIFICATION  Patient Name: [name] Birthdate: [birth year] Sex: male Admission Date (Current Location): 08/26/2015  [facility] [place]   Facility and Address:  [facility],  [address], [place] [id number]      Provider Number: [id number]  Attending Physician Name and Address:  Calvert CantorSaima Rizwan, MD  Relative Name and Phone Number:       Current Level of Care: Hospital Recommended Level of Care: Skilled Nursing Facility Prior Approval Number:    Date Approved/Denied:   PASRR Number: [id number]  Discharge Plan: SNF    Current Diagnoses: Patient Active Problem List   Diagnosis Date Noted  . Left nephrolithiasis 08/27/2015  . Urolithiasis 08/27/2015  . Morbid obesity (HCC) 08/27/2015  . Chronic back pain 08/27/2015  . Hypertension 08/27/2015  . OSA (obstructive sleep apnea) 08/27/2015  . Depression 08/27/2015  . Elevated troponin level 08/27/2015  . New onset atrial fibrillation (HCC) 08/27/2015  . Sepsis (HCC) 08/27/2015  . Severe sepsis (HCC) 08/27/2015  . Acute respiratory failure with hypoxia (HCC) 08/27/2015  . Acute respiratory failure (HCC)   . Atrial fibrillation (HCC)   . Lumbar pseudoarthrosis 01/08/2012    Orientation RESPIRATION BLADDER Height & Weight     Self, Place, Situation  Normal Indwelling catheter, Incontinent Weight: 284 lb 13.4 oz (129.2 kg) Height:  6' (182.9 cm)  BEHAVIORAL SYMPTOMS/MOOD NEUROLOGICAL BOWEL NUTRITION STATUS   (no behaviors)  (NONE) Continent Diet (Diet Regular)  AMBULATORY STATUS COMMUNICATION OF NEEDS Skin   Extensive Assist (+2 assist for safety/equipment) Verbally Normal                       Personal Care Assistance Level of Assistance  Bathing, Feeding, Dressing Bathing Assistance: Maximum assistance (+2 assist for safety/equipment) Feeding assistance: Independent Dressing Assistance: Maximum assistance (+2  assist for safety/equipment)     Functional Limitations Info  Sight, Hearing, Speech Sight Info: Adequate Hearing Info: Impaired Speech Info: Adequate    SPECIAL CARE FACTORS FREQUENCY  PT (By licensed PT), OT (By licensed OT)     PT Frequency: 5 x a week OT Frequency: 5 x a week            Contractures Contractures Info: Not present    Additional Factors Info  Code Status, Allergies, Psychotropic, Insulin Sliding Scale Code Status Info: FULL code status Allergies Info: Gabapentin, Ibuprofen Psychotropic Info: cymbalta Insulin Sliding Scale Info: insulin aspart (novoLOG) injection 2-6 Units 6 x a day       Current Medications (08/30/2015):  This is the current hospital active medication list Current Facility-Administered Medications  Medication Dose Route Frequency Provider Last Rate Last Dose  . acetaminophen (TYLENOL) solution 650 mg  650 mg Per Tube Q6H PRN Karl ItoSteven E Sommer, MD   650 mg at 08/28/15 1754  . antiseptic oral rinse solution (CORINZ)  7 mL Mouth Rinse QID Kalman ShanMurali Ramaswamy, MD   7 mL at 08/30/15 0553  . apixaban (ELIQUIS) tablet 5 mg  5 mg Oral BID Vesta MixerPhilip J Nahser, MD      . bisacodyl (DULCOLAX) suppository 10 mg  10 mg Rectal Daily PRN Jeanella CrazeBrandi L Ollis, NP      . chlorhexidine gluconate (PERIDEX) 0.12 % solution 15 mL  15 mL Mouth Rinse BID Kalman ShanMurali Ramaswamy, MD   15 mL at 08/30/15 0800  . cholecalciferol (VITAMIN D) tablet 2,000 Units  2,000 Units Oral Daily  Bobette Mo, MD   2,000 Units at 08/30/15 [phone]  . DULoxetine (CYMBALTA) DR capsule 90 mg  90 mg Oral Daily Bobette Mo, MD   90 mg at 08/30/15 0911  . feeding supplement (PRO-STAT SUGAR FREE 64) liquid 30 mL  30 mL Oral BID Vassie Loll, MD      . fentaNYL (SUBLIMAZE) injection 50 mcg  50 mcg Intravenous Q2H PRN Henry Russel, MD   50 mcg at 08/29/15 2224  . HYDROmorphone (DILAUDID) injection 0.25-0.5 mg  0.25-0.5 mg Intravenous Q5 min PRN Lewie Loron, MD      . insulin aspart (novoLOG)  injection 2-6 Units  2-6 Units Subcutaneous 6 times per day Kalman Shan, MD   2 Units at 08/30/15 [phone]  . metoprolol (LOPRESSOR) injection 2.5 mg  2.5 mg Intravenous Q3H PRN Karl Ito, MD      . ondansetron [facility]) injection 4 mg  4 mg Intravenous Q6H PRN Bobette Mo, MD      . ondansetron [facility]) injection 4 mg  4 mg Intravenous Once PRN Karie Schwalbe, MD      . oxyCODONE-acetaminophen (PERCOCET/ROXICET) 5-325 MG per tablet 1 tablet  1 tablet Oral Q4H PRN Alyson Reedy, MD   1 tablet at 08/30/15 1112  . piperacillin-tazobactam (ZOSYN) IVPB 3.375 g  3.375 g Intravenous Q8H Maryanna Shape Runyon, RPH   3.375 g at 08/30/15 0600  . potassium chloride SA (K-DUR,KLOR-CON) CR tablet 20 mEq  20 mEq Oral BID Bobette Mo, MD   20 mEq at 08/30/15 0911  . promethazine (PHENERGAN) injection 6.25-12.5 mg  6.25-12.5 mg Intravenous Q15 min PRN Lewie Loron, MD      . RESOURCE THICKENUP CLEAR   Oral PRN Kalman Shan, MD      . sodium chloride flush (NS) 0.9 % injection 3 mL  3 mL Intravenous Q12H Bobette Mo, MD   3 mL at 08/29/15 2241  . tamsulosin (FLOMAX) capsule 0.4 mg  0.4 mg Oral QHS Bobette Mo, MD   0.4 mg at 08/29/15 2228  . vancomycin (VANCOCIN) 1,500 mg in sodium chloride 0.9 % 500 mL IVPB  1,500 mg Intravenous Q12H Leann T Poindexter, RPH   1,500 mg at 08/30/15 5409     Discharge Medications: Please see discharge summary for a list of discharge medications.  Relevant Imaging Results:  Relevant Lab Results:   Additional Information SSN: [id number]  KIDD, Selena Lesser A, LCSW

## 2015-08-30 NOTE — Progress Notes (Signed)
PROGRESS NOTE  Subjective:   75 y.o. male with a history of HTN, morbid obesity, and OSA not on CPAP who [facility] with left hip and flank pain. He was found to have a left ureteral stone. He was also noted to be in new onset atrial fib/flutter and cardiology consulted.   Is in atrial fib   Objective:    Vital Signs:   Temp:  [98.2 F (36.8 C)-98.6 F (37 C)] 98.6 F (37 C) (03/13 0533) Pulse Rate:  [88-107] 88 (03/13 0533) Resp:  [18] 18 (03/13 0533) BP: (133-159)/(75-84) 133/75 mmHg (03/13 0533) SpO2:  [90 %-94 %] 94 % (03/13 0533)  Last BM Date: 08/29/15   24-hour weight change: Weight change:   Weight trends: Filed Weights   08/27/15 0921  Weight: 284 lb 13.4 oz (129.2 kg)    Intake/Output:  03/12 0701 - 03/13 0700 In: 1130 [P.O.:480; IV Piggyback:650] Out: 1500 [Urine:1500]     Physical Exam: BP 133/75 mmHg  Pulse 88  Temp(Src) 98.6 F (37 C) (Oral)  Resp 18  Ht 6' (1.829 m)  Wt 284 lb 13.4 oz (129.2 kg)  BMI 38.62 kg/m2  SpO2 94%  Wt Readings from Last 3 Encounters:  08/27/15 284 lb 13.4 oz (129.2 kg)  01/03/12 214 lb 4.6 oz (97.2 kg)    General: Vital signs reviewed and noted.   Head: Normocephalic, atraumatic.  intubated  Eyes: conjunctivae/corneas clear.  EOM's intact.   Throat: normal  Neck:  normal   Lungs:   few rhonchi  Heart:  irreg. irreg   Abdomen:  Soft, non-tender, non-distended    Extremities: No c/c/e   Neurologic: A&O X3, CN II - XII are grossly intact.   Psych: Normal     Labs: BMET:  Recent Labs  08/29/15 0426 08/30/15 0420  NA 141 142  K 3.3* 2.9*  CL 103 104  CO2 28 27  GLUCOSE 134* 122*  BUN 32* 27*  CREATININE 0.70 0.58*  CALCIUM 8.7* 8.6*  MG 2.3 2.2  PHOS 1.9* 2.6    Liver function tests: No results for input(s): AST, ALT, ALKPHOS, BILITOT, PROT, ALBUMIN in the last 72 hours. No results for input(s): LIPASE, AMYLASE in the last 72 hours.  CBC:  Recent Labs  08/29/15 0426  08/30/15 0420  WBC 10.6* 9.0  NEUTROABS 8.8* 6.6  HGB 11.7* 11.2*  HCT 36.5* 36.0*  MCV 88.4 92.1  PLT 173 211    Cardiac Enzymes:  Recent Labs  08/28/15 0615  TROPONINI 0.03    Coagulation Studies:  Recent Labs  08/27/15 1245  LABPROT 14.7  INR 1.17    Other: Invalid input(s): POCBNP No results for input(s): DDIMER in the last 72 hours. No results for input(s): HGBA1C in the last 72 hours. No results for input(s): CHOL, HDL, LDLCALC, TRIG, CHOLHDL in the last 72 hours. No results for input(s): TSH, T4TOTAL, T3FREE, THYROIDAB in the last 72 hours.  Invalid input(s): FREET3 No results for input(s): VITAMINB12, FOLATE, FERRITIN, TIBC, IRON, RETICCTPCT in the last 72 hours.   Other results:  Tele  ( personally reviewed ) A-fib with rate of 90s  Medications:    Infusions: . heparin 3,300 Units/hr (08/30/15 0708)    Scheduled Medications: . antiseptic oral rinse  7 mL Mouth Rinse QID  . chlorhexidine gluconate  15 mL Mouth Rinse BID  . cholecalciferol  2,000 Units Oral Daily  . DULoxetine  90 mg Oral Daily  .  insulin aspart  2-6 Units Subcutaneous 6 times per day  . piperacillin-tazobactam (ZOSYN)  IV  3.375 g Intravenous Q8H  . potassium chloride  20 mEq Oral BID  . sodium chloride flush  3 mL Intravenous Q12H  . tamsulosin  0.4 mg Oral QHS  . vancomycin  1,500 mg Intravenous Q12H    Assessment/ Plan:   Principal Problem:   Urolithiasis Active Problems:   Lumbar pseudoarthrosis   Left nephrolithiasis   Chronic back pain   Hypertension   OSA (obstructive sleep apnea)   Depression   Elevated troponin level   New onset atrial fibrillation (HCC)   Sepsis (HCC)   Severe sepsis (HCC)   Acute respiratory failure with hypoxia (HCC)   Acute respiratory failure (HCC)   Atrial fibrillation (HCC)  1. Atrial fib:   No hx of previous Afib May be due to the renal stone and related complications / respiratory failure LV function is normal . CHADS2VASC is  at least 2 ( HTN, AGE)   Agree with anticoagulation . Will start Eliquis 5 mg BID   Rate is well controlled  2. Troponin - flat curve, not likely related to ACS  3. HTN  - BP is well controlled   Very stable from a cardiac stanpoint.    Will sign off. He will follow up with Dr. Eden Emms in the office Will start Eliquis 5 BID    Disposition:  Length of Stay: 3  Vesta Mixer, Montez Hageman., MD, [facility] 08/30/2015, 11:05 AM Office [phone] Pager [phone]

## 2015-08-30 NOTE — Progress Notes (Signed)
ANTICOAGULATION CONSULT NOTE - Follow Up Consult  Pharmacy Consult for heparin Indication: atrial fibrillation  Allergies  Allergen Reactions  . Gabapentin Other (See Comments)    "makes me loopy"  . Ibuprofen Rash    Hives     Patient Measurements: Height: 6' (182.9 cm) Weight: 284 lb 13.4 oz (129.2 kg) IBW/kg (Calculated) : 77.6  Heparin Dosing Weight: 107 kg  Vital Signs: Temp: 98.6 F (37 C) (03/13 0533) Temp Source: Oral (03/13 0533) BP: 133/75 mmHg (03/13 0533) Pulse Rate: 88 (03/13 0533)  Labs:  Recent Labs  08/27/15 1245  08/28/15 0615  08/29/15 0426 08/29/15 1448 08/29/15 2354 08/30/15 0420 08/30/15 0745  HGB  --   --  11.2*  --  11.7*  --   --  11.2*  --   HCT  --   --  36.7*  --  36.5*  --   --  36.0*  --   PLT  --   --  141*  --  173  --   --  211  --   APTT 29  --   --   --   --   --   --   --   --   LABPROT 14.7  --   --   --   --   --   --   --   --   INR 1.17  --   --   --   --   --   --   --   --   HEPARINUNFRC  --   < > <0.10*  < >  --  <0.10* 0.36  --  0.37  CREATININE 0.75  --   --   --  0.70  --   --  0.58*  --   TROPONINI  --   --  0.03  --   --   --   --   --   --   < > = values in this interval not displayed.  Estimated Creatinine Clearance: 112.5 mL/min (by C-G formula based on Cr of 0.58).   Medical History: Past Medical History  Diagnosis Date  . Sleep apnea   . Hypertension   . Depression   . Arthritis   . Urinary frequency   . Lumbar neuralgia   . Headache(784.0)   . Constipation due to pain medication   . Hepatitis   . Hepatitis B     "years ago"     Assessment: [age] yoM with with a history of HTN, morbid obesity, and OSA not on CPAP who [facility] with left hip and flank pain. He was found to have a left ureteral stone. He was also noted to be in new onset atrial fib/flutter.  He was admitted for new onset afib, urosepsis, and for emergent ureteral stent placements.  Pharmacy consulted by cardiology to  start heparin infusion without bolus d/t urology procedure this morning.  Not on anticoagulants PTA.  Today, 08/30/2015  Heparin level therapeutic on current rate of 3300 units/hr  No reported bleeding  Stable CBC  Goal of Therapy:  Heparin level 0.3-0.7 units/ml Monitor platelets by anticoagulation protocol: Yes   Plan:  1) Continue current IV heparin rate of 3300 units/hr 2) Daily heparin level 3) Noted plan for apixaban when able to start   Hessie KnowsJustin M Manny Vitolo, PharmD, BCPS Pager [phone] 08/30/2015 8:38 AM

## 2015-08-30 NOTE — Progress Notes (Signed)
Patient ID: [name], male   DOB: Nov 07, 1940, 75 y.o.   MRN: [id number]  Subjective: Patient reports that he is not experiencing any flank pain or urinary symptoms.  Objective: Vital signs in last 24 hours: Temp:  [98 F (36.7 Galvan)-98.6 F (37 Galvan)] 98.6 F (37 Galvan) (03/13 0533) Pulse Rate:  [88-107] 88 (03/13 0533) Resp:  [18-30] 18 (03/13 0533) BP: (113-161)/(75-94) 133/75 mmHg (03/13 0533) SpO2:  [90 %-100 %] 94 % (03/13 0533)A  Intake/Output from previous day: 03/12 0701 - 03/13 0700 In: 1130 [P.O.:480; IV Piggyback:650] Out: 1500 [Urine:1500] Intake/Output this shift:    Past Medical History  Diagnosis Date  . Sleep apnea   . Hypertension   . Depression   . Arthritis   . Urinary frequency   . Lumbar neuralgia   . Headache(784.0)   . Constipation due to pain medication   . Hepatitis   . Hepatitis B     "years ago"    Physical Exam:  Lungs - Normal respiratory effort, chest expands symmetrically.  Abdomen - Soft, non-tender & non-distended. No CVAT  Lab Results:  Recent Labs  08/28/15 0615 08/29/15 0426 08/30/15 0420  WBC 9.1 10.6* 9.0  HGB 11.2* 11.7* 11.2*  HCT 36.7* 36.5* 36.0*   BMET  Recent Labs  08/29/15 0426 08/30/15 0420  NA 141 142  K 3.3* 2.9*  CL 103 104  CO2 28 27  GLUCOSE 134* 122*  BUN 32* 27*  CREATININE 0.70 0.58*  CALCIUM 8.7* 8.6*   No results for input(s): LABURIN in the last 72 hours. Results for orders placed or performed during the hospital encounter of 08/26/15  MRSA PCR Screening     Status: None   Collection Time: 08/27/15 10:11 AM  Result Value Ref Range Status   MRSA by PCR NEGATIVE NEGATIVE Final    Comment:        The GeneXpert MRSA Assay (FDA approved for NASAL specimens only), is one component of a comprehensive MRSA colonization surveillance program. It is not intended to diagnose MRSA infection nor to guide or monitor treatment for MRSA infections.   Culture, Urine     Status: None   Collection  Time: 08/27/15 10:15 AM  Result Value Ref Range Status   Specimen Description URINE, CATHETERIZED  Final   Special Requests NONE  Final   Culture   Final    NO GROWTH 1 DAY Performed at [facility]    Report Status 08/28/2015 FINAL  Final  Culture, blood (Routine X 2) w Reflex to ID Panel     Status: None (Preliminary result)   Collection Time: 08/27/15  1:15 PM  Result Value Ref Range Status   Specimen Description BLOOD LEFT ANTECUBITAL  Final   Special Requests BOTTLES DRAWN AEROBIC ONLY 5CC  Final   Culture   Final    NO GROWTH 2 DAYS Performed at [facility]    Report Status PENDING  Incomplete  Culture, blood (Routine X 2) w Reflex to ID Panel     Status: None (Preliminary result)   Collection Time: 08/27/15  8:22 PM  Result Value Ref Range Status   Specimen Description BLOOD BLOOD LEFT HAND  Final   Special Requests BOTTLES DRAWN AEROBIC ONLY 5CC  Final   Culture   Final    NO GROWTH 2 DAYS Performed at [facility]    Report Status PENDING  Incomplete    Studies/Results: No results found.  Assessment:  Left ureteral calculi -  he has 2 stones in his distal left ureter.  They were causing obstruction and he did have bacturia but there no significant white cells in his urine.  In addition his urine culture and blood cultures have remained negative however he did develop significant fever and emergent stent placement was undertaken.  He now has bilateral stents with normal creatinine and normalization of his white count.  I discussed with him the need for antibiotics upon discharge and when he follows up with me in the office I will remove his right ureteral stent and discuss options for treatment of his left ureteral calculi.   Plan:  1.  Oral antibiotics for one week upon discharge. 2.  He will follow-up with me as an outpatient.  Appointment information has been placed on the chart.  [name] 08/30/2015, 7:33 AM

## 2015-08-30 NOTE — Progress Notes (Signed)
PHARMACIST - PHYSICIAN COMMUNICATION CONCERNING:  IV heparin  Please see note written earlier on 08/29/15 by Clance BollAmanda Runyon, PharmD for full details.  In brief, pt on IV heparin for new onset Afib.    Heparin level = 0.36 (Goal 0.3-0.7) with heparin infusing @ 3300 units/hr Urine remains bloody.   No other bleeding or complications with infusion noted.  RECOMMENDATION: Continue heparin infusion @ 3300 units/hr Repeat heparin level in 8 hours to confirm therapeutic rate  Terrilee FilesLeann Mry Lamia, PharmD  08/30/2015, 1:04 AM

## 2015-08-30 NOTE — Clinical Social Work Placement (Signed)
   CLINICAL SOCIAL WORK PLACEMENT  NOTE  Date:  08/30/2015  Patient Details  Name: [name] MRN: [id number] Date of Birth: 1941-05-11  Clinical Social Work is seeking post-discharge placement for this patient at the Skilled  Nursing Facility level of care (*CSW will initial, date and re-position this form in  chart as items are completed):  Yes   Patient/family provided with [place] Clinical Social Work Department's list of facilities offering this level of care within the geographic area requested by the patient (or if unable, by the patient's family).  Yes   Patient/family informed of their freedom to choose among providers that offer the needed level of care, that participate in Medicare, Medicaid or managed care program needed by the patient, have an available bed and are willing to accept the patient.  Yes   Patient/family informed of [facility]'s ownership interest in [facility] and [facility], as well as of the fact that they are under no obligation to receive care at these facilities.  PASRR submitted to EDS on 08/30/15     PASRR number received on 08/30/15     Existing PASRR number confirmed on       FL2 transmitted to all facilities in geographic area requested by pt/family on 08/30/15     FL2 transmitted to all facilities within larger geographic area on       Patient informed that his/her managed care company has contracts with or will negotiate with certain facilities, including the following:            Patient/family informed of bed offers received.  Patient chooses bed at       Physician recommends and patient chooses bed at      Patient to be transferred to   on  .  Patient to be transferred to facility by       Patient family notified on   of transfer.  Name of family member notified:        PHYSICIAN Please sign FL2     Additional Comment:    _______________________________________________ Orson EvaKIDD, Mistie Adney A, LCSW 08/30/2015, 6:28  PM

## 2015-08-30 NOTE — Progress Notes (Signed)
Speech Language Pathology Treatment: Dysphagia  Patient Details Name: [name] L Amble MRN: [id number] DOB: July 07, 1940 Today's Date: 08/30/2015 Time: [id number] SLP Time Calculation (min) (ACUTE ONLY): 22 min  Assessment / Plan / Recommendation Clinical Impression  Pt observed with thin liquids, still in a reclined position with bed tilted in reverse trendeleberg. With SLP provided tactile assist to hold cup and giving intermittent verbal cues for small sips, pt consume straw sips of thin liquids without signs of aspiration. However, when pt attempted to take independent straw sips, he consistently coughed, choked. However, when straw was removed and pt took independent cup sips he tolerated thin liquids without difficulty, likely due to smaller bolus size. Recommend pt upgrade to a regular diet and thin liquids with no straw as long as he is in a tilted position. SLP will f/u for tolerance x1.    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 He had a fall on 08/24/2015 and after that had felt low back and flank pain. He presented to the emergency room because of this pain on 08/26/2015. He was found to be in atrial fibrillation rapid ventricular rate and admitted to the hospitalist service but overnight became febrile with SIRS, abnormal urinalysis with bacteria and evaluation revealed stones in his distal ureter distal to the utero-vesical junction with some amount of left hydronephrosis. He also had nonobstructing renal calculi on the right side. On 08/27/2015 he underwent cystoscopy with retrograde pyelogram and ureteroscopy and stent placement on the left side. He he was transferred to the ICU with postoperative acute respiratory failure on the ventilator and requiring neosynephrine via peripheral vein. Extubated  08/28/15.       SLP Plan  Continue with current plan of care     Recommendations  Diet recommendations: Regular;Thin liquid Liquids provided via: No straw;Cup Medication Administration: Whole meds with puree Compensations: Slow rate;Small sips/bites             Oral Care Recommendations: Oral care BID Follow up Recommendations: 24 hour supervision/assistance Plan: Continue with current plan of care     GO               [facility] Center LLCBonnie Alonna Bartling, MA CCC-SLP [phone]  [name], Xzayvier Fagin Caroline 08/30/2015, 10:27 AM

## 2015-08-30 NOTE — Progress Notes (Signed)
Nutrition Follow-up  DOCUMENTATION CODES:   Morbid obesity  INTERVENTION:  - Will order 30 mL Prostat BID, each supplement provides 100 kcal and 15 grams of protein. - Continue to encourage intake of meals and supplements - RD will continue to monitor for needs  NUTRITION DIAGNOSIS:   Inadequate oral intake related to poor appetite as evidenced by per patient/family report, meal completion < 50%. -revised  GOAL:   Patient will meet greater than or equal to 90% of their needs -unmet  MONITOR:   PO intake, Supplement acceptance, Weight trends, Labs, I & O's  ASSESSMENT:   [name] is a 75 y.o. 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  3/13 Pt extubated 3/11 and nutrition needs adjusted this assessment related to this. No intakes documented since diet advancement following extubation. Pt was seen by SLP yesterday (3/12) and subsequently placed on Dysphagia 3, nectar-thick liquids. SLP re-evaluated pt this AM and he is now on Regular, thin liquids. Pt states that he ate ~50% of breakfast this AM which consisted of eggs, sausage, and grits. Pt states that appetite is fair since extubation.  Pt not meeting needs. Will order Prostat to supplement protein needs. Medications reviewed. Labs reviewed; CBGs: 99-126 mg/dL since 1/613/12 AM, K: 2.9 mmol/L, BUN elevated but trending down, creatinine low and trending down, Ca: 8.6 mg/dL.    3/10 - Patient is currently intubated on ventilator support - MV: 10.4 L/min; Propofol: None - Spoke with pt's family at bedside.  - No complains d/t appetite, weight loss, nausea, vomiting, food access.  - Was eating regularly/at his baseline PTA.  - Pt was intubated with urosepsis and post-op ARF following placement of a uretal  stent.  - Now off pressors.   Diet Order:  Diet regular Room service appropriate?: Yes; Fluid consistency:: Thin  Skin:  Reviewed, no issues  Last BM:  3/12  Height:   Ht Readings from Last 1 Encounters:  08/27/15 6' (1.829 m)    Weight:   Wt Readings from Last 1 Encounters:  08/27/15 284 lb 13.4 oz (129.2 kg)    Ideal Body Weight:  80.9 kg  BMI:  Body mass index is 38.62 kg/(m^2).  Estimated Nutritional Needs:   Kcal:  1675-1935 (13-15 kcal/kg actual weight)  Protein:  100-110 grams  Fluid:  >/= 1.8 L/day  EDUCATION NEEDS:   No education needs identified at this time     Trenton GammonJessica Trellis Vanoverbeke, RD, LDN Inpatient Clinical Dietitian Pager # [phone] After hours/weekend pager # [phone]

## 2015-08-31 DIAGNOSIS — G8929 Other chronic pain: Secondary | ICD-10-CM

## 2015-08-31 DIAGNOSIS — I10 Essential (primary) hypertension: Secondary | ICD-10-CM

## 2015-08-31 DIAGNOSIS — N202 Calculus of kidney with calculus of ureter: Secondary | ICD-10-CM

## 2015-08-31 DIAGNOSIS — S32009K Unspecified fracture of unspecified lumbar vertebra, subsequent encounter for fracture with nonunion: Secondary | ICD-10-CM

## 2015-08-31 DIAGNOSIS — I480 Paroxysmal atrial fibrillation: Secondary | ICD-10-CM

## 2015-08-31 DIAGNOSIS — R7989 Other specified abnormal findings of blood chemistry: Secondary | ICD-10-CM

## 2015-08-31 DIAGNOSIS — M549 Dorsalgia, unspecified: Secondary | ICD-10-CM

## 2015-08-31 HISTORY — DX: Paroxysmal atrial fibrillation: I48.0

## 2015-08-31 LAB — GLUCOSE, CAPILLARY
GLUCOSE-CAPILLARY: 110 mg/dL — AB (ref 65–99)
GLUCOSE-CAPILLARY: 125 mg/dL — AB (ref 65–99)
GLUCOSE-CAPILLARY: 130 mg/dL — AB (ref 65–99)
GLUCOSE-CAPILLARY: 99 mg/dL (ref 65–99)

## 2015-08-31 LAB — BASIC METABOLIC PANEL
Anion gap: 10 (ref 5–15)
BUN: 17 mg/dL (ref 6–20)
CALCIUM: 8.5 mg/dL — AB (ref 8.9–10.3)
CO2: 27 mmol/L (ref 22–32)
CREATININE: 0.6 mg/dL — AB (ref 0.61–1.24)
Chloride: 102 mmol/L (ref 101–111)
Glucose, Bld: 124 mg/dL — ABNORMAL HIGH (ref 65–99)
Potassium: 3.3 mmol/L — ABNORMAL LOW (ref 3.5–5.1)
SODIUM: 139 mmol/L (ref 135–145)

## 2015-08-31 LAB — MAGNESIUM: MAGNESIUM: 1.9 mg/dL (ref 1.7–2.4)

## 2015-08-31 MED ORDER — DILTIAZEM HCL 30 MG PO TABS
30.0000 mg | ORAL_TABLET | Freq: Once | ORAL | Status: DC
  Filled 2015-08-31: qty 1

## 2015-08-31 MED ORDER — DILTIAZEM HCL ER COATED BEADS 120 MG PO CP24: 120.0000 mg | ORAL_CAPSULE | Freq: Every day | ORAL | Status: DC

## 2015-08-31 MED ORDER — DILTIAZEM HCL ER COATED BEADS 120 MG PO CP24
120.0000 mg | ORAL_CAPSULE | Freq: Every day | ORAL | Status: DC
  Administered 2015-08-31: 120 mg via ORAL
  Filled 2015-08-31: qty 1

## 2015-08-31 MED ORDER — AMOXICILLIN-POT CLAVULANATE 500-125 MG PO TABS: 1.0000 | ORAL_TABLET | Freq: Three times a day (TID) | ORAL | Status: DC

## 2015-08-31 MED ORDER — POTASSIUM CHLORIDE CRYS ER 20 MEQ PO TBCR
40.0000 meq | EXTENDED_RELEASE_TABLET | Freq: Once | ORAL | Status: AC
  Administered 2015-08-31: 40 meq via ORAL
  Filled 2015-08-31: qty 2

## 2015-08-31 MED ORDER — APIXABAN 5 MG PO TABS: 5.0000 mg | ORAL_TABLET | Freq: Two times a day (BID) | ORAL | Status: DC

## 2015-08-31 NOTE — Progress Notes (Signed)
CSW received notification that plan has changed for d/c to home.   RNCM aware.   No further social work needs identified at this time.  CSW signing off.   Loletta SpecterSuzanna Eion Timbrook, MSW, LCSW Clinical Social Work [phone]

## 2015-08-31 NOTE — Progress Notes (Signed)
Patient with 3 beat run of Vtach. Pt asymptomatic. VSS. NP on call notified. No new orders placed.

## 2015-08-31 NOTE — Progress Notes (Signed)
Patient ID: [name], male   DOB: 1940/08/13, 75 y.o.   MRN: [id number]  Subjective: Patient reports no new urologic complaints.  Objective: Vital signs in last 24 hours: Temp:  [97.6 F (36.4 C)-98 F (36.7 C)] 97.7 F (36.5 C) (03/14 0542) Pulse Rate:  [84-104] 91 (03/14 0542) Resp:  [20-22] 20 (03/14 0542) BP: (120-162)/(66-102) 133/77 mmHg (03/14 0542) SpO2:  [93 %-95 %] 94 % (03/14 0542)A  Intake/Output from previous day: 03/13 0701 - 03/14 0700 In: 1320 [P.O.:1320] Out: 1625 [Urine:1625] Intake/Output this shift:    Past Medical History  Diagnosis Date  . Sleep apnea   . Hypertension   . Depression   . Arthritis   . Urinary frequency   . Lumbar neuralgia   . Headache(784.0)   . Constipation due to pain medication   . Hepatitis   . Hepatitis B     "years ago"    Physical Exam:  No CVAT or abdominal tenderness. Catheter  draining concentrated urine with no blood  Lab Results:  Recent Labs  08/29/15 0426 08/30/15 0420  WBC 10.6* 9.0  HGB 11.7* 11.2*  HCT 36.5* 36.0*   BMET  Recent Labs  08/30/15 0420 08/31/15 0434  NA 142 139  K 2.9* 3.3*  CL 104 102  CO2 27 27  GLUCOSE 122* 124*  BUN 27* 17  CREATININE 0.58* 0.60*  CALCIUM 8.6* 8.5*   No results for input(s): LABURIN in the last 72 hours. Results for orders placed or performed during the hospital encounter of 08/26/15  MRSA PCR Screening     Status: None   Collection Time: 08/27/15 10:11 AM  Result Value Ref Range Status   MRSA by PCR NEGATIVE NEGATIVE Final    Comment:        The GeneXpert MRSA Assay (FDA approved for NASAL specimens only), is one component of a comprehensive MRSA colonization surveillance program. It is not intended to diagnose MRSA infection nor to guide or monitor treatment for MRSA infections.   Culture, Urine     Status: None   Collection Time: 08/27/15 10:15 AM  Result Value Ref Range Status   Specimen Description URINE, CATHETERIZED  Final   Special Requests NONE  Final   Culture   Final    NO GROWTH 1 DAY [facility]    Report Status 08/28/2015 FINAL  Final  Culture, blood (Routine X 2) w Reflex to ID Panel     Status: None (Preliminary result)   Collection Time: 08/27/15  1:15 PM  Result Value Ref Range Status   Specimen Description BLOOD LEFT ANTECUBITAL  Final   Special Requests BOTTLES DRAWN AEROBIC ONLY 5CC  Final   Culture   Final    NO GROWTH 3 DAYS [facility]    Report Status PENDING  Incomplete  Culture, blood (Routine X 2) w Reflex to ID Panel     Status: None (Preliminary result)   Collection Time: 08/27/15  8:22 PM  Result Value Ref Range Status   Specimen Description BLOOD BLOOD LEFT HAND  Final   Special Requests BOTTLES DRAWN AEROBIC ONLY 5CC  Final   Culture   Final    NO GROWTH 3 DAYS Performed at [facility] [place]    Report Status PENDING  Incomplete  Culture, blood (routine x 2)     Status: None (Preliminary result)   Collection Time: 08/28/15  6:22 PM  Result Value Ref Range Status   Specimen Description  BLOOD RIGHT HAND  Final   Special Requests IN PEDIATRIC BOTTLE 2CC  Final   Culture   Final    NO GROWTH 1 DAY Performed at [facility]    Report Status PENDING  Incomplete  Culture, blood (routine x 2)     Status: None (Preliminary result)   Collection Time: 08/28/15  6:35 PM  Result Value Ref Range Status   Specimen Description BLOOD LEFT HAND  Final   Special Requests IN PEDIATRIC BOTTLE 2CC  Final   Culture   Final    NO GROWTH 1 DAY Performed at [facility]    Report Status PENDING  Incomplete    Studies/Results: No results found.  Assessment: Left distal ureter with stent.  No change in condition. Appears to be ready to go home from a urologic sndpoint.  Plan: 1. DC Foley 2.  Follow-up an outpatient.  Kemar Pandit C 08/31/2015, 7:21 AM

## 2015-08-31 NOTE — Progress Notes (Signed)
PROGRESS NOTE  Subjective:   75 y.o. male with a history of HTN, morbid obesity, and OSA not on CPAP who presented to [facility] on 08/26/15 with left hip and flank pain. He was found to have a left ureteral stone. He was also noted to be in new onset atrial fib/flutter and cardiology consulted.   Is in atrial fib     Objective:    Vital Signs:   Temp:  [97.6 F (36.4 C)-98 F (36.7 C)] 97.7 F (36.5 C) (03/14 0542) Pulse Rate:  [84-104] 91 (03/14 0542) Resp:  [20-22] 20 (03/14 0542) BP: (120-162)/(66-102) 151/98 mmHg (03/14 0935) SpO2:  [93 %-95 %] 94 % (03/14 0542)  Last BM Date: 08/29/15   24-hour weight change: Weight change:   Weight trends: Filed Weights   08/27/15 0921  Weight: 284 lb 13.4 oz (129.2 kg)    Intake/Output:  03/13 0701 - 03/14 0700 In: 1320 [P.O.:1320] Out: 1625 [Urine:1625] Total I/O In: -  Out: 400 [Urine:400]   Physical Exam: BP 151/98 mmHg  Pulse 91  Temp(Src) 97.7 F (36.5 C) (Oral)  Resp 20  Ht 6' (1.829 m)  Wt 284 lb 13.4 oz (129.2 kg)  BMI 38.62 kg/m2  SpO2 94%  Wt Readings from Last 3 Encounters:  08/27/15 284 lb 13.4 oz (129.2 kg)  01/03/12 214 lb 4.6 oz (97.2 kg)    General: Vital signs reviewed and noted.   Head: Normocephalic, atraumatic.  intubated  Eyes: conjunctivae/corneas clear.  EOM's intact.   Throat: normal  Neck:  normal   Lungs:   few rhonchi  Heart:  irreg. irreg   Abdomen:  Soft, non-tender, non-distended    Extremities: No c/c/e   Neurologic: A&O X3, CN II - XII are grossly intact.   Psych: Normal     Labs: BMET:  Recent Labs  08/29/15 0426 08/30/15 0420 08/31/15 0434  NA 141 142 139  K 3.3* 2.9* 3.3*  CL 103 104 102  CO2 GLUCOSE 134* 122* 124*  BUN 32* 27* 17  CREATININE 0.70 0.58* 0.60*  CALCIUM 8.7* 8.6* 8.5*  MG 2.3 2.2 1.9  PHOS 1.9* 2.6  --     Liver function tests: No results for input(s): AST, ALT, ALKPHOS, BILITOT, PROT, ALBUMIN in the last 72 hours. No results  for input(s): LIPASE, AMYLASE in the last 72 hours.  CBC:  Recent Labs  08/29/15 0426 08/30/15 0420  WBC 10.6* 9.0  NEUTROABS 8.8* 6.6  HGB 11.7* 11.2*  HCT 36.5* 36.0*  MCV 88.4 92.1  PLT 173 211    Cardiac Enzymes: No results for input(s): CKTOTAL, CKMB, TROPONINI in the last 72 hours.  Coagulation Studies: No results for input(s): LABPROT, INR in the last 72 hours.  Other: Invalid input(s): POCBNP No results for input(s): DDIMER in the last 72 hours. No results for input(s): HGBA1C in the last 72 hours. No results for input(s): CHOL, HDL, LDLCALC, TRIG, CHOLHDL in the last 72 hours. No results for input(s): TSH, T4TOTAL, T3FREE, THYROIDAB in the last 72 hours.  Invalid input(s): FREET3 No results for input(s): VITAMINB12, FOLATE, FERRITIN, TIBC, IRON, RETICCTPCT in the last 72 hours.   Other results:  Tele  ( personally reviewed ) A-fib with rate of 80s  Medications:    Infusions:    Scheduled Medications: . amoxicillin-clavulanate  1 tablet Oral TID  . antiseptic oral rinse  7 mL Mouth Rinse QID  . apixaban  5 mg Oral BID  .  chlorhexidine gluconate  15 mL Mouth Rinse BID  . cholecalciferol  2,000 Units Oral Daily  . diltiazem  120 mg Oral Daily  . diltiazem  30 mg Oral Once  . DULoxetine  90 mg Oral Daily  . feeding supplement (PRO-STAT SUGAR FREE 64)  30 mL Oral BID  . insulin aspart  2-6 Units Subcutaneous 6 times per day  . sodium chloride flush  3 mL Intravenous Q12H  . tamsulosin  0.4 mg Oral QHS    Assessment/ Plan:   Principal Problem:   Urolithiasis Active Problems:   Lumbar pseudoarthrosis   Left nephrolithiasis   Chronic back pain   Hypertension   OSA (obstructive sleep apnea)   Depression   Elevated troponin level   New onset atrial fibrillation (HCC)   Severe sepsis (HCC)   Acute respiratory failure with hypoxia (HCC)  1. Atrial fib:   No hx of previous Afib May be due to the renal stone and related complications / respiratory  failure LV function is normal . CHADS2VASC is at least 2 ( HTN, AGE)   Agree with anticoagulation . Will start Eliquis 5 mg BID   Will add Cardizem CD 120 mg a day . His medical doctor may titrate up as needed.   2. Troponin - flat curve, not likely related to ACS  3. HTN  - BP is well controlled   Very stable from a cardiac stanpoint.    Will sign off. He will follow up with Dr. Eden EmmsNishan in the office Will start Eliquis 5 BID    Disposition:  Length of Stay: 4  Vesta MixerPhilip J. Nahser, Montez HagemanJr., MD, [facility] 08/31/2015, 1:49 PM Office [phone] Pager [phone]

## 2015-08-31 NOTE — Progress Notes (Signed)
Patient discharged home with family, discharge instructions given and explained to patient/family  And they verbalized understanding, patient denies any pain/distress, accompanied home by son via personal car. No wound noted, scab from skin tear on elbow an buttock.

## 2015-08-31 NOTE — Progress Notes (Signed)
Co-pay for Eliquis is $100.  Spoke with pt who asked that I call his wife concerning HH. Wife declined HH at present time. Encouraged wife to call PCP if [facility].

## 2015-08-31 NOTE — Progress Notes (Signed)
SLP Cancellation Note  Patient Details Name: [name] MRN: [id number] DOB: 02-20-1941   Cancelled treatment:       Reason Eval/Treat Not Completed: Other (comment) (pt reports swallow is back to baseline and he is discharging home today)   [name], MS [facility] SLP [phone]

## 2015-08-31 NOTE — Discharge Summary (Signed)
Physician Discharge Summary  [name] ZOX:[id number] DOB: [birth year] DOA: 08/26/2015  PCP: Leola Brazil, DO-   Admit date: 08/26/2015 Discharge date: 08/31/2015  Time spent: 50 minutes  Recommendations for Outpatient Follow-up:  1. recommended to go to SNF but able to ambulated today with mild assistance- family wanting to take him home 2 Due to new diagnosis of A-fib- PCP to check TSH/ F T4 3 New start on Cardizem- f/u HR and BP  Discharge Condition:stable    Discharge Diagnoses:  Principal Problem:   Urolithiasis Active Problems:   New onset atrial fibrillation (HCC)   Acute respiratory failure with hypoxia (HCC)   Lumbar pseudoarthrosis   Left nephrolithiasis   Chronic back pain   Hypertension   OSA (obstructive sleep apnea)   Depression   Elevated troponin level   Severe sepsis (HCC)   History of present illness:  [name] is a 75 y.o. 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  Per patient, he had a fall on Tuesday that cost in left lower back and left hip pain. He is states that he was able to do basic things at home and ambulate, but states that movement caused significant pain. He is states that the pain was so intense that he called EMS.  Workup in the ER is significant for left nephrolithiasis and ureterolithiasis. Telemetry and EKG showed atrial fibrillation with RVR in the low 100s. His distress improved once his pain resolved.  He was admitted to the hospitalist service but overnight became febrile with SIRS - found to have abnormal urinalysis with bacteria-  evaluation revealed stones in his distal ureter distal to the utero-vesical junction with some amount of left hydronephrosis. He also had nonobstructing renal calculi on the  right side. On 08/27/2015 he underwent cystoscopy with retrograde pyelogram and ureteroscopy and stent placement on the left side. He he was transferred to the ICU with postoperative acute respiratory failure on the ventilator and requiring neosynephrine.   Hospital Course:  Principal Problem:  Urolithiasis with UTI and hydronephrosis/ sepsis CULTURES: MRSA PCR 08/26/2015 - neg MSSA PRCR 08/26/2015 - POS Blood culture 3/10- negative Urine culture 3/10 - negative -Status post bilateral stent placement- although cultures negative, urology strongly recommends oral antibiotics for 1 week after discharge and outpatient follow-up -DC vancomycin 3/13-changed Zosyn to Augmentin and watched overnight- afebrile-     Atrial fibrillation- new diagnosis CHADS2VASC is at least 2 - ECHO mentioned below - cardiology has been in his with management transitioned to Eliquis on 3/13 - he was rate controlled but HR now in low 100s- asymptomatic- I've started Cardizem CD 120 mg  - cardiology agrees and feels he can go home  Hypokalemia - replacing- Mg+ normal   Lumbar pseudoarthrosis/ Chronic back pain --Continue chronic pain meds-added K pad-mobilize out of bed     Consultants: Urology, pulmonary critical care, cardiology  Procedures:  Bilateral ureteral stent placement  ECHO- Study Conclusions  - Left ventricle: The cavity size was normal. There was moderate  concentric hypertrophy. Systolic function was normal. The  estimated ejection fraction was in the range of 55% to 60%. Wall  motion was normal; there were no regional wall motion  abnormalities. The study was not technically sufficient to allow  evaluation of LV diastolic dysfunction due to atrial  fibrillation. - Aortic  valve: Not well visualized. There was mild regurgitation. - Ascending aorta: The ascending aorta was normal in size. - Left atrium: The atrium was mildly dilated. - Right ventricle: Not well visualized. -  Right atrium: The atrium was normal in size. - Tricuspid valve: There was no regurgitation. - Pulmonary arteries: Systolic pressure was within the normal  range. - Inferior vena cava: The vessel was normal in size. - Pericardium, extracardiac: There was no pericardial effusion. Discharge Exam: Filed Weights   08/27/15 1308  Weight: 129.2 kg (284 lb 13.4 oz)   Filed Vitals:   08/31/15 0542 08/31/15 0935  BP: 133/77 151/98  Pulse: 91   Temp: 97.7 F (36.5 C)   Resp: 20     General: AAO x 3, no distress Cardiovascular: RRR, no murmurs  Respiratory: clear to auscultation bilaterally GI: soft, non-tender, non-distended, bowel sound positive  Discharge Instructions You were cared for by a hospitalist during your hospital stay. If you have any questions about your discharge medications or the care you received while you were in the hospital after you are discharged, you can call the unit and asked to speak with the hospitalist on call if the hospitalist that took care of you is not available. Once you are discharged, your primary care physician will handle any further medical issues. Please note that NO REFILLS for any discharge medications will be authorized once you are discharged, as it is imperative that you return to your primary care physician (or establish a relationship with a primary care physician if you do not have one) for your aftercare needs so that they can reassess your need for medications and monitor your lab values.     Medication List    STOP taking these medications        ciprofloxacin 500 MG tablet  Commonly known as:  CIPRO      TAKE these medications        amoxicillin-clavulanate 500-125 MG tablet  Commonly known as:  AUGMENTIN  Take 1 tablet (500 mg total) by mouth 3 (three) times daily.     apixaban 5 MG Tabs tablet  Commonly known as:  ELIQUIS  Take 1 tablet (5 mg total) by mouth 2 (two) times daily.     celecoxib 100 MG capsule  Commonly known  as:  CELEBREX  Take 100 mg by mouth 2 (two) times daily.     diltiazem 120 MG 24 hr capsule  Commonly known as:  CARDIZEM CD  Take 1 capsule (120 mg total) by mouth daily.     DULoxetine 30 MG capsule  Commonly known as:  CYMBALTA  Take 30 mg by mouth daily. Take with 60 mg = 90 mg total     DULoxetine 60 MG capsule  Commonly known as:  CYMBALTA  Take 60 mg by mouth daily. Take with 30 mg = 90 mg total     HYDROcodone-acetaminophen 10-325 MG tablet  Commonly known as:  NORCO  Take 1 tablet by mouth every 6 (six) hours as needed for moderate pain.     tamsulosin 0.4 MG Caps capsule  Commonly known as:  FLOMAX  Take 0.4 mg by mouth at bedtime.     Vitamin D 2000 units tablet  Take 2,000 Units by mouth daily.       Allergies  Allergen Reactions  . Gabapentin Other (See Comments)    "makes me loopy"  . Ibuprofen Rash    Hives    Follow-up Information    Follow  up with Garnett Farm, MD. Go on 09/07/2015.   Specialty:  Urology   Why:  at 11:00  For Post Hospitalization Follow Up, Bring a list of medications or bottles., Bring Insurance & Photo ID, Bring Co-pay, Take your Discharge Instruction to your appt   Contact information:   [address] [place] [address] [phone]       Follow up with Hope Pigeon B, DO.   Specialty:  Internal Medicine   Contact information:   [address] [place] [address] [phone]        The results of significant diagnostics from this hospitalization (including imaging, microbiology, ancillary and laboratory) are listed below for reference.    Significant Diagnostic Studies: Dg Chest 1 View  08/26/2015  CLINICAL DATA:  Fall 2 days prior, with back and left hip pain. EXAM: CHEST 1 VIEW COMPARISON:  Radiograph 01/03/2012 FINDINGS: Lung volumes are low leading to accentuation of the cardiac size and crowding of bronchovascular structures. No confluent airspace disease, large pleural effusion or  pneumothorax. No acute osseous abnormalities are seen. IMPRESSION: Hypoventilatory chest accentuating cardiac size and crowding of bronchovascular structures. Recommend PA and lateral views when patient is able. Electronically Signed   By: Rubye Oaks M.D.   On: 08/26/2015 19:27   Dg Lumbar Spine Complete  08/26/2015  CLINICAL DATA:  Fall tuesday evening EXAM: LUMBAR SPINE - COMPLETE 4+ VIEW COMPARISON:  08/10/05 FINDINGS: Prior L1/2 fusion with hardware. Normal alignment. No acute fracture. Diffuse osseous fusion lumbar spine IMPRESSION: No acute findings. Electronically Signed   By: Esperanza Heir M.D.   On: 08/26/2015 19:27   Dg Chest Port 1 View  08/28/2015  CLINICAL DATA:  Acute respiratory failure EXAM: PORTABLE CHEST 1 VIEW COMPARISON:  August 27, 2015 FINDINGS: The ETT is in stable position. An NG tube has been inserted, terminating below today's film. No pneumothorax. Mild opacity in the right base is likely atelectasis. Recommend attention on follow-up. Cardiomegaly is stable. The hila and mediastinum are unchanged. No overt edema. IMPRESSION: Mild increased opacity in the right base is likely atelectasis. Recommend attention on follow-up. No other acute interval changes. Electronically Signed   By: Gerome Sam III M.D   On: 08/28/2015 06:59   Dg Chest Port 1 View  08/27/2015  CLINICAL DATA:  Endotracheal tube placement. EXAM: PORTABLE CHEST - 1 VIEW COMPARISON:  One-view chest x-ray from the same day. FINDINGS: Of the patient has now been intubated. The endotracheal tube terminates 4.5 cm above the carina, in satisfactory position. Mild pulmonary vascular congestion is stable. The lung volumes are low. Right apical opacity is unchanged. IMPRESSION: 1. Satisfactory positioning of the endotracheal tube. 2. Low lung volumes and mild pulmonary vascular congestion. Electronically Signed   By: Marin Roberts M.D.   On: 08/27/2015 10:39   Dg Chest Portable 1 View  08/27/2015  CLINICAL  DATA:  Preop for cystoscopy. Left ureteral obstruction. Fever. EXAM: PORTABLE CHEST - 1 VIEW COMPARISON:  One-view chest x-ray 08/26/2015. FINDINGS: The heart size is exaggerated by low lung volumes. Mild interstitial coarsening is stable. Asymmetric opacification is again noted at the right lung apex. Recommend continued follow-up with PA and lateral chest radiographs prior to discharge. IMPRESSION: 1. Persistent low lung volumes. 2. Asymmetric right apical airspace opacification. Recommend follow-up PA and lateral chest radiographs as the patient's condition allows. Electronically Signed   By: Marin Roberts M.D.   On: 08/27/2015 07:19   Dg Abd Portable 1v  08/27/2015  CLINICAL DATA:  Gastric tube placement EXAM: PORTABLE ABDOMEN - 1 VIEW COMPARISON:  CT abdomen 08/26/2015 FINDINGS: Gastric tube enters the stomach with the tip near the pylorus. Mildly distended colon compatible with ileus. Bilateral ureteral stents. Left renal calculus. Lumbar scoliosis and multilevel fusion. IMPRESSION: Gastric tube at the level of the pylorus.  Mild colonic ileus Electronically Signed   By: Marlan Palau M.D.   On: 08/27/2015 11:44   Dg Hip Unilat With Pelvis 2-3 Views Left  08/26/2015  CLINICAL DATA:  Fall 2 days prior, persistent left hip pain. EXAM: DG HIP (WITH OR WITHOUT PELVIS) 2-3V LEFT COMPARISON:  None. FINDINGS: The cortical margins of the bony pelvis are intact. No fracture. Pubic symphysis and sacroiliac joints are congruent. Mild degenerative change of both hips. Both femoral heads are well-seated in the respective acetabula. IMPRESSION: No pelvic or left hip fracture. Electronically Signed   By: Rubye Oaks M.D.   On: 08/26/2015 19:25   Ct Cta Abd/pel W/cm &/or W/o Cm  08/26/2015  CLINICAL DATA:  Recent fall with extreme left-sided pain radiating to the back. Clinical concern for abdominal aortic aneurysm or dissection. EXAM: CTA ABDOMEN AND PELVIS wITHOUT AND WITH CONTRAST TECHNIQUE:  Multidetector CT imaging of the abdomen and pelvis was performed using the standard protocol during bolus administration of intravenous contrast. Multiplanar reconstructed images and MIPs were obtained and reviewed to evaluate the vascular anatomy. CONTRAST:  100 cc Omnipaque 350 IV COMPARISON:  Lumbar spine and hip radiographs earlier this day. FINDINGS: Lower chest: Atelectasis in the dependent right greater than left lower lobe. Heart upper limits of normal in size. Coronary artery calcifications. No pleural effusion. Liver: No acute traumatic injury. Prominent size. No evidence of focal lesion allowing for noncontrast and arterial phase imaging. Hepatobiliary: Distended gallbladder containing intraluminal gallstone. No pericholecystic inflammation. Prominence of the common bile duct measures 10 mm at the porta hepatis. Pancreas: Mild fatty atrophy. No ductal dilatation or inflammation. No evidence of traumatic injury. Spleen: Normal for arterial phase imaging. No perisplenic fluid. Spleen is enlarged measuring 15.7 cm cranial caudal. Adrenal glands: No nodule or hemorrhage. Kidneys: Symmetric renal enhancement. No hydronephrosis. No significant perinephric stranding. Nonobstructing bilateral renal stones. There are 2 adjacent 8 mm stones in the distal left ureter at the ureterovesicular junction, no ureteral dilatation. Bilateral renal cysts. There is cortical scarring in the left kidney. Stomach/Bowel: Stomach is decompressed. There are no dilated or thickened small bowel loops. Cecum is medially displaced in the mid abdomen with prominent stool. Moderate stool throughout the remainder the colon. Distal colonic diverticulosis without diverticulitis. The appendix is not visualized. Vascular/Lymphatic: Normal caliber abdominal aorta without aneurysm or dissection. Mild tortuosity and atherosclerosis. No aortic hematoma, soft tissue stranding, or acute aortic abnormality. Celiac, superior and inferior mesenteric  arteries are patent. Single bilateral renal arteries are patent. Tortuosity of both common and external iliac arteries without aneurysmal dilatation. No retroperitoneal fluid. No retroperitoneal adenopathy. Reproductive: Normal sized prostate gland. Bladder: Minimally distended, no wall thickening. Other: No free air, free fluid, or intra-abdominal fluid collection. Tiny fat containing umbilical hernia. Musculoskeletal: There are no acute or suspicious osseous abnormalities. Extensive multilevel postsurgical change in the spine. Posterior hardware at L1-2 with rod and intrapedicular screws, the pedicular screws abut the T12-L1 disc space. No evidence of acute fracture allowing for degree of background chronic change. No fracture of the bony pelvis or hips. Probable bone harvest in the right iliac bone. Review of the MIP images confirms the above findings. IMPRESSION:  1. Normal caliber abdominal aorta without acute aortic abnormality or aortic aneurysm. 2. No evidence of acute traumatic abnormality in the abdomen/pelvis. 3. There are 2 adjacent distal left ureteric stones both measuring 8 mm. No proximal ureteral dilatation or frank hydronephrosis, however these may be intermittently obstructing and cause of patient's left-sided pain. There are nonobstructing stones in both kidneys. Electronically Signed   By: Rubye OaksMelanie  Ehinger M.D.   On: 08/26/2015 20:39    Microbiology: Recent Results (from the past 240 hour(s))  MRSA PCR Screening     Status: None   Collection Time: 08/27/15 10:11 AM  Result Value Ref Range Status   MRSA by PCR NEGATIVE NEGATIVE Final    Comment:        The GeneXpert MRSA Assay (FDA approved for NASAL specimens only), is one component of a comprehensive MRSA colonization surveillance program. It is not intended to diagnose MRSA infection nor to guide or monitor treatment for MRSA infections.   Culture, Urine     Status: None   Collection Time: 08/27/15 10:15 AM  Result Value  Ref Range Status   Specimen Description URINE, CATHETERIZED  Final   Special Requests NONE  Final   Culture   Final    NO GROWTH 1 DAY [facility]formed [facility]    Report Status 08/28/2015 FINAL  Final  Culture, blood (Routine X 2) w Reflex to ID Panel     Status: None (Preliminary result)   Collection Time: 08/27/15  1:15 PM  Result Value Ref Range Status   Specimen Description BLOOD LEFT ANTECUBITAL  Final   Special Requests BOTTLES DRAWN AEROBIC ONLY 5CC  Final   Culture   Final    NO GROWTH 4 DAYS [facility]    Report Status PENDING  Incomplete  Culture, blood (Routine X 2) w Reflex to ID Panel     Status: None (Preliminary result)   Collection Time: 08/27/15  8:22 PM  Result Value Ref Range Status   Specimen Description BLOOD BLOOD LEFT HAND  Final   Special Requests BOTTLES DRAWN AEROBIC ONLY 5CC  Final   Culture   Final    NO GROWTH 4 DAYS Performed at [facility]    Report Status PENDING  Incomplete  Culture, blood (routine x 2)     Status: None (Preliminary result)   Collection Time: 08/28/15  6:22 PM  Result Value Ref Range Status   Specimen Description BLOOD RIGHT HAND  Final   Special Requests IN PEDIATRIC BOTTLE 2CC  Final   Culture   Final    NO GROWTH 2 DAYS Performed at [facility]    Report Status PENDING  Incomplete  Culture, blood (routine x 2)     Status: None (Preliminary result)   Collection Time: 08/28/15  6:35 PM  Result Value Ref Range Status   Specimen Description BLOOD LEFT HAND  Final   Special Requests IN PEDIATRIC BOTTLE 2CC  Final   Culture   Final    NO GROWTH 2 DAYS Performed at [facility]     Report Status PENDING  Incomplete     Labs: Basic Metabolic Panel:  Recent Labs Lab 08/27/15 0051  08/27/15 0941 08/27/15 1245 08/28/15 0615 08/29/15 0426 08/30/15 0420 08/31/15 0434  NA  --   < > 137 135  --  141 142 139  K  --   < > 3.8 3.9  --  3.3* 2.9* 3.3*  CL  --   < >  104  101  --  103 104 102  CO2  --   < > 23 25  --  28 27 27   GLUCOSE  --   < > 178* 179*  --  134* 122* 124*  BUN  --   < > 28* 29*  --  32* 27* 17  CREATININE  --   < > 0.85 0.75  --  0.70 0.58* 0.60*  CALCIUM  --   < > 8.2* 8.2*  --  8.7* 8.6* 8.5*  MG 1.9  --   --   --  2.2 2.3 2.2 1.9  PHOS 3.0  --   --   --  1.8* 1.9* 2.6  --   < > = values in this interval not displayed. Liver Function Tests:  Recent Labs Lab 08/26/15 1800 08/27/15 0537 08/27/15 0941  AST 21 18 27   ALT 14* 15* 17  ALKPHOS 80 73 68  BILITOT 1.0 0.9 1.3*  PROT 6.7 6.6 6.1*  ALBUMIN 3.4* 3.0* 3.0*   No results for input(s): LIPASE, AMYLASE in the last 168 hours. No results for input(s): AMMONIA in the last 168 hours. CBC:  Recent Labs Lab 08/26/15 1800 08/27/15 0537 08/27/15 0941 08/28/15 0615 08/29/15 0426 08/30/15 0420  WBC 11.8* 10.2 16.6* 9.1 10.6* 9.0  NEUTROABS 10.1*  --  15.0* 7.3 8.8* 6.6  HGB 14.1 13.2 12.1* 11.2* 11.7* 11.2*  HCT 43.1 39.9 39.4 36.7* 36.5* 36.0*  MCV 87.8 88.7 92.5 93.4 88.4 92.1  PLT 132* 120* 132* 141* 173 211   Cardiac Enzymes:  Recent Labs Lab 08/26/15 1800 08/26/15 1900 08/27/15 0051 08/27/15 0537 08/28/15 0615  CKTOTAL  --  64  --   --   --   TROPONINI 0.04*  --  0.04* 0.03 0.03   BNP: BNP (last 3 results) No results for input(s): BNP in the last 8760 hours.  ProBNP (last 3 results) No results for input(s): PROBNP in the last 8760 hours.  CBG:  Recent Labs Lab 08/30/15 2032 08/31/15 0003 08/31/15 0408 08/31/15 0744 08/31/15 1114  GLUCAP 128* 99 125* 110* 130*       SignedCalvert Cantor, MD [facility] 08/31/2015, 2:11 PM

## 2015-09-01 LAB — CULTURE, BLOOD (ROUTINE X 2)
CULTURE: NO GROWTH
Culture: NO GROWTH

## 2015-09-03 LAB — CULTURE, BLOOD (ROUTINE X 2)
Culture: NO GROWTH
Culture: NO GROWTH

## 2015-10-05 ENCOUNTER — Other Ambulatory Visit: Payer: Self-pay | Admitting: Urology

## 2015-10-11 ENCOUNTER — Encounter (HOSPITAL_BASED_OUTPATIENT_CLINIC_OR_DEPARTMENT_OTHER): Payer: Self-pay | Admitting: *Deleted

## 2015-10-11 NOTE — Progress Notes (Addendum)
[facility] Istat ,Ekg,KUB on arrival- office note from Dr Beverely Paceheek cardiologist with chart.Verified eliquis was d/c per MD,but to continue cardizem-wife had stopped this along with eliquis,so called her back to inform her.She will call office to obtain Rx.Npo after Mn-including no snuff /may take pain meds for back pain,cardizem with small amt water only.Unable to walk long distances due to arthritis,chronic back pain.

## 2015-10-18 ENCOUNTER — Encounter (HOSPITAL_BASED_OUTPATIENT_CLINIC_OR_DEPARTMENT_OTHER)
Admission: RE | Disposition: A | Discharge status: Home / Self Care | Payer: Self-pay | Source: Ambulatory Visit | Attending: Urology

## 2015-10-18 ENCOUNTER — Ambulatory Visit (HOSPITAL_BASED_OUTPATIENT_CLINIC_OR_DEPARTMENT_OTHER): Payer: Medicare HMO | Admitting: Anesthesiology

## 2015-10-18 ENCOUNTER — Ambulatory Visit (HOSPITAL_BASED_OUTPATIENT_CLINIC_OR_DEPARTMENT_OTHER)
Admission: RE | Admit: 2015-10-18 | Discharge: 2015-10-18 | Disposition: A | Discharge status: Home / Self Care | Payer: Medicare HMO | Source: Ambulatory Visit | Attending: Urology | Admitting: Urology

## 2015-10-18 ENCOUNTER — Ambulatory Visit (HOSPITAL_COMMUNITY): Payer: Medicare HMO

## 2015-10-18 ENCOUNTER — Encounter (HOSPITAL_BASED_OUTPATIENT_CLINIC_OR_DEPARTMENT_OTHER): Payer: Self-pay | Admitting: Anesthesiology

## 2015-10-18 DIAGNOSIS — N201 Calculus of ureter: Secondary | ICD-10-CM | POA: Insufficient documentation

## 2015-10-18 DIAGNOSIS — Z8619 Personal history of other infectious and parasitic diseases: Secondary | ICD-10-CM | POA: Diagnosis not present

## 2015-10-18 DIAGNOSIS — Z6841 Body Mass Index (BMI) 40.0 and over, adult: Secondary | ICD-10-CM | POA: Insufficient documentation

## 2015-10-18 DIAGNOSIS — Z7901 Long term (current) use of anticoagulants: Secondary | ICD-10-CM | POA: Diagnosis not present

## 2015-10-18 DIAGNOSIS — I10 Essential (primary) hypertension: Secondary | ICD-10-CM | POA: Diagnosis not present

## 2015-10-18 DIAGNOSIS — Z79899 Other long term (current) drug therapy: Secondary | ICD-10-CM | POA: Insufficient documentation

## 2015-10-18 DIAGNOSIS — I4891 Unspecified atrial fibrillation: Secondary | ICD-10-CM | POA: Diagnosis not present

## 2015-10-18 DIAGNOSIS — G4730 Sleep apnea, unspecified: Secondary | ICD-10-CM | POA: Diagnosis not present

## 2015-10-18 HISTORY — PX: CYSTOSCOPY/URETEROSCOPY/HOLMIUM LASER/STENT PLACEMENT: SHX6546

## 2015-10-18 LAB — POCT I-STAT, CHEM 8
BUN: 22 mg/dL — ABNORMAL HIGH (ref 6–20)
Calcium, Ion: 1.23 mmol/L (ref 1.13–1.30)
Chloride: 102 mmol/L (ref 101–111)
Creatinine, Ser: 0.6 mg/dL — ABNORMAL LOW (ref 0.61–1.24)
Glucose, Bld: 114 mg/dL — ABNORMAL HIGH (ref 65–99)
HCT: 35 % — ABNORMAL LOW (ref 39.0–52.0)
Hemoglobin: 11.9 g/dL — ABNORMAL LOW (ref 13.0–17.0)
Potassium: 3.9 mmol/L (ref 3.5–5.1)
Sodium: 143 mmol/L (ref 135–145)
TCO2: 28 mmol/L (ref 0–100)

## 2015-10-18 SURGERY — CYSTOSCOPY/URETEROSCOPY/HOLMIUM LASER/STENT PLACEMENT
Anesthesia: General | Site: Ureter | Laterality: Left

## 2015-10-18 MED ORDER — FENTANYL CITRATE (PF) 100 MCG/2ML IJ SOLN
INTRAMUSCULAR | Status: AC
  Filled 2015-10-18: qty 2

## 2015-10-18 MED ORDER — FENTANYL CITRATE (PF) 100 MCG/2ML IJ SOLN
INTRAMUSCULAR | Status: DC | PRN
  Administered 2015-10-18: 12.5 ug via INTRAVENOUS
  Administered 2015-10-18: 25 ug via INTRAVENOUS
  Administered 2015-10-18: 12.5 ug via INTRAVENOUS
  Administered 2015-10-18 (×2): 25 ug via INTRAVENOUS
  Administered 2015-10-18 (×2): 12.5 ug via INTRAVENOUS
  Administered 2015-10-18 (×3): 25 ug via INTRAVENOUS

## 2015-10-18 MED ORDER — METOCLOPRAMIDE HCL 5 MG/ML IJ SOLN
INTRAMUSCULAR | Status: AC
  Filled 2015-10-18: qty 2

## 2015-10-18 MED ORDER — CIPROFLOXACIN IN D5W 400 MG/200ML IV SOLN
INTRAVENOUS | Status: AC
  Filled 2015-10-18: qty 200

## 2015-10-18 MED ORDER — METOCLOPRAMIDE HCL 5 MG/ML IJ SOLN
INTRAMUSCULAR | Status: DC | PRN
  Administered 2015-10-18: 10 mg via INTRAVENOUS

## 2015-10-18 MED ORDER — LIDOCAINE HCL (CARDIAC) 20 MG/ML IV SOLN
INTRAVENOUS | Status: DC | PRN
  Administered 2015-10-18: 80 mg via INTRAVENOUS

## 2015-10-18 MED ORDER — PHENAZOPYRIDINE HCL 100 MG PO TABS
ORAL_TABLET | ORAL | Status: AC
  Filled 2015-10-18: qty 2

## 2015-10-18 MED ORDER — DEXAMETHASONE SODIUM PHOSPHATE 4 MG/ML IJ SOLN
INTRAMUSCULAR | Status: DC | PRN
  Administered 2015-10-18: 10 mg via INTRAVENOUS

## 2015-10-18 MED ORDER — TAMSULOSIN HCL 0.4 MG PO CAPS
0.4000 mg | ORAL_CAPSULE | Freq: Once | ORAL | Status: AC
  Administered 2015-10-18: 0.4 mg via ORAL
  Filled 2015-10-18: qty 1

## 2015-10-18 MED ORDER — PROPOFOL 10 MG/ML IV BOLUS
INTRAVENOUS | Status: DC | PRN
  Administered 2015-10-18: 200 mg via INTRAVENOUS

## 2015-10-18 MED ORDER — PHENAZOPYRIDINE HCL 200 MG PO TABS: 200.0000 mg | ORAL_TABLET | Freq: Three times a day (TID) | ORAL | Status: DC | PRN

## 2015-10-18 MED ORDER — LIDOCAINE HCL (CARDIAC) 20 MG/ML IV SOLN
INTRAVENOUS | Status: AC
  Filled 2015-10-18: qty 5

## 2015-10-18 MED ORDER — FENTANYL CITRATE (PF) 100 MCG/2ML IJ SOLN
25.0000 ug | INTRAMUSCULAR | Status: DC | PRN
  Filled 2015-10-18: qty 1

## 2015-10-18 MED ORDER — CIPROFLOXACIN IN D5W 400 MG/200ML IV SOLN
400.0000 mg | INTRAVENOUS | Status: AC
Start: 2015-10-18 — End: 2015-10-18
  Administered 2015-10-18: 400 mg via INTRAVENOUS
  Filled 2015-10-18: qty 200

## 2015-10-18 MED ORDER — LACTATED RINGERS IV SOLN
INTRAVENOUS | Status: DC
  Administered 2015-10-18: 09:00:00 via INTRAVENOUS
  Filled 2015-10-18: qty 1000

## 2015-10-18 MED ORDER — PHENAZOPYRIDINE HCL 200 MG PO TABS
200.0000 mg | ORAL_TABLET | Freq: Once | ORAL | Status: AC
  Administered 2015-10-18: 200 mg via ORAL
  Filled 2015-10-18: qty 1

## 2015-10-18 MED ORDER — SODIUM CHLORIDE 0.9 % IR SOLN
Status: DC | PRN
  Administered 2015-10-18: 3000 mL
  Administered 2015-10-18: 1000 mL

## 2015-10-18 MED ORDER — PROPOFOL 10 MG/ML IV BOLUS
INTRAVENOUS | Status: AC
  Filled 2015-10-18: qty 20

## 2015-10-18 MED ORDER — LACTATED RINGERS IV SOLN
INTRAVENOUS | Status: DC
  Filled 2015-10-18: qty 1000

## 2015-10-18 MED ORDER — CEFAZOLIN SODIUM-DEXTROSE 2-4 GM/100ML-% IV SOLN
INTRAVENOUS | Status: AC
  Filled 2015-10-18: qty 100

## 2015-10-18 MED ORDER — TAMSULOSIN HCL 0.4 MG PO CAPS
ORAL_CAPSULE | ORAL | Status: AC
  Filled 2015-10-18: qty 1

## 2015-10-18 MED ORDER — ONDANSETRON HCL 4 MG/2ML IJ SOLN
INTRAMUSCULAR | Status: AC
  Filled 2015-10-18: qty 2

## 2015-10-18 MED ORDER — HYDROCODONE-ACETAMINOPHEN 10-325 MG PO TABS: 1.0000 | ORAL_TABLET | ORAL | Status: DC | PRN

## 2015-10-18 MED ORDER — ONDANSETRON HCL 4 MG/2ML IJ SOLN
INTRAMUSCULAR | Status: DC | PRN
  Administered 2015-10-18: 4 mg via INTRAVENOUS

## 2015-10-18 MED ORDER — IOPAMIDOL (ISOVUE-370) INJECTION 76%
INTRAVENOUS | Status: DC | PRN
  Administered 2015-10-18: 10 mL

## 2015-10-18 SURGICAL SUPPLY — 28 items
ADAPTER CATH URET PLST 4-6FR (CATHETERS) IMPLANT
BAG DRAIN URO-CYSTO SKYTR STRL (DRAIN) ×2 IMPLANT
BASKET DAKOTA 1.9FR 11X120 (BASKET) ×2 IMPLANT
BASKET LASER NITINOL 1.9FR (BASKET) IMPLANT
BASKET ZERO TIP NITINOL 2.4FR (BASKET) IMPLANT
CATH INTERMIT  6FR 70CM (CATHETERS) ×2 IMPLANT
CLOTH BEACON ORANGE TIMEOUT ST (SAFETY) ×2 IMPLANT
FIBER LASER FLEXIVA 365 (UROLOGICAL SUPPLIES) IMPLANT
FIBER LASER FLEXIVA 550 (UROLOGICAL SUPPLIES) IMPLANT
FIBER LASER TRAC TIP (UROLOGICAL SUPPLIES) ×2 IMPLANT
GLOVE BIO SURGEON STRL SZ8 (GLOVE) ×2 IMPLANT
GLOVE SURG SS PI 7.5 STRL IVOR (GLOVE) ×4 IMPLANT
GOWN STRL REUS W/ TWL LRG LVL3 (GOWN DISPOSABLE) ×1 IMPLANT
GOWN STRL REUS W/ TWL XL LVL3 (GOWN DISPOSABLE) ×1 IMPLANT
GOWN STRL REUS W/TWL LRG LVL3 (GOWN DISPOSABLE) ×1
GOWN STRL REUS W/TWL XL LVL3 (GOWN DISPOSABLE) ×1
GUIDEWIRE 0.038 PTFE COATED (WIRE) IMPLANT
GUIDEWIRE ANG ZIPWIRE 038X150 (WIRE) IMPLANT
GUIDEWIRE STR DUAL SENSOR (WIRE) ×2 IMPLANT
IV NS IRRIG 3000ML ARTHROMATIC (IV SOLUTION) ×2 IMPLANT
KIT ROOM TURNOVER WOR (KITS) ×2 IMPLANT
MANIFOLD NEPTUNE II (INSTRUMENTS) ×2 IMPLANT
PACK CYSTO (CUSTOM PROCEDURE TRAY) ×2 IMPLANT
SHEATH ACCESS URETERAL 38CM (SHEATH) IMPLANT
STENT URET 6FRX26 CONTOUR (STENTS) ×2 IMPLANT
SYRINGE IRR TOOMEY STRL 70CC (SYRINGE) ×2 IMPLANT
TUBE CONNECTING 12X1/4 (SUCTIONS) ×2 IMPLANT
WATER STERILE IRR 500ML POUR (IV SOLUTION) ×2 IMPLANT

## 2015-10-18 NOTE — Op Note (Signed)
PATIENT:  [name]  PRE-OPERATIVE DIAGNOSIS:  left Ureteral calculi  POST-OPERATIVE DIAGNOSIS: Same  PROCEDURE:  1. Left stent removal 2. Left retrograde pyelogram with interpretation 3. Left ureteroscopy, laser lithotripsy and stent replacement  SURGEON: Garnett FarmMark C Ranell Finelli, MD  INDICATION: Mr. [name] is a 75 year old male who presented with obstructing stones and associated sepsis. He underwent an emergent stent placement and has been treated with antibiotics. He returns today for management of his 2 left distal ureteral stones. His stent remains in place as noted on his preoperative KUB and the 2 stones are noted in his left distal ureter.  ANESTHESIA:  General  EBL:  Minimal  DRAINS: 6 French, 26 cm double-J stent in the left ureter (with string)  SPECIMEN:  Stone given to patient  DESCRIPTION OF PROCEDURE: The patient was taken to the major OR and placed on the table. General anesthesia was administered and then the patient was moved to the dorsal lithotomy position. The genitalia was sterilely prepped and draped. An official timeout was performed.  Initially the 23 French cystoscope with 30 lens was passed under direct vision into the bladder. The bladder was then fully inspected. It was noted be free of any tumors, stones or inflammatory lesions other than the mild inflammation associated with the stent exiting his left ureteral orifice. Ureteral orifices were of normal configuration and position.  An alligator grasper was then used to grasp the stent exiting his left ureter and it was withdrawn through the urethra out the meatus. A 0.038 inch floppy-tipped guidewire was then passed through the stent and up into the area of the renal pelvis under fluoroscopic guidance.   A 6 French open-ended ureteral catheter was then passed through the cystoscope into the ureteral orifice in order to perform a left retrograde pyelogram.  A retrograde pyelogram was performed by injecting  full-strength contrast up the left ureter under direct fluoroscopic control. It revealed a filling defects in the distal ureter consistent with the stones seen on the preoperative KUB. The remainder of the ureter was noted to be normal as was the intrarenal collecting system. I then proceeded with ureteroscopy.  A 6 French short, rigid ureteroscope was then passed under direct into the bladder and into the left orifice and up the ureter. The first stone was identified and I felt it was too large to extract and therefore elected to proceed with laser lithotripsy. The 200  holmium laser fiber was used to fragment the stone. I then used the [place] basket to extract all of the stone fragments. I then fragmented and extracted the remaining fragments of the second stone and reinspection of the ureter ureteroscopically revealed no further stone fragments and no injury to the ureter. I then backloaded the cystoscope over the guidewire and passed the stent over the guidewire into the area of the renal pelvis. As the guidewire was removed good curl was noted in the renal pelvis. The bladder was drained and the cystoscope was then removed. The patient tolerated the procedure well no intraoperative complications.  PLAN OF CARE: Discharge to home after PACU  PATIENT DISPOSITION:  PACU - hemodynamically stable.

## 2015-10-18 NOTE — Discharge Instructions (Signed)

## 2015-10-18 NOTE — Anesthesia Procedure Notes (Addendum)
Procedure Name: LMA Insertion Date/Time: 10/18/2015 9:29 AM Performed by: Jessica PriestBEESON, Kristan Votta C Pre-anesthesia Checklist: Patient identified, Emergency Drugs available, Suction available and Patient being monitored Patient Re-evaluated:Patient Re-evaluated prior to inductionOxygen Delivery Method: Circle System Utilized Preoxygenation: Pre-oxygenation with 100% oxygen Intubation Type: IV induction Ventilation: Mask ventilation without difficulty LMA: LMA inserted LMA Size: 5.0 Number of attempts: 1 Airway Equipment and Method: Bite block Placement Confirmation: positive ETCO2 Tube secured with: Tape Dental Injury: Teeth and Oropharynx as per pre-operative assessment  Comments: Gray shoulder ramp, blankets with soft pillow and yellow gel head rest used for adequate airway management - Pre 02 well prior to induction - SOB with exsertion from moving from stretcher to bed . HOB elevated w/ 02 - assistance with moving

## 2015-10-18 NOTE — Transfer of Care (Signed)
Immediate Anesthesia Transfer of Care Note  Patient: [name]  Procedure(s) Performed: Procedure(s) (LRB): CYSTOSCOPY/URETEROSCOPY/HOLMIUM LASER/STENT PLACEMENT (Left)  Patient Location: PACU  Anesthesia Type: General  Level of Consciousness: awake, sedated, patient cooperative and responds to stimulation  Airway & Oxygen Therapy: Patient Spontanous Breathing and Patient connected to face mask oxygen  Post-op Assessment: Report given to PACU RN, Post -op Vital signs reviewed and stable and Patient moving all extremities  Post vital signs: Reviewed and stable  Complications: No apparent anesthesia complications

## 2015-10-18 NOTE — Anesthesia Postprocedure Evaluation (Signed)
Anesthesia Post Note  Patient: [name]  Procedure(s) Performed: Procedure(s) (LRB): CYSTOSCOPY/URETEROSCOPY/HOLMIUM LASER/STENT PLACEMENT (Left)  Patient location during evaluation: PACU Anesthesia Type: General Level of consciousness: awake and alert Pain management: pain level controlled Vital Signs Assessment: post-procedure vital signs reviewed and stable Respiratory status: spontaneous breathing, nonlabored ventilation, respiratory function stable and patient connected to nasal cannula oxygen Cardiovascular status: blood pressure returned to baseline and stable Postop Assessment: no signs of nausea or vomiting Anesthetic complications: no    Last Vitals:  Filed Vitals:   10/18/15 1050 10/18/15 1100  BP:  123/72  Pulse: 90 92  Temp:    Resp: 18 17    Last Pain:  Filed Vitals:   10/18/15 1102  PainSc: 5                  Marquest Gunkel L

## 2015-10-18 NOTE — H&P (Signed)
Mr. Broski is a 75 year old male with left ureteral calculi.     Bilateral renal calculi: He was noted to have bilateral, nonobstructing renal calculi by CT scan on 08/27/15. At that time distal left ureteral calculi were present and causing hydronephrosis.    Due to obstructing left ureteral stones and very high fever with symptoms suggesting urosepsis he underwent emergent left ureteral stent placement by Dr. Berneice Heinrich and because of the presence of right ureteral calculi he placed a right ureteral stent as well. The patient did well and in fact all cultures including multiple blood cultures and urine cultures returned negative. He has been tolerating his stents. He has however been having gross hematuria since they placed him on Eliquis in the hospital.   Review of Systems Genitourinary, constitutional, skin, eye, otolaryngeal, hematologic/lymphatic, cardiovascular, pulmonary, endocrine, musculoskeletal, gastrointestinal, neurological and psychiatric system(s) were reviewed and pertinent findings if present are noted and are otherwise negative.  Genitourinary: urinary frequency, dysuria, nocturia and incontinence.  Constitutional: night sweats.  Musculoskeletal: back pain and joint pain.  Neurological: headache.  Psychiatric: depression.    Vitals Vital Signs  Height: 5 ft 5 in Weight: 283 lb  BMI Calculated: 47.09 BSA Calculated: 2.29 Blood Pressure: 137 / 83 Heart Rate: 101  Physical Exam Constitutional: Well nourished and well developed . No acute distress.   ENT:. The ears and nose are normal in appearance.   Neck: The appearance of the neck is normal and no neck mass is present.   Pulmonary: No respiratory distress and normal respiratory rhythm and effort.   Cardiovascular: Heart rate and rhythm are normal . No peripheral edema.   Abdomen: The abdomen is soft and nontender. No masses are palpated. No CVA tenderness. No hernias are palpable. No hepatosplenomegaly noted.    Lymphatics: The femoral and inguinal nodes are not enlarged or tender.   Skin: Normal skin turgor, no visible rash and no visible skin lesions.   Neuro/Psych:. Mood and affect are appropriate.   Results/Data Urine  COLOR BLOODY  APPEARANCE TURBID  SPECIFIC GRAVITY   pH TNP  GLUCOSE TNP  BILIRUBIN TNP  KETONE TNP  BLOOD TNP  PROTEIN TNP  NITRITE TNP  LEUKOCYTE ESTERASE TNP  SQUAMOUS EPITHELIAL/HPF 0-5 HPF WBC 0-5 WBC/HPF RBC PACKED RBC/HPF BACTERIA FEW HPF CRYSTALS NONE SEEN HPF CASTS NONE SEEN LPF Yeast NONE SEEN HPF  KUB: This study reveals bilateral double-J stents in correct position. Orthopedic hardware is seen in his spine. He has right renal calculi that are visible but no definite left renal calculi are visible on this study. Multiple stones are seen within the distal left ureter along the course of the stent. The bony skeleton and bowel gas pattern otherwise are unremarkable.         Assessment Assessed  1. Ureteral calculus, left (N20.1)  We discussed the management of urinary stones. These options include observation, ureteroscopy, shockwave lithotripsy, and PCNL. We discussed which options are relevant to these particular stones. We discussed the natural history of stones as well as the complications of untreated stones and the impact on quality of life without treatment as well as with each of the above listed treatments. We also discussed the efficacy of each treatment in its ability to clear the stone burden. With any of these management options I discussed the signs and symptoms of infection and the need for emergent treatment should these be experienced. For each option we discussed the ability of each procedure to clear the patient of their stone  burden.    For observation I described the risks which include but are not limited to silent renal damage, life-threatening infection, need for emergent surgery, failure to pass stone, and pain.    For  ureteroscopy I described the risks which include heart attack, stroke, pulmonary embolus, death, bleeding, infection, damage to contiguous structures, positioning injury, ureteral stricture, ureteral avulsion, ureteral injury, need for ureteral stent, inability to perform ureteroscopy, need for an interval procedure, inability to clear stone burden, stent discomfort and pain.    For shockwave lithotripsy I described the risks which include arrhythmia, kidney contusion, kidney hemorrhage, need for transfusion, long-term risk of diabetes or hypertension, back discomfort, flank ecchymosis, flank abrasion, inability to break up stone, inability to pass stone fragments, Steinstrasse, infection associated with obstructing stones, need for different surgical procedure and possible need for repeat shockwave lithotripsy.      I removed his right ureteral stent today and discussed with him that this may decrease the amount of hematuria he has been seen but I told him that he still may see some hematuria until his stones have been treated and his left ureteral stent has been removed.    His urine is clear but I will reculture his urine to be sure it is sterile prior to surgery.   Plan  1. Culture urine - his cultures returned negative.  2. He is scheduled for left ureteroscopy and laser lithotripsy of his left distal ureteral stones.   3. He will need to come off of his Eliquis for his surgery.

## 2015-10-18 NOTE — Anesthesia Preprocedure Evaluation (Addendum)
Anesthesia Evaluation  Patient identified by MRN, date of birth, ID band Patient awake    Reviewed: Allergy & Precautions, H&P , NPO status , Patient's Chart, lab work & pertinent test results  Airway Mallampati: III  TM Distance: >3 FB Neck ROM: full    Dental  (+) Dental Advisory Given, Poor Dentition, Edentulous Upper Upper teeth are gone to gum line and rotten inside gum.  Never extracted.  Other teeth terrible.:   Pulmonary sleep apnea ,    Pulmonary exam normal breath sounds clear to auscultation       Cardiovascular hypertension, Pt. on medications Normal cardiovascular exam+ dysrhythmias Atrial Fibrillation  Rhythm:regular Rate:Normal  ECG - AF   Neuro/Psych negative neurological ROS  negative psych ROS   GI/Hepatic negative GI ROS, (+) Hepatitis -, B  Endo/Other  negative endocrine ROSMorbid obesity  Renal/GU negative Renal ROS  negative genitourinary   Musculoskeletal   Abdominal (+) + obese,   Peds  Hematology negative hematology ROS (+)   Anesthesia Other Findings   Reproductive/Obstetrics negative OB ROS                           Anesthesia Physical Anesthesia Plan  ASA: III  Anesthesia Plan: General   Post-op Pain Management:    Induction: Intravenous  Airway Management Planned: LMA  Additional Equipment:   Intra-op Plan:   Post-operative Plan:   Informed Consent: I have reviewed the patients History and Physical, chart, labs and discussed the procedure including the risks, benefits and alternatives for the proposed anesthesia with the patient or authorized representative who has indicated his/her understanding and acceptance.   Dental Advisory Given  Plan Discussed with: CRNA and Surgeon  Anesthesia Plan Comments:         Anesthesia Quick Evaluation

## 2015-10-19 ENCOUNTER — Encounter (HOSPITAL_BASED_OUTPATIENT_CLINIC_OR_DEPARTMENT_OTHER): Payer: Self-pay | Admitting: Urology

## 2018-02-22 ENCOUNTER — Other Ambulatory Visit: Payer: Self-pay | Admitting: Neurosurgery

## 2018-02-22 DIAGNOSIS — M4804 Spinal stenosis, thoracic region: Secondary | ICD-10-CM

## 2018-02-25 DIAGNOSIS — I712 Thoracic aortic aneurysm, without rupture, unspecified: Secondary | ICD-10-CM

## 2018-02-25 HISTORY — DX: Thoracic aortic aneurysm, without rupture: I71.2

## 2018-02-25 HISTORY — DX: Thoracic aortic aneurysm, without rupture, unspecified: I71.20

## 2018-03-05 ENCOUNTER — Ambulatory Visit
Admission: RE | Admit: 2018-03-05 | Discharge: 2018-03-05 | Disposition: A | Discharge status: Home / Self Care | Payer: Medicare HMO | Source: Ambulatory Visit | Attending: Neurosurgery | Admitting: Neurosurgery

## 2018-03-05 DIAGNOSIS — M4804 Spinal stenosis, thoracic region: Secondary | ICD-10-CM

## 2018-04-22 ENCOUNTER — Other Ambulatory Visit: Payer: Self-pay | Admitting: Neurosurgery

## 2018-05-24 NOTE — Progress Notes (Addendum)
PCP: Hope Pigeonebecca Everly, DO  Cardiologist: has an appointment to see Dr. Meda [facility]linefelterobert [facility]  EKG: 05/27/18 in EPIC  Stress test: pt denies  ECHO: 2017 in EPIC  Cardiac Cath: pt denies  Chest x-ray: CTA chest in Care Everywhere 02/25/18 at [facility]

## 2018-05-24 NOTE — Pre-Procedure Instructions (Signed)
[name]  05/24/2018      [facility] - [place] - [address] [place] [address] Phone: [phone] Fax: [phone]    Your procedure is scheduled on June 03, 2018  Report to [facility] Admitting at 600 AM.  Call this number if you have problems the morning of surgery:  [phone]   Remember:  Do not eat or drink after midnight.    Take these medicines the morning of surgery with A SIP OF WATER  Duloxetine (cymbalta) Hydrocodone-acetaminophen (norco)-if needed for pain Tylenol-if needed Tamsulosin (Flomax) Tobramycin eye drops  Follow your surgeon's instructions on when to hold/resume aspirin.  If no instructions were given call the office to determine how they would like to you take aspirin  7 days prior to surgery STOP taking any celebrex, Aleve, Naproxen, Ibuprofen, Motrin, Advil, Goody's, BC's, all herbal medications, fish oil, and all vitamins    Do not wear jewelry  Do not wear lotions, powders, or colognes, or deodorant.  Men may shave face and neck.  Do not bring valuables to the hospital.  [facility] is not responsible for any belongings or valuables.  Contacts, dentures or bridgework may not be worn into surgery.  Leave your suitcase in the car.  After surgery it may be brought to your room.  For patients admitted to the hospital, discharge time will be determined by your treatment team.  Patients discharged the day of surgery will not be allowed to drive home.    [facility]- Preparing For Surgery  Before surgery, you can play an important role. Because skin is not sterile, your skin needs to be as free of germs as possible. You can reduce the number of germs on your skin by washing with CHG (chlorahexidine gluconate) Soap before surgery.  CHG is an antiseptic cleaner which kills germs and bonds with the skin to continue killing germs even after washing.    Oral Hygiene is also important  to reduce your risk of infection.  Remember - BRUSH YOUR TEETH THE MORNING OF SURGERY WITH YOUR REGULAR TOOTHPASTE  Please do not use if you have an allergy to CHG or antibacterial soaps. If your skin becomes reddened/irritated stop using the CHG.  Do not shave (including legs and underarms) for at least 48 hours prior to first CHG shower. It is OK to shave your face.  Please follow these instructions carefully.   1. Shower the NIGHT BEFORE SURGERY and the MORNING OF SURGERY with CHG.   2. If you chose to wash your hair, wash your hair first as usual with your normal shampoo.  3. After you shampoo, rinse your hair and body thoroughly to remove the shampoo.  4. Use CHG as you would any other liquid soap. You can apply CHG directly to the skin and wash gently with a scrungie or a clean washcloth.   5. Apply the CHG Soap to your body ONLY FROM THE NECK DOWN.  Do not use on open wounds or open sores. Avoid contact with your eyes, ears, mouth and genitals (private parts). Wash Face and genitals (private parts)  with your normal soap.  6. Wash thoroughly, paying special attention to the area where your surgery will be performed.  7. Thoroughly rinse your body with warm water from the neck down.  8. DO NOT shower/wash with your normal soap after using and rinsing off the CHG Soap.  9. Pat yourself dry  with a CLEAN TOWEL.  10. Wear CLEAN PAJAMAS to bed the night before surgery, wear comfortable clothes the morning of surgery  11. Place CLEAN SHEETS on your bed the night of your first shower and DO NOT SLEEP WITH PETS.  Day of Surgery:  Do not apply any deodorants/lotions.  Please wear clean clothes to the hospital/surgery center.   Remember to brush your teeth WITH YOUR REGULAR TOOTHPASTE.

## 2018-05-27 ENCOUNTER — Other Ambulatory Visit: Payer: Self-pay

## 2018-05-27 ENCOUNTER — Encounter (HOSPITAL_COMMUNITY): Payer: Self-pay

## 2018-05-27 ENCOUNTER — Encounter (HOSPITAL_COMMUNITY)
Admission: RE | Admit: 2018-05-27 | Discharge: 2018-05-27 | Disposition: A | Discharge status: Home / Self Care | Payer: Medicare HMO | Source: Ambulatory Visit | Attending: Neurosurgery | Admitting: Neurosurgery

## 2018-05-27 DIAGNOSIS — Z01818 Encounter for other preprocedural examination: Secondary | ICD-10-CM | POA: Diagnosis present

## 2018-05-27 LAB — BASIC METABOLIC PANEL
ANION GAP: 12 (ref 5–15)
BUN: 28 mg/dL — ABNORMAL HIGH (ref 8–23)
CALCIUM: 9.1 mg/dL (ref 8.9–10.3)
CO2: 25 mmol/L (ref 22–32)
Chloride: 99 mmol/L (ref 98–111)
Creatinine, Ser: 0.88 mg/dL (ref 0.61–1.24)
GFR calc Af Amer: >60 mL/min (ref >60)
GFR calc non Af Amer: >60 mL/min (ref >60)
Glucose, Bld: 184 mg/dL — ABNORMAL HIGH (ref 70–99)
Potassium: 4 mmol/L (ref 3.5–5.1)
Sodium: 136 mmol/L (ref 135–145)

## 2018-05-27 LAB — SURGICAL PCR SCREEN
MRSA, PCR: NEGATIVE
Staphylococcus aureus: POSITIVE — AB

## 2018-05-27 LAB — CBC WITH DIFFERENTIAL/PLATELET
Abs Immature Granulocytes: 0.04 10*3/uL (ref 0.00–0.07)
BASOS ABS: 0 10*3/uL (ref 0.0–0.1)
Basophils Relative: 1 %
EOS PCT: 4 %
Eosinophils Absolute: 0.2 10*3/uL (ref 0.0–0.5)
HCT: 42.8 % (ref 39.0–52.0)
Hemoglobin: 12.7 g/dL — ABNORMAL LOW (ref 13.0–17.0)
Immature Granulocytes: 1 %
Lymphocytes Relative: 18 %
Lymphs Abs: 0.9 10*3/uL (ref 0.7–4.0)
MCH: 28.5 pg (ref 26.0–34.0)
MCHC: 29.7 g/dL — AB (ref 30.0–36.0)
MCV: 96.2 fL (ref 80.0–100.0)
Monocytes Absolute: 0.6 10*3/uL (ref 0.1–1.0)
Monocytes Relative: 11 %
Neutro Abs: 3.4 10*3/uL (ref 1.7–7.7)
Neutrophils Relative %: 65 %
Platelets: 144 10*3/uL — ABNORMAL LOW (ref 150–400)
RBC: 4.45 MIL/uL (ref 4.22–5.81)
RDW: 13.9 % (ref 11.5–15.5)
WBC: 5.2 10*3/uL (ref 4.0–10.5)
nRBC: 0 % (ref 0.0–0.2)

## 2018-05-28 NOTE — Progress Notes (Addendum)
Anesthesia Chart Review:  Case:  [id number] Date/Time:  06/03/18 0745   Procedure:  Laminectomy - T11-T12 (N/A Back)   Anesthesia type:  General   Pre-op diagnosis:  Stenosis   Location:  [facility] ROOM 19 / [facility] OR   Surgeon:  Julio Sicks, MD      DISCUSSION: Patient is a 77 year old male scheduled for the above procedure. 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   Awaiting call back from PCP office regarding status of recommended stress test. He saw CT surgery on 05/28/18 for TAA--note still pending.  ADDENDUM 06/03/18 9:48 AM: Surgery moved to 05/28/18 to allow time for stress test.   - He saw CT surgeon Meda Klinefelter, DO on 05/28/18. Six month follow-up with repeat CT chest to re-evaluate TAA recommended. At it's current size, he did not think his aneurysm should preclude him from undergoing his upcoming stress test or surgery. - He was seen by [facility] Pul[facility]ology on 05/30/18 by Lysle Pearl, PA-C to discuss results of home sleep study. He was previously diagnosed with OSA many yeas ago but could not tolerate CPAP with nasal pillows. Current sleep study, "revealed evidence of severe obstructive sleep apnea with total AHI of 62.2 and significant oxygen desaturations." Patient will start Auto Pap 5-15 cm pressure with nasal mask and heated humidity.  Chart will be left for follow-up stress test results.   ADDENDUM 06/07/18 7:32 AM: Stress test done on 06/06/18 and was non-ischemic, EF 50% (outlined below). EF 55-60% by 01/14/18 echo. If no acute changes then I would anticipate that he can proceed as planned.   VS: BP (!) 111/55   Pulse 87   Temp 36.6 C (Oral)   Resp 16   Ht 5\' 8"  (1.727 m)   Wt (!) 153.3 kg   SpO2 95%   BMI 51.39 kg/m    PROVIDERS: Leola Brazil, DO is listed as PCP. Patient was seen by Annalee Genta, MD on 05/20/18 for preoperative evaluation. Stress  test or stress echo recommended prior to surgery. She also referred him to CT surgery for TAA for future follow-up, and was seen by by Meda Klinefelter, MD on 05/28/18. Dr. Conley Rolls advised he hold ASA for 7 days prior to surgery.  - He saw cardiologist Heide Scales, MD on 09/23/15 for new onset afib/PAF in the setting of acute illness.  LVEF normal at that time. Anticoagulation not recommended then given hematuria.   LABS: Labs reviewed: Acceptable for surgery. Non-fasting glucose 184 (no known history of DM). Yearly normal LFTs since 01/2014, last on 01/07/18 [facility] Care Everywhere).  (all labs ordered are listed, but only abnormal results are displayed)  Labs Reviewed  SURGICAL PCR SCREEN - Abnormal; Notable for the following components:      Result Value   Staphylococcus aureus POSITIVE (*)    All other components within normal limits  BASIC METABOLIC PANEL - Abnormal; Notable for the following components:   Glucose, Bld 184 (*)    BUN 28 (*)    All other components within normal limits  CBC WITH DIFFERENTIAL/PLATELET - Abnormal; Notable for the following components:   Hemoglobin 12.7 (*)    MCHC 29.7 (*)    Platelets 144 (*)    All other components within normal limits    IMAGES: MRI T-spine 03/05/18: IMPRESSION: 1. 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 Disc bulging and facet hypertrophy at this level causes cord compression without signal abnormality. Left more than right foraminal impingement. 2. Mild hydromyelia above the cord compression.  CTA chest 02/25/18 [facility] Care Everywhere): IMPRESSION: 1. Aneurysmal disease of the ascending thoracic aorta with maximal diameter of 4.6 cm. The aortic root is not dilated and measures 3.9 cm. The rest of the thoracic aorta is of normal caliber. Ascending thoracic aortic aneurysm. Recommend semi-annual imaging followup by CTA or MRA and referral to cardiothoracic surgery if not  already obtained. This recommendation follows 2010 ACCF/AHA/AATS/ACR/ASA/SCA/SCAI/SIR/STS/SVM Guidelines for the Diagnosis and Management of Patients With Thoracic Aortic Disease. Circulation. 2010; 121: [id number] 2. Moderate cardiac enlargement with evidence of right heart failure. Evidence of probable pulmonary venous hypertension and pulmonary arterial hypertension. 3. Coronary atherosclerosis with mild amount of calcified plaque seen in the distribution of the LAD. 4. Incidental left thyroid nodule measuring approximately 1.4 cm in diameter. Recommend correlation with thyroid function. Consider evaluation with thyroid ultrasound.   EKG: 05/27/18: SR with sinus arrhythmia and occasional PVCs.   CV: Nuclear stress test 06/06/18 [facility] Care Everywhere): IMPRESSION: 1. No reversible ischemia or infarction. 2. Normal left ventricular wall motion. 3. Left ventricular ejection fraction 50% 4. Non invasive risk stratification*: Low  BLE venous U/S 02/04/18 [facility] Care Everywhere): Conclusions  Summary  There is no evidence of DVT or superficial vein thrombophlebitis in the  lower extremities bilaterally.  No significant venous reflux was seen bilateraly.  Note: Isolated calf DVT may not be detected by this study.  Echo 01/14/18 [facility] Care Everywhere): Summary Left ventricle cavity size is normal. Normal left ventricular systolic function Ejection fraction is visually estimated at 55-60% Diastolic function appears indeterminate. Mild concentric left ventricular hypertrophy Mild Dilation of the aortic root (4.26 cm) Moderate to severe dilation of the Ascending Aorta (4.97 cm) The IVC is not well visualized. Suggest CTA to eval Aortic aneurysm and get CTS consult if aneurysm is confirmed at this size. (4.6 cm ascending TAA by 02/25/18 CTA.)  14 day telemetry monitor 11/03/15 [facility]): Results not viewable in [facility].  Abdominal aorta U/S 03/05/14 [facility] Care  Everywhere): IMPRESSION: No evidence of abdominal aortic aneurysm. Minimal dilatation of the suprarenal abdominal aorta measuring 2.8 cm. Consider followup ultrasound of 5 years. This recommendation follows ACR consensus guidelines: White Paper of the ACR Incidental Findings Committee II on Vascular Findings. J Am Coll Radiol 671 135 [id number]:10:789-794.   Past Medical History:  Diagnosis Date  . Aortic aneurysm (HCC) 02/25/2018  . Arthritis    back,joints,neck-limited mobility,flexion-  . Constipation due to pain medication   . Depression   . Dysrhythmia 08/31/2015   AF new onset    /NSR noted 09/23/15  . Headache(784.0)   . Hepatitis   . Hepatitis B    "years ago"  . Hypertension   . Lumbar neuralgia   . Sleep apnea 1990's  . Urinary frequency     Past Surgical History:  Procedure Laterality Date  . APPENDECTOMY    . BACK SURGERY     7 lumbar surgeries  . CYSTOSCOPY WITH RETROGRADE PYELOGRAM, URETEROSCOPY AND STENT PLACEMENT Left 08/27/2015   Procedure: CYSTOSCOPY WITH BILATERAL RETROGRADE PYELOGRAM, URETEROSCOPY AND BILATERAL STENT PLACEMENT;  Surgeon: Sebastian Acheheodore Manny, MD;  Location: [facility];  Service: Urology;  Laterality: Left;  . CYSTOSCOPY/URETEROSCOPY/HOLMIUM LASER/STENT PLACEMENT Left 10/18/2015   Procedure: CYSTOSCOPY/URETEROSCOPY/HOLMIUM LASER/STENT PLACEMENT;  Surgeon: Ihor GullyMark Ottelin, MD;  [facility];  Service: Urology;  Laterality: Left;  .  JOINT REPLACEMENT Right 1990's   Rt shoulder  . JOINT REPLACEMENT Right 2013   knee    MEDICATIONS: . furosemide (LASIX) 20 MG tablet  . acetaminophen (TYLENOL) 650 MG CR tablet  . aspirin EC 81 MG tablet  . atorvastatin (LIPITOR) 20 MG tablet  . celecoxib (CELEBREX) 200 MG capsule  . Cholecalciferol (VITAMIN D3) 125 MCG (5000 UT) CAPS  . DULoxetine (CYMBALTA) 30 MG capsule  . DULoxetine (CYMBALTA) 60 MG capsule  . HYDROcodone-acetaminophen (NORCO) 10-325 MG tablet  . losartan (COZAAR) 50 MG tablet  .  mirtazapine (REMERON) 15 MG tablet  . Multiple Vitamins-Minerals (PRESERVISION AREDS 2 PO)  . tamsulosin (FLOMAX) 0.4 MG CAPS capsule  . tobramycin (TOBREX) 0.3 % ophthalmic solution   No current facility-administered medications for this encounter.     Velna Ochs [facility] Short Stay Center/Anesthesiology Phone [phone] 05/28/2018 11:54 AM

## 2018-06-06 MED ORDER — DEXTROSE 5 % IV SOLN
3.0000 g | INTRAVENOUS | Status: AC
  Administered 2018-06-07: 3 g via INTRAVENOUS
  Filled 2018-06-06: qty 3

## 2018-06-07 ENCOUNTER — Inpatient Hospital Stay (HOSPITAL_COMMUNITY): Payer: Medicare HMO

## 2018-06-07 ENCOUNTER — Inpatient Hospital Stay (HOSPITAL_COMMUNITY)
Admission: RE | Admit: 2018-06-07 | Discharge: 2018-06-28 | DRG: 518 | Disposition: A | Discharge status: Skilled Nursing Facility | Payer: Medicare HMO | Attending: Neurosurgery | Admitting: Neurosurgery

## 2018-06-07 ENCOUNTER — Encounter (HOSPITAL_COMMUNITY)
Admission: RE | Disposition: A | Discharge status: Skilled Nursing Facility | Payer: Self-pay | Source: Home / Self Care | Attending: Neurosurgery

## 2018-06-07 ENCOUNTER — Inpatient Hospital Stay (HOSPITAL_COMMUNITY): Payer: Medicare HMO | Admitting: Vascular Surgery

## 2018-06-07 DIAGNOSIS — K219 Gastro-esophageal reflux disease without esophagitis: Secondary | ICD-10-CM | POA: Diagnosis present

## 2018-06-07 DIAGNOSIS — I480 Paroxysmal atrial fibrillation: Secondary | ICD-10-CM | POA: Diagnosis present

## 2018-06-07 DIAGNOSIS — M4804 Spinal stenosis, thoracic region: Principal | ICD-10-CM | POA: Diagnosis present

## 2018-06-07 DIAGNOSIS — I493 Ventricular premature depolarization: Secondary | ICD-10-CM | POA: Diagnosis present

## 2018-06-07 DIAGNOSIS — Z86711 Personal history of pulmonary embolism: Secondary | ICD-10-CM

## 2018-06-07 DIAGNOSIS — I4819 Other persistent atrial fibrillation: Secondary | ICD-10-CM | POA: Diagnosis present

## 2018-06-07 DIAGNOSIS — I5043 Acute on chronic combined systolic (congestive) and diastolic (congestive) heart failure: Secondary | ICD-10-CM | POA: Diagnosis present

## 2018-06-07 DIAGNOSIS — I110 Hypertensive heart disease with heart failure: Secondary | ICD-10-CM | POA: Diagnosis present

## 2018-06-07 DIAGNOSIS — J9622 Acute and chronic respiratory failure with hypercapnia: Secondary | ICD-10-CM | POA: Diagnosis not present

## 2018-06-07 DIAGNOSIS — Z9119 Patient's noncompliance with other medical treatment and regimen: Secondary | ICD-10-CM

## 2018-06-07 DIAGNOSIS — J9600 Acute respiratory failure, unspecified whether with hypoxia or hypercapnia: Secondary | ICD-10-CM

## 2018-06-07 DIAGNOSIS — R1310 Dysphagia, unspecified: Secondary | ICD-10-CM | POA: Diagnosis present

## 2018-06-07 DIAGNOSIS — N400 Enlarged prostate without lower urinary tract symptoms: Secondary | ICD-10-CM | POA: Diagnosis present

## 2018-06-07 DIAGNOSIS — Z6841 Body Mass Index (BMI) 40.0 and over, adult: Secondary | ICD-10-CM | POA: Diagnosis not present

## 2018-06-07 DIAGNOSIS — Z79899 Other long term (current) drug therapy: Secondary | ICD-10-CM

## 2018-06-07 DIAGNOSIS — I4891 Unspecified atrial fibrillation: Secondary | ICD-10-CM | POA: Diagnosis not present

## 2018-06-07 DIAGNOSIS — Z96651 Presence of right artificial knee joint: Secondary | ICD-10-CM | POA: Diagnosis present

## 2018-06-07 DIAGNOSIS — I959 Hypotension, unspecified: Secondary | ICD-10-CM | POA: Diagnosis not present

## 2018-06-07 DIAGNOSIS — R296 Repeated falls: Secondary | ICD-10-CM | POA: Diagnosis present

## 2018-06-07 DIAGNOSIS — G8929 Other chronic pain: Secondary | ICD-10-CM | POA: Diagnosis present

## 2018-06-07 DIAGNOSIS — Z888 Allergy status to other drugs, medicaments and biological substances status: Secondary | ICD-10-CM

## 2018-06-07 DIAGNOSIS — Z981 Arthrodesis status: Secondary | ICD-10-CM

## 2018-06-07 DIAGNOSIS — J9601 Acute respiratory failure with hypoxia: Secondary | ICD-10-CM | POA: Diagnosis not present

## 2018-06-07 DIAGNOSIS — R7989 Other specified abnormal findings of blood chemistry: Secondary | ICD-10-CM | POA: Diagnosis not present

## 2018-06-07 DIAGNOSIS — Z96611 Presence of right artificial shoulder joint: Secondary | ICD-10-CM | POA: Diagnosis present

## 2018-06-07 DIAGNOSIS — Z8249 Family history of ischemic heart disease and other diseases of the circulatory system: Secondary | ICD-10-CM

## 2018-06-07 DIAGNOSIS — E785 Hyperlipidemia, unspecified: Secondary | ICD-10-CM | POA: Diagnosis present

## 2018-06-07 DIAGNOSIS — Y658 Other specified misadventures during surgical and medical care: Secondary | ICD-10-CM | POA: Diagnosis not present

## 2018-06-07 DIAGNOSIS — R2681 Unsteadiness on feet: Secondary | ICD-10-CM | POA: Diagnosis present

## 2018-06-07 DIAGNOSIS — J9621 Acute and chronic respiratory failure with hypoxia: Secondary | ICD-10-CM | POA: Diagnosis not present

## 2018-06-07 DIAGNOSIS — G4733 Obstructive sleep apnea (adult) (pediatric): Secondary | ICD-10-CM | POA: Diagnosis present

## 2018-06-07 DIAGNOSIS — G9341 Metabolic encephalopathy: Secondary | ICD-10-CM | POA: Diagnosis not present

## 2018-06-07 DIAGNOSIS — G9741 Accidental puncture or laceration of dura during a procedure: Secondary | ICD-10-CM | POA: Diagnosis not present

## 2018-06-07 DIAGNOSIS — I50810 Right heart failure, unspecified: Secondary | ICD-10-CM | POA: Diagnosis present

## 2018-06-07 DIAGNOSIS — R0902 Hypoxemia: Secondary | ICD-10-CM

## 2018-06-07 DIAGNOSIS — G992 Myelopathy in diseases classified elsewhere: Secondary | ICD-10-CM | POA: Diagnosis present

## 2018-06-07 DIAGNOSIS — I10 Essential (primary) hypertension: Secondary | ICD-10-CM | POA: Diagnosis not present

## 2018-06-07 DIAGNOSIS — R739 Hyperglycemia, unspecified: Secondary | ICD-10-CM | POA: Diagnosis present

## 2018-06-07 DIAGNOSIS — Z720 Tobacco use: Secondary | ICD-10-CM

## 2018-06-07 DIAGNOSIS — I272 Pulmonary hypertension, unspecified: Secondary | ICD-10-CM | POA: Diagnosis present

## 2018-06-07 DIAGNOSIS — R51 Headache: Secondary | ICD-10-CM | POA: Diagnosis present

## 2018-06-07 DIAGNOSIS — Y92230 Patient room in hospital as the place of occurrence of the external cause: Secondary | ICD-10-CM | POA: Diagnosis not present

## 2018-06-07 DIAGNOSIS — D638 Anemia in other chronic diseases classified elsewhere: Secondary | ICD-10-CM | POA: Diagnosis present

## 2018-06-07 DIAGNOSIS — J9690 Respiratory failure, unspecified, unspecified whether with hypoxia or hypercapnia: Secondary | ICD-10-CM

## 2018-06-07 DIAGNOSIS — R1311 Dysphagia, oral phase: Secondary | ICD-10-CM | POA: Diagnosis not present

## 2018-06-07 DIAGNOSIS — J9602 Acute respiratory failure with hypercapnia: Secondary | ICD-10-CM | POA: Diagnosis not present

## 2018-06-07 DIAGNOSIS — M2578 Osteophyte, vertebrae: Secondary | ICD-10-CM | POA: Diagnosis present

## 2018-06-07 DIAGNOSIS — E876 Hypokalemia: Secondary | ICD-10-CM | POA: Diagnosis not present

## 2018-06-07 DIAGNOSIS — Y92234 Operating room of hospital as the place of occurrence of the external cause: Secondary | ICD-10-CM | POA: Diagnosis not present

## 2018-06-07 DIAGNOSIS — Z87442 Personal history of urinary calculi: Secondary | ICD-10-CM

## 2018-06-07 DIAGNOSIS — N179 Acute kidney failure, unspecified: Secondary | ICD-10-CM | POA: Diagnosis not present

## 2018-06-07 DIAGNOSIS — I2510 Atherosclerotic heart disease of native coronary artery without angina pectoris: Secondary | ICD-10-CM | POA: Diagnosis present

## 2018-06-07 DIAGNOSIS — Z978 Presence of other specified devices: Secondary | ICD-10-CM

## 2018-06-07 DIAGNOSIS — F329 Major depressive disorder, single episode, unspecified: Secondary | ICD-10-CM | POA: Diagnosis present

## 2018-06-07 DIAGNOSIS — E870 Hyperosmolality and hypernatremia: Secondary | ICD-10-CM | POA: Diagnosis not present

## 2018-06-07 DIAGNOSIS — I712 Thoracic aortic aneurysm, without rupture: Secondary | ICD-10-CM | POA: Diagnosis present

## 2018-06-07 DIAGNOSIS — I5030 Unspecified diastolic (congestive) heart failure: Secondary | ICD-10-CM | POA: Diagnosis not present

## 2018-06-07 DIAGNOSIS — I5031 Acute diastolic (congestive) heart failure: Secondary | ICD-10-CM | POA: Diagnosis not present

## 2018-06-07 DIAGNOSIS — T40605A Adverse effect of unspecified narcotics, initial encounter: Secondary | ICD-10-CM | POA: Diagnosis not present

## 2018-06-07 DIAGNOSIS — E875 Hyperkalemia: Secondary | ICD-10-CM | POA: Diagnosis not present

## 2018-06-07 DIAGNOSIS — Z7982 Long term (current) use of aspirin: Secondary | ICD-10-CM

## 2018-06-07 HISTORY — DX: Atherosclerotic heart disease of native coronary artery without angina pectoris: I25.10

## 2018-06-07 HISTORY — DX: Other chronic pain: G89.29

## 2018-06-07 HISTORY — DX: Anxiety disorder, unspecified: F41.9

## 2018-06-07 HISTORY — DX: Personal history of pulmonary embolism: Z86.711

## 2018-06-07 HISTORY — DX: Paroxysmal atrial fibrillation: I48.0

## 2018-06-07 HISTORY — DX: Personal history of urinary calculi: Z87.442

## 2018-06-07 HISTORY — DX: Gastro-esophageal reflux disease without esophagitis: K21.9

## 2018-06-07 HISTORY — DX: Personal history of other medical treatment: Z92.89

## 2018-06-07 HISTORY — DX: Carpal tunnel syndrome, unspecified upper limb: G56.00

## 2018-06-07 HISTORY — DX: Thoracic aortic aneurysm, without rupture: I71.2

## 2018-06-07 HISTORY — DX: Hypoxemia: R09.02

## 2018-06-07 HISTORY — PX: LUMBAR LAMINECTOMY/DECOMPRESSION MICRODISCECTOMY: SHX5026

## 2018-06-07 HISTORY — DX: Benign prostatic hyperplasia without lower urinary tract symptoms: N40.0

## 2018-06-07 LAB — URINALYSIS, ROUTINE W REFLEX MICROSCOPIC
Bilirubin Urine: NEGATIVE
GLUCOSE, UA: 100 mg/dL — AB
Ketones, ur: NEGATIVE mg/dL
Leukocytes, UA: NEGATIVE
Nitrite: NEGATIVE
PH: 5.5 (ref 5.0–8.0)
Protein, ur: NEGATIVE mg/dL
SPECIFIC GRAVITY, URINE: 1.025 (ref 1.005–1.030)

## 2018-06-07 LAB — BLOOD GAS, ARTERIAL
Acid-Base Excess: 2.9 mmol/L — ABNORMAL HIGH (ref 0.0–2.0)
Bicarbonate: 28.7 mmol/L — ABNORMAL HIGH (ref 20.0–28.0)
DRAWN BY: 418751
FIO2: 100
MECHVT: 540 mL
O2 Saturation: 98.1 %
PEEP: 5 cmH2O
Patient temperature: 98.6
RATE: 15 resp/min
pCO2 arterial: 59.6 mmHg — ABNORMAL HIGH (ref 32.0–48.0)
pH, Arterial: 7.304 — ABNORMAL LOW (ref 7.350–7.450)
pO2, Arterial: 109 mmHg — ABNORMAL HIGH (ref 83.0–108.0)

## 2018-06-07 LAB — HEPATIC FUNCTION PANEL
ALBUMIN: 3.3 g/dL — AB (ref 3.5–5.0)
ALT: 22 U/L (ref 0–44)
AST: 33 U/L (ref 15–41)
Alkaline Phosphatase: 66 U/L (ref 38–126)
Bilirubin, Direct: 0.2 mg/dL (ref 0.0–0.2)
Indirect Bilirubin: 0.5 mg/dL (ref 0.3–0.9)
Total Bilirubin: 0.7 mg/dL (ref 0.3–1.2)
Total Protein: 6.7 g/dL (ref 6.5–8.1)

## 2018-06-07 LAB — GLUCOSE, CAPILLARY: Glucose-Capillary: 204 mg/dL — ABNORMAL HIGH (ref 70–99)

## 2018-06-07 LAB — URINALYSIS, MICROSCOPIC (REFLEX)

## 2018-06-07 LAB — BASIC METABOLIC PANEL
Anion gap: 17 — ABNORMAL HIGH (ref 5–15)
BUN: 20 mg/dL (ref 8–23)
CALCIUM: 8.9 mg/dL (ref 8.9–10.3)
CO2: 24 mmol/L (ref 22–32)
Chloride: 100 mmol/L (ref 98–111)
Creatinine, Ser: 0.91 mg/dL (ref 0.61–1.24)
GFR calc Af Amer: >60 mL/min (ref >60)
GFR calc non Af Amer: >60 mL/min (ref >60)
Glucose, Bld: 223 mg/dL — ABNORMAL HIGH (ref 70–99)
Potassium: 4.2 mmol/L (ref 3.5–5.1)
Sodium: 141 mmol/L (ref 135–145)

## 2018-06-07 LAB — TROPONIN I: Troponin I: <0.03 ng/mL (ref <0.03)

## 2018-06-07 LAB — PROCALCITONIN: Procalcitonin: <0.1 ng/mL

## 2018-06-07 LAB — LACTIC ACID, PLASMA: LACTIC ACID, VENOUS: 2.6 mmol/L — AB (ref 0.5–1.9)

## 2018-06-07 SURGERY — LUMBAR LAMINECTOMY/DECOMPRESSION MICRODISCECTOMY 1 LEVEL
Anesthesia: General | Site: Back

## 2018-06-07 MED ORDER — ROCURONIUM BROMIDE 50 MG/5ML IV SOSY
PREFILLED_SYRINGE | INTRAVENOUS | Status: AC
  Filled 2018-06-07: qty 5

## 2018-06-07 MED ORDER — VANCOMYCIN HCL 1000 MG IV SOLR
INTRAVENOUS | Status: AC
  Filled 2018-06-07: qty 1000

## 2018-06-07 MED ORDER — MENTHOL 3 MG MT LOZG: 1.0000 | LOZENGE | OROMUCOSAL | Status: DC | PRN

## 2018-06-07 MED ORDER — PROPOFOL 1000 MG/100ML IV EMUL
INTRAVENOUS | Status: AC
  Administered 2018-06-07: 30 ug/kg/min via INTRAVENOUS
  Filled 2018-06-07: qty 100

## 2018-06-07 MED ORDER — ASPIRIN EC 81 MG PO TBEC: 81.0000 mg | DELAYED_RELEASE_TABLET | Freq: Every day | ORAL | Status: DC

## 2018-06-07 MED ORDER — LIDOCAINE 2% (20 MG/ML) 5 ML SYRINGE
INTRAMUSCULAR | Status: AC
  Filled 2018-06-07: qty 5

## 2018-06-07 MED ORDER — LACTATED RINGERS IV SOLN
INTRAVENOUS | Status: DC
  Administered 2018-06-07 – 2018-06-09 (×4): via INTRAVENOUS
  Administered 2018-06-10: 1000 mL via INTRAVENOUS

## 2018-06-07 MED ORDER — PHENOL 1.4 % MT LIQD: 1.0000 | OROMUCOSAL | Status: DC | PRN

## 2018-06-07 MED ORDER — SODIUM CHLORIDE 0.9% FLUSH
3.0000 mL | Freq: Two times a day (BID) | INTRAVENOUS | Status: DC
  Administered 2018-06-07 – 2018-06-18 (×21): 3 mL via INTRAVENOUS

## 2018-06-07 MED ORDER — PROPOFOL 1000 MG/100ML IV EMUL
0.0000 ug/kg/min | INTRAVENOUS | Status: DC
  Administered 2018-06-07 (×2): 30 ug/kg/min via INTRAVENOUS
  Administered 2018-06-08 (×2): 10 ug/kg/min via INTRAVENOUS
  Administered 2018-06-09: 5 ug/kg/min via INTRAVENOUS
  Administered 2018-06-09 – 2018-06-11 (×5): 10 ug/kg/min via INTRAVENOUS
  Filled 2018-06-07 (×9): qty 100

## 2018-06-07 MED ORDER — FENTANYL CITRATE (PF) 100 MCG/2ML IJ SOLN: 50.0000 ug | Freq: Once | INTRAMUSCULAR | Status: DC

## 2018-06-07 MED ORDER — CHLORHEXIDINE GLUCONATE 0.12% ORAL RINSE (MEDLINE KIT)
15.0000 mL | Freq: Two times a day (BID) | OROMUCOSAL | Status: DC
  Administered 2018-06-07 – 2018-06-28 (×40): 15 mL via OROMUCOSAL

## 2018-06-07 MED ORDER — CELECOXIB 200 MG PO CAPS: 200.0000 mg | ORAL_CAPSULE | Freq: Two times a day (BID) | ORAL | Status: DC

## 2018-06-07 MED ORDER — DEXAMETHASONE SODIUM PHOSPHATE 10 MG/ML IJ SOLN
INTRAMUSCULAR | Status: AC
  Filled 2018-06-07: qty 1

## 2018-06-07 MED ORDER — 0.9 % SODIUM CHLORIDE (POUR BTL) OPTIME
TOPICAL | Status: DC | PRN
  Administered 2018-06-07: 1000 mL

## 2018-06-07 MED ORDER — VITAMIN D 25 MCG (1000 UNIT) PO TABS
5000.0000 [IU] | ORAL_TABLET | Freq: Every day | ORAL | Status: DC
  Administered 2018-06-08 – 2018-06-28 (×20): 5000 [IU] via ORAL
  Filled 2018-06-07 (×19): qty 5

## 2018-06-07 MED ORDER — PHENYLEPHRINE HCL-NACL 10-0.9 MG/250ML-% IV SOLN
INTRAVENOUS | Status: AC
  Filled 2018-06-07: qty 250

## 2018-06-07 MED ORDER — HYDROCODONE-ACETAMINOPHEN 10-325 MG PO TABS: 1.0000 | ORAL_TABLET | ORAL | Status: DC

## 2018-06-07 MED ORDER — THROMBIN 5000 UNITS EX SOLR
OROMUCOSAL | Status: DC | PRN
  Administered 2018-06-07: 17:00:00 via TOPICAL

## 2018-06-07 MED ORDER — FUROSEMIDE 20 MG PO TABS: 20.0000 mg | ORAL_TABLET | Freq: Every day | ORAL | Status: DC

## 2018-06-07 MED ORDER — ONDANSETRON HCL 4 MG/2ML IJ SOLN: 4.0000 mg | Freq: Four times a day (QID) | INTRAMUSCULAR | Status: DC | PRN

## 2018-06-07 MED ORDER — HYDROCODONE-ACETAMINOPHEN 10-325 MG PO TABS: 2.0000 | ORAL_TABLET | ORAL | Status: DC | PRN

## 2018-06-07 MED ORDER — SUGAMMADEX SODIUM 200 MG/2ML IV SOLN
INTRAVENOUS | Status: DC | PRN
  Administered 2018-06-07: 400 mg via INTRAVENOUS
  Administered 2018-06-07: 200 mg via INTRAVENOUS

## 2018-06-07 MED ORDER — FENTANYL CITRATE (PF) 250 MCG/5ML IJ SOLN
INTRAMUSCULAR | Status: AC
  Filled 2018-06-07: qty 5

## 2018-06-07 MED ORDER — PHENYLEPHRINE HCL-NACL 10-0.9 MG/250ML-% IV SOLN: 0.0000 ug/min | INTRAVENOUS | Status: DC

## 2018-06-07 MED ORDER — FENTANYL CITRATE (PF) 100 MCG/2ML IJ SOLN
INTRAMUSCULAR | Status: AC
  Administered 2018-06-07: 25 ug via INTRAVENOUS
  Filled 2018-06-07: qty 2

## 2018-06-07 MED ORDER — HEMOSTATIC AGENTS (NO CHARGE) OPTIME
TOPICAL | Status: DC | PRN
  Administered 2018-06-07 (×2): 1 via TOPICAL

## 2018-06-07 MED ORDER — SCOPOLAMINE 1 MG/3DAYS TD PT72
MEDICATED_PATCH | TRANSDERMAL | Status: AC
  Filled 2018-06-07: qty 1

## 2018-06-07 MED ORDER — CEFAZOLIN SODIUM-DEXTROSE 1-4 GM/50ML-% IV SOLN
1.0000 g | Freq: Three times a day (TID) | INTRAVENOUS | Status: AC
  Administered 2018-06-07 – 2018-06-08 (×2): 1 g via INTRAVENOUS
  Filled 2018-06-07 (×2): qty 50

## 2018-06-07 MED ORDER — LIDOCAINE 2% (20 MG/ML) 5 ML SYRINGE
INTRAMUSCULAR | Status: DC | PRN
  Administered 2018-06-07: 80 mg via INTRAVENOUS

## 2018-06-07 MED ORDER — SUCCINYLCHOLINE CHLORIDE 20 MG/ML IJ SOLN: INTRAMUSCULAR | Status: DC | PRN

## 2018-06-07 MED ORDER — FENTANYL CITRATE (PF) 100 MCG/2ML IJ SOLN
INTRAMUSCULAR | Status: DC | PRN
  Administered 2018-06-07: 50 ug via INTRAVENOUS
  Administered 2018-06-07: 25 ug via INTRAVENOUS
  Administered 2018-06-07: 50 ug via INTRAVENOUS
  Administered 2018-06-07: 75 ug via INTRAVENOUS
  Administered 2018-06-07: 25 ug via INTRAVENOUS
  Administered 2018-06-07: 100 ug via INTRAVENOUS
  Administered 2018-06-07: 25 ug via INTRAVENOUS
  Administered 2018-06-07: 100 ug via INTRAVENOUS
  Administered 2018-06-07: 50 ug via INTRAVENOUS

## 2018-06-07 MED ORDER — THROMBIN (RECOMBINANT) 5000 UNITS EX SOLR
CUTANEOUS | Status: AC
  Filled 2018-06-07: qty 10000

## 2018-06-07 MED ORDER — FENTANYL CITRATE (PF) 100 MCG/2ML IJ SOLN
25.0000 ug | INTRAMUSCULAR | Status: DC | PRN
  Administered 2018-06-07 (×4): 25 ug via INTRAVENOUS

## 2018-06-07 MED ORDER — DILTIAZEM HCL 100 MG IV SOLR
5.0000 mg | Freq: Once | INTRAVENOUS | Status: AC
  Administered 2018-06-07: 5 mg via INTRAVENOUS
  Filled 2018-06-07: qty 5

## 2018-06-07 MED ORDER — DULOXETINE HCL 60 MG PO CPEP
90.0000 mg | ORAL_CAPSULE | Freq: Every day | ORAL | Status: DC
  Administered 2018-06-08 – 2018-06-28 (×20): 90 mg via ORAL
  Filled 2018-06-07 (×21): qty 1

## 2018-06-07 MED ORDER — MIDAZOLAM HCL 2 MG/2ML IJ SOLN
INTRAMUSCULAR | Status: AC
  Filled 2018-06-07: qty 2

## 2018-06-07 MED ORDER — ONDANSETRON HCL 4 MG/2ML IJ SOLN
INTRAMUSCULAR | Status: DC | PRN
  Administered 2018-06-07: 4 mg via INTRAVENOUS

## 2018-06-07 MED ORDER — FENTANYL BOLUS VIA INFUSION
25.0000 ug | INTRAVENOUS | Status: DC | PRN
  Filled 2018-06-07: qty 25

## 2018-06-07 MED ORDER — THROMBIN 20000 UNITS EX SOLR
CUTANEOUS | Status: AC
  Filled 2018-06-07: qty 20000

## 2018-06-07 MED ORDER — SUCCINYLCHOLINE CHLORIDE 200 MG/10ML IV SOSY
PREFILLED_SYRINGE | INTRAVENOUS | Status: DC | PRN
  Administered 2018-06-07: 140 mg via INTRAVENOUS

## 2018-06-07 MED ORDER — CYCLOBENZAPRINE HCL 10 MG PO TABS: 10.0000 mg | ORAL_TABLET | Freq: Three times a day (TID) | ORAL | Status: DC | PRN

## 2018-06-07 MED ORDER — TAMSULOSIN HCL 0.4 MG PO CAPS
0.4000 mg | ORAL_CAPSULE | Freq: Every day | ORAL | Status: DC
  Administered 2018-06-10 – 2018-06-28 (×18): 0.4 mg via ORAL
  Filled 2018-06-07 (×19): qty 1

## 2018-06-07 MED ORDER — LOSARTAN POTASSIUM 50 MG PO TABS: 50.0000 mg | ORAL_TABLET | Freq: Every day | ORAL | Status: DC

## 2018-06-07 MED ORDER — DILTIAZEM HCL-DEXTROSE 100-5 MG/100ML-% IV SOLN (PREMIX)
5.0000 mg/h | INTRAVENOUS | Status: DC
  Administered 2018-06-07: 5 mg/h via INTRAVENOUS
  Filled 2018-06-07: qty 100

## 2018-06-07 MED ORDER — ORAL CARE MOUTH RINSE
15.0000 mL | OROMUCOSAL | Status: DC
  Administered 2018-06-07 – 2018-06-11 (×40): 15 mL via OROMUCOSAL

## 2018-06-07 MED ORDER — SODIUM CHLORIDE 0.9 % IV SOLN: 250.0000 mL | INTRAVENOUS | Status: DC

## 2018-06-07 MED ORDER — LACTATED RINGERS IV SOLN
INTRAVENOUS | Status: DC
  Administered 2018-06-07 (×3): via INTRAVENOUS

## 2018-06-07 MED ORDER — PROPOFOL 10 MG/ML IV BOLUS
INTRAVENOUS | Status: DC | PRN
  Administered 2018-06-07: 180 mg via INTRAVENOUS

## 2018-06-07 MED ORDER — DEXAMETHASONE SODIUM PHOSPHATE 10 MG/ML IJ SOLN
INTRAMUSCULAR | Status: DC | PRN
  Administered 2018-06-07: 10 mg via INTRAVENOUS

## 2018-06-07 MED ORDER — BUPIVACAINE HCL (PF) 0.25 % IJ SOLN
INTRAMUSCULAR | Status: DC | PRN
  Administered 2018-06-07: 30 mL

## 2018-06-07 MED ORDER — HYDROMORPHONE HCL 1 MG/ML IJ SOLN: 1.0000 mg | INTRAMUSCULAR | Status: DC | PRN

## 2018-06-07 MED ORDER — DULOXETINE HCL 30 MG PO CPEP: 30.0000 mg | ORAL_CAPSULE | Freq: Every day | ORAL | Status: DC

## 2018-06-07 MED ORDER — PHENYLEPHRINE 40 MCG/ML (10ML) SYRINGE FOR IV PUSH (FOR BLOOD PRESSURE SUPPORT)
PREFILLED_SYRINGE | INTRAVENOUS | Status: DC | PRN
  Administered 2018-06-07: 80 ug via INTRAVENOUS

## 2018-06-07 MED ORDER — BUPIVACAINE HCL (PF) 0.25 % IJ SOLN
INTRAMUSCULAR | Status: AC
  Filled 2018-06-07: qty 30

## 2018-06-07 MED ORDER — CHLORHEXIDINE GLUCONATE CLOTH 2 % EX PADS: 6.0000 | MEDICATED_PAD | Freq: Once | CUTANEOUS | Status: DC

## 2018-06-07 MED ORDER — THROMBIN 5000 UNITS EX SOLR
CUTANEOUS | Status: AC
  Filled 2018-06-07: qty 5000

## 2018-06-07 MED ORDER — SODIUM CHLORIDE 0.9% FLUSH: 3.0000 mL | INTRAVENOUS | Status: DC | PRN

## 2018-06-07 MED ORDER — HYDROCODONE-ACETAMINOPHEN 5-325 MG PO TABS: 1.0000 | ORAL_TABLET | ORAL | Status: DC | PRN

## 2018-06-07 MED ORDER — DOCUSATE SODIUM 50 MG/5ML PO LIQD
100.0000 mg | Freq: Two times a day (BID) | ORAL | Status: DC | PRN
  Administered 2018-06-08: 100 mg
  Filled 2018-06-07: qty 10

## 2018-06-07 MED ORDER — DULOXETINE HCL 60 MG PO CPEP: 60.0000 mg | ORAL_CAPSULE | Freq: Every day | ORAL | Status: DC

## 2018-06-07 MED ORDER — FENTANYL CITRATE (PF) 100 MCG/2ML IJ SOLN: 25.0000 ug | INTRAMUSCULAR | Status: DC | PRN

## 2018-06-07 MED ORDER — THROMBIN 20000 UNITS EX SOLR
CUTANEOUS | Status: DC | PRN
  Administered 2018-06-07: 16:00:00 via TOPICAL

## 2018-06-07 MED ORDER — ROCURONIUM BROMIDE 10 MG/ML (PF) SYRINGE
PREFILLED_SYRINGE | INTRAVENOUS | Status: DC | PRN
  Administered 2018-06-07: 10 mg via INTRAVENOUS
  Administered 2018-06-07 (×2): 20 mg via INTRAVENOUS
  Administered 2018-06-07: 50 mg via INTRAVENOUS

## 2018-06-07 MED ORDER — ONDANSETRON HCL 4 MG/2ML IJ SOLN
INTRAMUSCULAR | Status: AC
  Filled 2018-06-07: qty 2

## 2018-06-07 MED ORDER — DEXMEDETOMIDINE HCL 200 MCG/2ML IV SOLN
INTRAVENOUS | Status: DC | PRN
  Administered 2018-06-07 (×2): 20 ug via INTRAVENOUS

## 2018-06-07 MED ORDER — KETOROLAC TROMETHAMINE 15 MG/ML IJ SOLN
30.0000 mg | Freq: Four times a day (QID) | INTRAMUSCULAR | Status: DC
  Administered 2018-06-08: 30 mg via INTRAVENOUS
  Filled 2018-06-07: qty 2

## 2018-06-07 MED ORDER — THROMBIN 5000 UNITS EX SOLR
CUTANEOUS | Status: DC | PRN
  Administered 2018-06-07 (×2): 5000 [IU] via TOPICAL

## 2018-06-07 MED ORDER — ONDANSETRON HCL 4 MG PO TABS: 4.0000 mg | ORAL_TABLET | Freq: Four times a day (QID) | ORAL | Status: DC | PRN

## 2018-06-07 MED ORDER — BISACODYL 10 MG RE SUPP: 10.0000 mg | Freq: Every day | RECTAL | Status: DC | PRN

## 2018-06-07 MED ORDER — TOBRAMYCIN 0.3 % OP SOLN
1.0000 [drp] | Freq: Three times a day (TID) | OPHTHALMIC | Status: DC
  Administered 2018-06-08 – 2018-06-28 (×62): 1 [drp] via OPHTHALMIC
  Filled 2018-06-07 (×2): qty 5

## 2018-06-07 MED ORDER — VANCOMYCIN HCL 1000 MG IV SOLR
INTRAVENOUS | Status: DC | PRN
  Administered 2018-06-07: 1000 mg via TOPICAL

## 2018-06-07 MED ORDER — ATORVASTATIN CALCIUM 10 MG PO TABS: 20.0000 mg | ORAL_TABLET | Freq: Every day | ORAL | Status: DC

## 2018-06-07 MED ORDER — PROSIGHT PO TABS
1.0000 | ORAL_TABLET | Freq: Every day | ORAL | Status: DC
  Administered 2018-06-08 – 2018-06-28 (×20): 1 via ORAL
  Filled 2018-06-07 (×21): qty 1

## 2018-06-07 MED ORDER — MIRTAZAPINE 15 MG PO TABS: 15.0000 mg | ORAL_TABLET | Freq: Every day | ORAL | Status: DC

## 2018-06-07 MED ORDER — INSULIN ASPART 100 UNIT/ML ~~LOC~~ SOLN
0.0000 [IU] | SUBCUTANEOUS | Status: DC
  Administered 2018-06-08: 1 [IU] via SUBCUTANEOUS
  Administered 2018-06-08 (×2): 2 [IU] via SUBCUTANEOUS
  Administered 2018-06-08: 3 [IU] via SUBCUTANEOUS
  Administered 2018-06-08 (×2): 2 [IU] via SUBCUTANEOUS
  Administered 2018-06-09: 3 [IU] via SUBCUTANEOUS
  Administered 2018-06-09 (×4): 2 [IU] via SUBCUTANEOUS
  Administered 2018-06-10 (×2): 1 [IU] via SUBCUTANEOUS
  Administered 2018-06-10: 2 [IU] via SUBCUTANEOUS
  Administered 2018-06-10: 1 [IU] via SUBCUTANEOUS
  Administered 2018-06-10: 2 [IU] via SUBCUTANEOUS
  Administered 2018-06-10 – 2018-06-11 (×4): 1 [IU] via SUBCUTANEOUS
  Administered 2018-06-11 (×3): 2 [IU] via SUBCUTANEOUS
  Administered 2018-06-11: 1 [IU] via SUBCUTANEOUS
  Administered 2018-06-12: 2 [IU] via SUBCUTANEOUS
  Administered 2018-06-12: 1 [IU] via SUBCUTANEOUS
  Administered 2018-06-12: 2 [IU] via SUBCUTANEOUS
  Administered 2018-06-12 (×2): 1 [IU] via SUBCUTANEOUS
  Administered 2018-06-13: 2 [IU] via SUBCUTANEOUS
  Administered 2018-06-13 (×2): 1 [IU] via SUBCUTANEOUS
  Administered 2018-06-13 (×3): 2 [IU] via SUBCUTANEOUS
  Administered 2018-06-14: 1 [IU] via SUBCUTANEOUS
  Administered 2018-06-14 (×5): 2 [IU] via SUBCUTANEOUS
  Administered 2018-06-15: 1 [IU] via SUBCUTANEOUS
  Administered 2018-06-15: 2 [IU] via SUBCUTANEOUS

## 2018-06-07 MED ORDER — PROPOFOL 10 MG/ML IV BOLUS
INTRAVENOUS | Status: AC
  Filled 2018-06-07: qty 20

## 2018-06-07 MED ORDER — PRESERVISION AREDS 2 PO CHEW: CHEWABLE_TABLET | Freq: Two times a day (BID) | ORAL | Status: DC

## 2018-06-07 MED ORDER — THROMBIN 5000 UNITS EX SOLR
CUTANEOUS | Status: AC
  Filled 2018-06-07: qty 10000

## 2018-06-07 MED ORDER — FENTANYL 2500MCG IN NS 250ML (10MCG/ML) PREMIX INFUSION
25.0000 ug/h | INTRAVENOUS | Status: DC
  Administered 2018-06-07: 75 ug/h via INTRAVENOUS
  Administered 2018-06-08: 150 ug/h via INTRAVENOUS
  Administered 2018-06-09 – 2018-06-10 (×3): 175 ug/h via INTRAVENOUS
  Filled 2018-06-07 (×5): qty 250

## 2018-06-07 MED ORDER — SODIUM CHLORIDE 0.9 % IV SOLN
INTRAVENOUS | Status: DC | PRN
  Administered 2018-06-07: 15:00:00

## 2018-06-07 MED ORDER — ACETAMINOPHEN 500 MG PO TABS: 1000.0000 mg | ORAL_TABLET | Freq: Three times a day (TID) | ORAL | Status: DC | PRN

## 2018-06-07 SURGICAL SUPPLY — 50 items
BAG DECANTER FOR FLEXI CONT (MISCELLANEOUS) ×3 IMPLANT
BENZOIN TINCTURE PRP APPL 2/3 (GAUZE/BANDAGES/DRESSINGS) ×3 IMPLANT
BLADE CLIPPER SURG (BLADE) IMPLANT
BUR CUTTER 7.0 ROUND (BURR) ×3 IMPLANT
CANISTER SUCT 3000ML PPV (MISCELLANEOUS) ×3 IMPLANT
CARTRIDGE OIL MAESTRO DRILL (MISCELLANEOUS) ×1 IMPLANT
CLOSURE WOUND 1/2 X4 (GAUZE/BANDAGES/DRESSINGS) ×1
COVER WAND RF STERILE (DRAPES) ×3 IMPLANT
DECANTER SPIKE VIAL GLASS SM (MISCELLANEOUS) ×3 IMPLANT
DERMABOND ADVANCED (GAUZE/BANDAGES/DRESSINGS) ×2
DERMABOND ADVANCED .7 DNX12 (GAUZE/BANDAGES/DRESSINGS) ×1 IMPLANT
DIFFUSER DRILL AIR PNEUMATIC (MISCELLANEOUS) ×3 IMPLANT
DRAPE HALF SHEET 40X57 (DRAPES) IMPLANT
DRAPE LAPAROTOMY 100X72X124 (DRAPES) ×3 IMPLANT
DRAPE MICROSCOPE LEICA (MISCELLANEOUS) ×3 IMPLANT
DRAPE SURG 17X23 STRL (DRAPES) ×6 IMPLANT
DRSG OPSITE POSTOP 4X6 (GAUZE/BANDAGES/DRESSINGS) ×3 IMPLANT
DURAPREP 26ML APPLICATOR (WOUND CARE) ×3 IMPLANT
DURASEAL APPLICATOR TIP (TIP) ×3 IMPLANT
DURASEAL SPINE SEALANT 3ML (MISCELLANEOUS) ×3 IMPLANT
ELECT REM PT RETURN 9FT ADLT (ELECTROSURGICAL) ×3
ELECTRODE REM PT RTRN 9FT ADLT (ELECTROSURGICAL) ×1 IMPLANT
GAUZE 4X4 16PLY RFD (DISPOSABLE) IMPLANT
GAUZE SPONGE 4X4 12PLY STRL (GAUZE/BANDAGES/DRESSINGS) ×3 IMPLANT
GLOVE ECLIPSE 9.0 STRL (GLOVE) ×3 IMPLANT
GLOVE EXAM NITRILE XL STR (GLOVE) IMPLANT
GOWN STRL REUS W/ TWL LRG LVL3 (GOWN DISPOSABLE) IMPLANT
GOWN STRL REUS W/ TWL XL LVL3 (GOWN DISPOSABLE) ×1 IMPLANT
GOWN STRL REUS W/TWL 2XL LVL3 (GOWN DISPOSABLE) IMPLANT
GOWN STRL REUS W/TWL LRG LVL3 (GOWN DISPOSABLE)
GOWN STRL REUS W/TWL XL LVL3 (GOWN DISPOSABLE) ×2
KIT BASIN OR (CUSTOM PROCEDURE TRAY) ×3 IMPLANT
KIT TURNOVER KIT B (KITS) ×3 IMPLANT
NEEDLE HYPO 22GX1.5 SAFETY (NEEDLE) ×3 IMPLANT
NEEDLE SPNL 22GX3.5 QUINCKE BK (NEEDLE) IMPLANT
NS IRRIG 1000ML POUR BTL (IV SOLUTION) ×3 IMPLANT
OIL CARTRIDGE MAESTRO DRILL (MISCELLANEOUS) ×3
PACK LAMINECTOMY NEURO (CUSTOM PROCEDURE TRAY) ×3 IMPLANT
PAD ARMBOARD 7.5X6 YLW CONV (MISCELLANEOUS) ×9 IMPLANT
PATTIES SURGICAL 1X1 (DISPOSABLE) ×3 IMPLANT
RUBBERBAND STERILE (MISCELLANEOUS) ×6 IMPLANT
SPONGE SURGIFOAM ABS GEL 100 (HEMOSTASIS) ×3 IMPLANT
SPONGE SURGIFOAM ABS GEL SZ50 (HEMOSTASIS) ×3 IMPLANT
STRIP CLOSURE SKIN 1/2X4 (GAUZE/BANDAGES/DRESSINGS) ×2 IMPLANT
SUT PROLENE 6 0 BV (SUTURE) ×9 IMPLANT
SUT VIC AB 2-0 CT1 18 (SUTURE) ×6 IMPLANT
SUT VIC AB 3-0 SH 8-18 (SUTURE) ×3 IMPLANT
TOWEL GREEN STERILE (TOWEL DISPOSABLE) ×3 IMPLANT
TOWEL GREEN STERILE FF (TOWEL DISPOSABLE) ×3 IMPLANT
WATER STERILE IRR 1000ML POUR (IV SOLUTION) ×3 IMPLANT

## 2018-06-07 NOTE — Transfer of Care (Signed)
Immediate Anesthesia Transfer of Care Note  Patient: [name]  Procedure(s) Performed: Laminectomy - T11-T12 (N/A Back)  Patient Location: PACU  Anesthesia Type:General  Level of Consciousness: confused and responds to stimulation  Airway & Oxygen Therapy: Patient Spontanous Breathing and Patient connected to face mask oxygen  Post-op Assessment: Report given to RN and Post -op Vital signs reviewed and stable  Post vital signs: Reviewed and stable  Last Vitals:  Vitals Value Taken Time  BP 140/79 06/07/2018  6:03 PM  Temp    Pulse 124 06/07/2018  6:12 PM  Resp 12 06/07/2018  6:12 PM  SpO2 97 % 06/07/2018  6:12 PM  Vitals shown include unvalidated device data.  Last Pain:  Vitals:   06/07/18 1205  TempSrc: Oral     Green MD called to bedside for patient's mentation, unable to follow commands, noted atrial fibrillation on monitor, BP stable, new PIV started in right AC, precedex bolus given (see anesthesia record) - orders for cardizem gtt, 12 lead EKG and chest x-ray per Malen GauzeFoster MD at  Bedside.     Complications: No apparent anesthesia complications

## 2018-06-07 NOTE — Op Note (Signed)
Date of procedure: 06/07/2018  Date of dictation: Same  Service: Neurosurgery  Preoperative diagnosis: T11-T12 stenosis with myelopathy  Postoperative diagnosis: Same  Procedure Name: T11-T12 decompressive laminectomy  Surgeon:Kalayla Shadden A.Diala Waxman, M.D.  Asst. Surgeon: Doran DurandBergman, NP  Anesthesia: General  Indication: 77 year old male with severe bilateral lower extremity pain sensory loss and weakness.  Work-up demonstrates evidence of marked stenosis at T11-T12.  Patient presents now for decompressive surgery.  Patient status post prior extensive lumbar decompression and fusion surgery.  Operative note: After induction of anesthesia, patient position prone onto bolsters and appropriate padded.  Thoracic and lumbar region prepped and draped sterilely.  Incision made overlying T11-T12.  Dissection performed bilaterally.  Retractor placed.  X-ray taken.  Levels confirmed.  Decompressive laminectomy and then performed using Leksell rongeurs Kerrison Rogers and high-speed drill to remove the entire lamina of T11, the superior aspect of the lamina of T12 and the medial aspect of the T11-T12 facet joints.  Ligament flavum elevated and resected.  In the course of decompressing the left lateral thecal sac osteophytes associated with the superior facet of T12 was tightly adherent if not invasive to the dura.  Resecting these osteophytes led to an unintended durotomy.  The decompression was completed.  Microscope brought in field used for microdissection of the spinal canal.  The durotomy was then closed with 6-0 Prolene in a watertight fashion.  There was no evidence of injury to the spinal cord or nerve roots associated with this durotomy.  Wound was then irrigated with antibiotic solution.  Gelfoam was placed laterally for hemostasis then removed.  DuraSeal sealant was then placed over the dural repair and laminectomy defect.  Gelfoam was placed over the laminectomy defect.  Vancomycin powder was placed in the  deep wound space.  Wounds and closed in layers with Vicryl sutures.  Steri-Strips and sterile dressing were applied.  No apparent complications.  Patient tolerated the procedure well and he returns to the recovery room postop.

## 2018-06-07 NOTE — Anesthesia Postprocedure Evaluation (Addendum)
Anesthesia Post Note  Patient: [name]  Procedure(s) Performed: Laminectomy - T11-T12 (N/A Back)     Patient location during evaluation: PACU Anesthesia Type: General Level of consciousness: awake, lethargic and patient uncooperative Pain management: pain level controlled Vital Signs Assessment: vitals unstable Respiratory status: spontaneous breathing and respiratory function unstable Cardiovascular status: blood pressure returned to baseline, tachycardic and unstable Postop Assessment: no apparent nausea or vomiting Anesthetic complications: yes Anesthetic complication details: respiratory eventComments: Patient had some respiratory difficulty in PACU, with respiratory obstruction. He was placed on BiPAP in PACU. He was noted to be in Atrial Fibrillation with RVR 140-150's on the monitor which was confirmed by 12 lead EKG. He was bolused with Cardizem and a drip was started for rate control. Critical care was consulted. Patient will be going to ICU post op. Dr. Jordan LikesPool informed.    Last Vitals:  Vitals:   06/07/18 1809 06/07/18 1824  BP: (!) 143/79   Pulse: (!) 122 (!) 138  Resp: 19 (!) 21  Temp:    SpO2: 99% 93%    Last Pain:  Vitals:   06/07/18 1205  TempSrc: Oral                 Chloeanne Poteet A.

## 2018-06-07 NOTE — Consult Note (Signed)
ATTESTATION & SIGNATURE   STAFF NOTE: I, Dr Lavinia SharpsM Coni Homesley have personally reviewed patient's available data, including medical history, events of note, physical examination and test results as part of my evaluation. I have discussed with resident/NP and other care providers such as pharmacist, RN and RRT.  In addition,  I personally evaluated patient and elicited key findings of     S: Hx mainly from Dr Malen GauzeFoster anesthesiologist . Primary MD is Dr Dutch QuintPoole NSGY   77 year old man morbidly obese with a BMI over 50.  Also has sleep apnea and A. fib.  At home is on CPAP.  He had spinal stenosis and therefore underwent T11-T12 laminectomy.  According to Dr. Malen GauzeFoster there was a dural tear in the operating room resulting in 100 cc CSF loss.  His sinus rhythm in the operating room.  Postoperatively he was extubated in the PACU but upon extubation he has been moaning and groaning because of incisional pain.  Also atrial fibrillation with heart rate 140 started on Cardizem.  Also with apneic spells because of postop anesthesia and underlying obesity sleep apnea and started on BiPAP.  Being admitted from PACU to 4 N. ICU.  Critical care medicine called for consultation.   has a past medical history of Aortic aneurysm (HCC) (02/25/2018), Arthritis, Constipation due to pain medication, Depression, Dysrhythmia (08/31/2015), Headache(784.0), Hepatitis, Hepatitis B, Hypertension, Lumbar neuralgia, Sleep apnea (1990's), and Urinary frequency.   reports that he has never smoked. His smokeless tobacco use includes snuff.  Past Surgical History:  Procedure Laterality Date  . APPENDECTOMY    . BACK SURGERY     7 lumbar surgeries  . CYSTOSCOPY WITH RETROGRADE PYELOGRAM, URETEROSCOPY AND STENT PLACEMENT Left 08/27/2015   Procedure: CYSTOSCOPY WITH BILATERAL RETROGRADE PYELOGRAM, URETEROSCOPY AND BILATERAL STENT PLACEMENT;  Surgeon: Sebastian Acheheodore Manny, MD;  Location: [facility];  Service: Urology;  Laterality: Left;  .  CYSTOSCOPY/URETEROSCOPY/HOLMIUM LASER/STENT PLACEMENT Left 10/18/2015   Procedure: CYSTOSCOPY/URETEROSCOPY/HOLMIUM LASER/STENT PLACEMENT;  Surgeon: Ihor GullyMark Ottelin, MD;  [facility];  Service: Urology;  Laterality: Left;  . JOINT REPLACEMENT Right 1990's   Rt shoulder  . JOINT REPLACEMENT Right 2013   knee    Allergies  Allergen Reactions  . Ibuprofen Hives and Rash  . Gabapentin Other (See Comments)    "makes me loopy"     There is no immunization history on file for this patient.  No family history on file.   Current Facility-Administered Medications:  .  6 CHG cloth bath night before surgery, , , Once **AND** [START ON 06/08/2018] 6 CHG cloth bath AM of surgery, , , Once **AND** Chlorhexidine Gluconate Cloth 2 % PADS 6 each, 6 each, Topical, Once **AND** Chlorhexidine Gluconate Cloth 2 % PADS 6 each, 6 each, Topical, Once, Pool, Sherilyn CooterHenry, MD .  diltiazem (CARDIZEM) 100 mg in dextrose 5% 100mL (1 mg/mL) infusion, 5-20 mg/hr, Intravenous, Titrated, Mal AmabileFoster, Michael, MD, Last Rate: 5 mL/hr at 06/07/18 1828, 5 mg/hr at 06/07/18 1828 .  fentaNYL (SUBLIMAZE) injection 25-50 mcg, 25-50 mcg, Intravenous, Q5 min PRN, Dorris SinghGreen, Charlene, MD, 25 mcg at 06/07/18 1851 .  lactated ringers infusion, , Intravenous, Continuous, Dorris SinghGreen, Charlene, MD, Last Rate: 50 mL/hr at 06/07/18 1246     O: At the time of my evaluation in the PACU: Morbidly obese male moaning and groaning.  BiPAP on.  Maintaining blood pressure tachycardic heart rate 117.  He is moving air.  He is able to say he has pain.  His bilateral lower extremities show edema with  erythema distally on the shin.  The right side erythema somewhat warmer.   LABS    PULMONARY No results for input(s): PHART, PCO2ART, PO2ART, HCO3, TCO2, O2SAT in the last 168 hours.  Invalid input(s): PCO2, PO2  CBC No results for input(s): HGB, HCT, WBC, PLT in the last 168 hours.  COAGULATION No results for input(s): INR in the last 168  hours.  CARDIAC  No results for input(s): TROPONINI in the last 168 hours. No results for input(s): PROBNP in the last 168 hours.   CHEMISTRY No results for input(s): NA, K, CL, CO2, GLUCOSE, BUN, CREATININE, CALCIUM, MG, PHOS in the last 168 hours. Estimated Creatinine Clearance: 101.8 mL/min (by C-G formula based on SCr of 0.88 mg/dL).   LIVER No results for input(s): AST, ALT, ALKPHOS, BILITOT, PROT, ALBUMIN, INR in the last 168 hours.   INFECTIOUS No results for input(s): LATICACIDVEN, PROCALCITON in the last 168 hours.   ENDOCRINE CBG (last 3)  Recent Labs    06/07/18 1842  GLUCAP 204*         IMAGING x48h  - image(s) personally visualized  -   highlighted in bold Dg Thoracic Spine 1 View  Result Date: 06/07/2018 CLINICAL DATA:  Spinal stenosis EXAM: OPERATIVE THORACIC SPINE 1 VIEW(S) COMPARISON:  04/25/2017 FINDINGS: Tissue spreaders are in place posteriorly at T11-12. Probe marks this level. IMPRESSION: T11-12 localized. Electronically Signed   By: Paulina FusiMark  Shogry M.D.   On: 06/07/2018 15:32     A: Acute postoperative respiratory failure -because of underlying morbid obesity and sleep apnea and postoperative anesthesia and postoperative pain  Severe postoperative pain  A. fib RVR  P: Agree with admit ICU BiPAP but intubate if he gets worse Cardizem for A. fib control Check ABG and labs Check sepsis biomarkers and depending on course start antibiotics for possible cellulitis    The patient is critically ill with multiple organ systems failure and requires high complexity decision making for assessment and support, frequent evaluation and titration of therapies, application of advanced monitoring technologies and extensive interpretation of multiple databases.   Critical Care Time devoted to patient care services described in this note is  30  Minutes. This time reflects time of care of this signee Dr Kalman ShanMurali Fizza Scales. This critical care time does not  reflect procedure time, or teaching time or supervisory time of PA/NP/Med student/Med Resident etc but could involve care discussion time     Dr. Kalman ShanMurali Zayanna Pundt, M.D., [facility] Pulmonary and Critical Care Medicine Staff Physician [facility] [place] Pulmonary and Critical Care Pager: [phone], If no answer or between  15:00h - 7:00h: call [phone]  06/07/2018 6:55 PM

## 2018-06-07 NOTE — Consult Note (Signed)
NAME:  [name], MRN:  [id number], DOB:  Nov 08, 1940, LOS: 0 ADMISSION DATE:  06/07/2018, CONSULTATION DATE:  06/07/2018 REFERRING MD:  Jordan LikesPool, CHIEF COMPLAINT:  Post op sedation   Brief History   77 year old male with previous spinal surgeries admitted with severe spinal stenosis from T11-T12.  Underwent T11-T12 decompressive laminectomy on 12/20.  Postoperatively he had uncontrolled pain and obstructed airway and was placed on BiPAP with concern for intubation needed.  PCCM consulted.  History of present illness   77 year old male with past medical history as below, which is significant for medical noncompliance, sleep apnea on CPAP, hypertension, atrial fibrillation, and surgical back/spine surgeries.  He presented to neurosurgery with complaints of increasing weakness, sensory loss, and pain extending into both lower extremities.  Work-up demonstrated evidence of fusion in his lower thoracic spine.  He was essentially nonambulatory secondary to pain and presented for decompressive laminectomy on 12/20.  The postoperative.  He was extubated but continued to be agitated and was questionably obstructing his airway requiring him to be started on BiPAP.  While remaining hemodynamically stable, he developed A. fib with rapid ventricular response and was started on diltiazem infusion.  With concern over his airway and atrial fibrillation pulmonary critical care medicine was consulted.  Past Medical History   has a past medical history of Aortic aneurysm (HCC) (02/25/2018), Arthritis, Constipation due to pain medication, Depression, Dysrhythmia (08/31/2015), Headache(784.0), Hepatitis, Hepatitis B, Hypertension, Lumbar neuralgia, Sleep apnea (1990's), and Urinary frequency.  Significant Hospital Events   12/20 admit, to OR for t11-t12 decompressive laminectomy.   Consults:    Procedures:  12/20 T11-T12 decompressive laminectomy  Significant Diagnostic Tests:  MRI thoracic spine 9/17 > advanced  T11-T12 degenerative disease with cord compression and foraminal impingement.  Micro Data:    Antimicrobials:  Periop cefazolin and vanco  Interim history/subjective:    Objective   Blood pressure 120/66, pulse (!) 123, temperature (!) 97.2 F (36.2 C), resp. rate (!) 26, SpO2 96 %.        Intake/Output Summary (Last 24 hours) at 06/07/2018 1855 Last data filed at 06/07/2018 1720 Gross per 24 hour  Intake 2000 ml  Output 650 ml  Net 1350 ml   There were no vitals filed for this visit.  Examination: General: morbidly obese male on BiPAP HENT: [place]/AT, PERRL, unable to appreciate JVD due to neck girth Lungs: Clear bilateral breath sounds Cardiovascular: Tachy, IRIR, no MRG Abdomen: Obese, soft, non-distended. Generalized tenderness.  Extremities: No acute deformity. Erythema and warmth extending about halfway up calf bilaterally. Neuro: Agitated/Confused  Resolved Hospital Problem list     Assessment & Plan:   At risk poor airway protection the postoperative setting.  - Continue BiPAP for now, will need to watch closely for intubation risk due to airway and pain needs.   - check ABG, BMP, CBC  Thoracic spine stenosis s/p T11-T12 decompressive laminectomy.  - Per neurosurgery  Atrial fibrillation with RVR history of atrial fib as well.  - telemetry monitoring - continue diltiazem infusion - Gentle IVF hydration  Chronic HFrEF and dilated ascending aorta by Echo at [facility], we do not have records of these - Take care with volume resuscitation -  Consider repeat echo  Post op pain management - PRN fentanyl     Best practice:  Diet: NPO Pain/Anxiety/Delirium protocol (if indicated): Fentanyl PRN VAP protocol (if indicated): N/a DVT prophylaxis: N/a GI prophylaxis: N/a Glucose control: chemistry pending Mobility: BR Code Status: FULL Family Communication: Patient updated  Disposition: ICU  Labs   CBC: No results for input(s): WBC, NEUTROABS, HGB,  HCT, MCV, PLT in the last 168 hours.  Basic Metabolic Panel: No results for input(s): NA, K, CL, CO2, GLUCOSE, BUN, CREATININE, CALCIUM, MG, PHOS in the last 168 hours. GFR: Estimated Creatinine Clearance: 101.8 mL/min (by C-G formula based on SCr of 0.88 mg/dL). No results for input(s): PROCALCITON, WBC, LATICACIDVEN in the last 168 hours.  Liver Function Tests: No results for input(s): AST, ALT, ALKPHOS, BILITOT, PROT, ALBUMIN in the last 168 hours. No results for input(s): LIPASE, AMYLASE in the last 168 hours. No results for input(s): AMMONIA in the last 168 hours.  ABG    Component Value Date/Time   PHART 7.349 (L) 08/27/2015 1016   PCO2ART 46.9 (H) 08/27/2015 1016   PO2ART 106 (H) 08/27/2015 1016   HCO3 24.5 (H) 08/27/2015 1016   TCO2 28 10/18/2015 0910   ACIDBASEDEF 0.2 08/27/2015 1016   O2SAT 97.2 08/27/2015 1016     Coagulation Profile: No results for input(s): INR, PROTIME in the last 168 hours.  Cardiac Enzymes: No results for input(s): CKTOTAL, CKMB, CKMBINDEX, TROPONINI in the last 168 hours.  HbA1C: Hgb A1c MFr Bld  Date/Time Value Ref Range Status  07/04/2007 12:55 AM   Final   5.6 (NOTE)   The ADA recommends the following therapeutic goals for glycemic   control related to Hgb A1C measurement:   Goal of Therapy:   < 7.0% Hgb A1C   Action Suggested:  > 8.0% Hgb A1C   Ref:  Diabetes Care, 22, Suppl. 1, 1999    CBG: Recent Labs  Lab 06/07/18 1842  GLUCAP 204*    Review of Systems:   Unable as patient is encephalopathic  Past Medical History  He,  has a past medical history of Aortic aneurysm (HCC) (02/25/2018), Arthritis, Constipation due to pain medication, Depression, Dysrhythmia (08/31/2015), Headache(784.0), Hepatitis, Hepatitis B, Hypertension, Lumbar neuralgia, Sleep apnea (1990's), and Urinary frequency.   Surgical History    Past Surgical History:  Procedure Laterality Date  . APPENDECTOMY    . BACK SURGERY     7 lumbar surgeries  .  CYSTOSCOPY WITH RETROGRADE PYELOGRAM, URETEROSCOPY AND STENT PLACEMENT Left 08/27/2015   Procedure: CYSTOSCOPY WITH BILATERAL RETROGRADE PYELOGRAM, URETEROSCOPY AND BILATERAL STENT PLACEMENT;  Surgeon: Sebastian Acheheodore Manny, MD;  Location: [facility];  Service: Urology;  Laterality: Left;  . CYSTOSCOPY/URETEROSCOPY/HOLMIUM LASER/STENT PLACEMENT Left 10/18/2015   Procedure: CYSTOSCOPY/URETEROSCOPY/HOLMIUM LASER/STENT PLACEMENT;  Surgeon: Ihor GullyMark Ottelin, MD;  [facility];  Service: Urology;  Laterality: Left;  . JOINT REPLACEMENT Right 1990's   Rt shoulder  . JOINT REPLACEMENT Right 2013   knee     Social History   reports that he has never smoked. His smokeless tobacco use includes snuff. He reports that he does not drink alcohol or use drugs.   Family History   His family history is not on file.   Allergies Allergies  Allergen Reactions  . Ibuprofen Hives and Rash  . Gabapentin Other (See Comments)    "makes me loopy"     Home Medications  Prior to Admission medications   Medication Sig Start Date End Date Taking? Authorizing Provider  acetaminophen (TYLENOL) 650 MG CR tablet Take 1,300 mg by mouth every 8 (eight) hours as needed for pain.   Yes [provider]  aspirin EC 81 MG tablet Take 81 mg by mouth daily.   Yes [provider]  atorvastatin (LIPITOR) 20 MG tablet Take 20  mg by mouth daily.   Yes [provider]  celecoxib (CELEBREX) 200 MG capsule Take 200 mg by mouth 2 (two) times daily.    Yes [provider]  Cholecalciferol (VITAMIN D3) 125 MCG (5000 UT) CAPS Take 5,000 Units by mouth daily.   Yes [provider]  DULoxetine (CYMBALTA) 30 MG capsule Take 30 mg by mouth daily.   Yes [provider]  DULoxetine (CYMBALTA) 60 MG capsule Take 60 mg by mouth daily.    Yes [provider]  HYDROcodone-acetaminophen (NORCO) 10-325 MG tablet Take 1-2 tablets by mouth every 4 (four) hours as needed for  moderate pain. Maximum dose per 24 hours - 8 pills Patient taking differently: Take 1-2 tablets by mouth See admin instructions. Take 2 tablets by mouth in the morning, take 1 tablet by mouth at noon and take 1 tablet by mouth in the evening 10/18/15  Yes Ihor Gully, MD  losartan (COZAAR) 50 MG tablet Take 50 mg by mouth daily.   Yes [provider]  mirtazapine (REMERON) 15 MG tablet Take 15 mg by mouth at bedtime.   Yes [provider]  Multiple Vitamins-Minerals (PRESERVISION AREDS 2 PO) Take 1 capsule by mouth 2 (two) times daily.   Yes [provider]  tamsulosin (FLOMAX) 0.4 MG CAPS capsule Take 0.4 mg by mouth daily.    Yes [provider]  tobramycin (TOBREX) 0.3 % ophthalmic solution Place 1 drop into the right eye See admin instructions. Place 1 drop in right eye 3 times daily the day before, the day of and the day after eye shots   Yes [provider]  furosemide (LASIX) 20 MG tablet Take 20 mg by mouth daily.    [provider]      Joneen Roach, AGACNP-BC [facility] Pulmonary/Critical Care Pager [phone] or [phone]  06/07/2018 7:18 PM

## 2018-06-07 NOTE — Progress Notes (Signed)
RT Note:  Transported patient from PACU to 4N26 without any complications.  Patient rate was up due to pain.

## 2018-06-07 NOTE — Anesthesia Procedure Notes (Signed)
Procedure Name: Intubation Date/Time: 06/07/2018 2:48 PM Performed by: Laruth BouchardGolob, Shlomo Seres C., CRNA Pre-anesthesia Checklist: Patient identified, Emergency Drugs available, Suction available, Patient being monitored and Timeout performed Patient Re-evaluated:Patient Re-evaluated prior to induction Oxygen Delivery Method: Circle system utilized Preoxygenation: Pre-oxygenation with 100% oxygen Induction Type: IV induction Ventilation: Two handed mask ventilation required and Oral airway inserted - appropriate to patient size Laryngoscope Size: Glidescope and 4 Grade View: Grade I Tube type: Oral Tube size: 7.5 mm Number of attempts: 1 Airway Equipment and Method: Rigid stylet and Video-laryngoscopy Placement Confirmation: ETT inserted through vocal cords under direct vision,  positive ETCO2 and breath sounds checked- equal and bilateral Secured at: 23 cm Tube secured with: Tape Dental Injury: Teeth and Oropharynx as per pre-operative assessment

## 2018-06-07 NOTE — H&P (Signed)
[place] L [name] is an 77 y.o. male.   Chief Complaint: Weakness HPI: [age] year old male in poor general health with a extensive history of prior spinal surgeries presents with increasing weakness sensory loss and pain extending into both lower extremities.  Work-up demonstrates evidence of fusion in his mid and lower thoracic spine secondary to DISH.  Patient status post operative fusion from L1-L5.  Patient with some degree of autofusion at T12-L1 and patient with some residual segmental motion at T11-T12.  Patient with severe facet arthropathy and critical spinal stenosis with cord compression at this level.  Patient presents now for simple decompressive surgery in hopes of improving his symptoms.  Patient is essentially nonambulatory secondary to pain.  He has weakness in both lower extremities but still has better than antigravity strength.  Past Medical History:  Diagnosis Date  . Aortic aneurysm (HCC) 02/25/2018  . Arthritis    back,joints,neck-limited mobility,flexion-  . Constipation due to pain medication   . Depression   . Dysrhythmia 08/31/2015   AF new onset    /NSR noted 09/23/15  . Headache(784.0)   . Hepatitis   . Hepatitis B    "years ago"  . Hypertension   . Lumbar neuralgia   . Sleep apnea 1990's  . Urinary frequency     Past Surgical History:  Procedure Laterality Date  . APPENDECTOMY    . BACK SURGERY     7 lumbar surgeries  . CYSTOSCOPY WITH RETROGRADE PYELOGRAM, URETEROSCOPY AND STENT PLACEMENT Left 08/27/2015   Procedure: CYSTOSCOPY WITH BILATERAL RETROGRADE PYELOGRAM, URETEROSCOPY AND BILATERAL STENT PLACEMENT;  Surgeon: Sebastian Acheheodore Manny, MD;  Location: [facility];  Service: Urology;  Laterality: Left;  . CYSTOSCOPY/URETEROSCOPY/HOLMIUM LASER/STENT PLACEMENT Left 10/18/2015   Procedure: CYSTOSCOPY/URETEROSCOPY/HOLMIUM LASER/STENT PLACEMENT;  Surgeon: Ihor GullyMark Ottelin, MD;  [facility] [place];  Service: Urology;  Laterality: Left;  . JOINT REPLACEMENT Right  1990's   Rt shoulder  . JOINT REPLACEMENT Right 2013   knee    No family history on file. Social History:  reports that he has never smoked. His smokeless tobacco use includes snuff. He reports that he does not drink alcohol or use drugs.  Allergies:  Allergies  Allergen Reactions  . Ibuprofen Hives and Rash  . Gabapentin Other (See Comments)    "makes me loopy"    Medications Prior to Admission  Medication Sig Dispense Refill  . acetaminophen (TYLENOL) 650 MG CR tablet Take 1,300 mg by mouth every 8 (eight) hours as needed for pain.    Marland Kitchen. aspirin EC 81 MG tablet Take 81 mg by mouth daily.    Marland Kitchen. atorvastatin (LIPITOR) 20 MG tablet Take 20 mg by mouth daily.    . celecoxib (CELEBREX) 200 MG capsule Take 200 mg by mouth 2 (two) times daily.     . Cholecalciferol (VITAMIN D3) 125 MCG (5000 UT) CAPS Take 5,000 Units by mouth daily.    . DULoxetine (CYMBALTA) 30 MG capsule Take 30 mg by mouth daily.    . DULoxetine (CYMBALTA) 60 MG capsule Take 60 mg by mouth daily.     . furosemide (LASIX) 20 MG tablet Take 20 mg by mouth daily.    Marland Kitchen. HYDROcodone-acetaminophen (NORCO) 10-325 MG tablet Take 1-2 tablets by mouth every 4 (four) hours as needed for moderate pain. Maximum dose per 24 hours - 8 pills (Patient taking differently: Take 1-2 tablets by mouth See admin instructions. Take 2 tablets by mouth in the morning, take 1 tablet by mouth at noon and take  1 tablet by mouth in the evening) 30 tablet 0  . losartan (COZAAR) 50 MG tablet Take 50 mg by mouth daily.    . mirtazapine (REMERON) 15 MG tablet Take 15 mg by mouth at bedtime.    . Multiple Vitamins-Minerals (PRESERVISION AREDS 2 PO) Take 1 capsule by mouth 2 (two) times daily.    . tamsulosin (FLOMAX) 0.4 MG CAPS capsule Take 0.4 mg by mouth daily.     Marland Kitchen. tobramycin (TOBREX) 0.3 % ophthalmic solution Place 1 drop into the right eye See admin instructions. Place 1 drop in right eye 3 times daily the day before, the day of and the day after eye  shots      No results found for this or any previous visit (from the past 48 hour(s)). No results found.  Pertinent items noted in HPI and remainder of comprehensive ROS otherwise negative.  Blood pressure (!) 176/61, pulse (!) 41, temperature 97.8 F (36.6 C), temperature source Oral, resp. rate 18, SpO2 96 %.  Patient is awake and alert.  He is oriented and reasonably appropriate.  He is obviously uncomfortable.  Examination head ears eyes nose throat is unremarkable her chest and abdomen are obese but otherwise benign.  Extremities are free from injury or deformity.  Neurologically he is awake and alert.  His speech is fluent.  His judgment and insight are intact.  Cranial nerve function normal bilateral.  Motor examination the extremities with good strength in both upper extremities.  Lower extremity strength 4/5.  Significant lower extremity edema.  Sensory examination with relative sensory level around T11.  Reflexes are absent in both lower extremities.  No evidence of long track signs. Assessment/Plan Severe spinal stenosis T11-T12.  Plan T11-T12 decompressive laminectomy.  Risks and benefits of been explained.  Patient wishes to proceed.  Sherilyn CooterHenry A Aydian Dimmick 06/07/2018, 12:15 PM

## 2018-06-07 NOTE — Brief Op Note (Signed)
06/07/2018  5:02 PM  PATIENT:  [name]  77 y.o. male  PRE-OPERATIVE DIAGNOSIS:  Stenosis  POST-OPERATIVE DIAGNOSIS:  stenosis   PROCEDURE:  Procedure(s): Laminectomy - T11-T12 (N/A)  SURGEON:  Surgeon(s) and Role:    * Julio SicksPool, Gildo Crisco, MD - Primary  PHYSICIAN ASSISTANT:   ASSISTANTSSelinda Orion: Berman, NP   ANESTHESIA:   general  EBL:  650 mL   BLOOD ADMINISTERED:none  DRAINS: none   LOCAL MEDICATIONS USED:  MARCAINE     SPECIMEN:  No Specimen  DISPOSITION OF SPECIMEN:  N/A  COUNTS:  YES  TOURNIQUET:  * No tourniquets in log *  DICTATION: .Dragon Dictation  PLAN OF CARE: Admit to inpatient   PATIENT DISPOSITION:  PACU - hemodynamically stable.   Delay start of Pharmacological VTE agent (>24hrs) due to surgical blood loss or risk of bleeding: yes

## 2018-06-07 NOTE — Procedures (Signed)
Intubation Procedure Note [name] [id number] 03-15-1941  Procedure: Intubation Indications: Airway protection and maintenance  Procedure Details Consent: Risks of procedure as well as the alternatives and risks of each were explained to the (patient/caregiver).  Consent for procedure obtained. Time Out: Verified patient identification, verified procedure, site/side was marked, verified correct patient position, special equipment/implants available, medications/allergies/relevent history reviewed, required imaging and test results available.  Performed  Maximum sterile technique was used including gloves, hand hygiene and mask.  MAC and 4  RSI: Lidocaine- 5cc-> 117m Etomidate- 263mPropofol - 7059mocuronium 73m52mvaluation Hemodynamic Status: BP stable throughout; O2 sats: stable throughout Patient's Current Condition: stable Complications: No apparent complications Patient did tolerate procedure well. Chest X-ray ordered to verify placement.  CXR: pending.   [name] 06/07/2018

## 2018-06-07 NOTE — Significant Event (Signed)
PCCM at Bedside  Pt POD 0  Presents to 4 N in worsening SOB Not comfortable  On BiPAP and saturations 85-87% Remains awake and verbal  RN at bedside Spoke to patient and family after much discussion of risk/benefit and need for intubation due to respiratory status and risk of compromise decision was made to intubate.  Pt will be satrted on Pain and sedation Mgmt  VAPrecautions And a CXR following intubation.  Marland Kitchen..Signed Dr Newell CoralKristen Yanis [facility] Locums

## 2018-06-07 NOTE — Anesthesia Preprocedure Evaluation (Signed)
Anesthesia Evaluation  Patient identified by MRN, date of birth, ID band Patient awake    Reviewed: Allergy & Precautions, NPO status , Patient's Chart, lab work & pertinent test results  Airway Mallampati: II  TM Distance: >3 FB     Dental   Pulmonary sleep apnea ,    breath sounds clear to auscultation       Cardiovascular hypertension, + dysrhythmias  Rhythm:Regular Rate:Normal     Neuro/Psych  Headaches,    GI/Hepatic negative GI ROS, (+) Hepatitis -  Endo/Other    Renal/GU Renal disease     Musculoskeletal   Abdominal   Peds  Hematology   Anesthesia Other Findings   Reproductive/Obstetrics                             Anesthesia Physical Anesthesia Plan  ASA: III  Anesthesia Plan: General   Post-op Pain Management:    Induction: Intravenous  PONV Risk Score and Plan: 2 and Treatment may vary due to age or medical condition, Ondansetron, Dexamethasone and Midazolam  Airway Management Planned: Oral ETT  Additional Equipment:   Intra-op Plan:   Post-operative Plan: Possible Post-op intubation/ventilation  Informed Consent: I have reviewed the patients History and Physical, chart, labs and discussed the procedure including the risks, benefits and alternatives for the proposed anesthesia with the patient or authorized representative who has indicated his/her understanding and acceptance.   Dental advisory given  Plan Discussed with: CRNA and Anesthesiologist  Anesthesia Plan Comments:         Anesthesia Quick Evaluation

## 2018-06-08 ENCOUNTER — Inpatient Hospital Stay (HOSPITAL_COMMUNITY): Payer: Medicare HMO

## 2018-06-08 ENCOUNTER — Encounter (HOSPITAL_COMMUNITY): Payer: Self-pay | Admitting: Neurosurgery

## 2018-06-08 LAB — BASIC METABOLIC PANEL
Anion gap: 10 (ref 5–15)
Anion gap: 9 (ref 5–15)
BUN: 21 mg/dL (ref 8–23)
BUN: 20 mg/dL (ref 8–23)
CALCIUM: 8.8 mg/dL — AB (ref 8.9–10.3)
CO2: 33 mmol/L — ABNORMAL HIGH (ref 22–32)
CO2: 31 mmol/L (ref 22–32)
Calcium: 8.7 mg/dL — ABNORMAL LOW (ref 8.9–10.3)
Chloride: 95 mmol/L — ABNORMAL LOW (ref 98–111)
Chloride: 96 mmol/L — ABNORMAL LOW (ref 98–111)
Creatinine, Ser: 0.95 mg/dL (ref 0.61–1.24)
Creatinine, Ser: 0.87 mg/dL (ref 0.61–1.24)
GFR calc Af Amer: >60 mL/min (ref >60)
GFR calc Af Amer: >60 mL/min (ref >60)
GFR calc non Af Amer: >60 mL/min (ref >60)
GFR calc non Af Amer: >60 mL/min (ref >60)
Glucose, Bld: 242 mg/dL — ABNORMAL HIGH (ref 70–99)
Glucose, Bld: 173 mg/dL — ABNORMAL HIGH (ref 70–99)
POTASSIUM: 4.5 mmol/L (ref 3.5–5.1)
Potassium: 5.8 mmol/L — ABNORMAL HIGH (ref 3.5–5.1)
Sodium: 138 mmol/L (ref 135–145)
Sodium: 136 mmol/L (ref 135–145)

## 2018-06-08 LAB — CBC WITH DIFFERENTIAL/PLATELET
Abs Immature Granulocytes: 0.1 10*3/uL — ABNORMAL HIGH (ref 0.00–0.07)
BASOS ABS: 0 10*3/uL (ref 0.0–0.1)
Basophils Relative: 0 %
Eosinophils Absolute: 0 10*3/uL (ref 0.0–0.5)
Eosinophils Relative: 0 %
HCT: 40.6 % (ref 39.0–52.0)
Hemoglobin: 12.7 g/dL — ABNORMAL LOW (ref 13.0–17.0)
Immature Granulocytes: 1 %
Lymphocytes Relative: 5 %
Lymphs Abs: 0.5 10*3/uL — ABNORMAL LOW (ref 0.7–4.0)
MCH: 29.3 pg (ref 26.0–34.0)
MCHC: 31.3 g/dL (ref 30.0–36.0)
MCV: 93.5 fL (ref 80.0–100.0)
Monocytes Absolute: 0.4 10*3/uL (ref 0.1–1.0)
Monocytes Relative: 4 %
Neutro Abs: 9.6 10*3/uL — ABNORMAL HIGH (ref 1.7–7.7)
Neutrophils Relative %: 90 %
Platelets: 154 10*3/uL (ref 150–400)
RBC: 4.34 MIL/uL (ref 4.22–5.81)
RDW: 13.8 % (ref 11.5–15.5)
WBC: 10.7 10*3/uL — ABNORMAL HIGH (ref 4.0–10.5)
nRBC: 0 % (ref 0.0–0.2)

## 2018-06-08 LAB — CBC
HCT: 37.3 % — ABNORMAL LOW (ref 39.0–52.0)
HEMATOCRIT: 39.2 % (ref 39.0–52.0)
Hemoglobin: 11.6 g/dL — ABNORMAL LOW (ref 13.0–17.0)
Hemoglobin: 12.4 g/dL — ABNORMAL LOW (ref 13.0–17.0)
MCH: 29.1 pg (ref 26.0–34.0)
MCH: 29.8 pg (ref 26.0–34.0)
MCHC: 31.1 g/dL (ref 30.0–36.0)
MCHC: 31.6 g/dL (ref 30.0–36.0)
MCV: 93.5 fL (ref 80.0–100.0)
MCV: 94.2 fL (ref 80.0–100.0)
Platelets: 152 10*3/uL (ref 150–400)
Platelets: 155 10*3/uL (ref 150–400)
RBC: 3.99 MIL/uL — ABNORMAL LOW (ref 4.22–5.81)
RBC: 4.16 MIL/uL — ABNORMAL LOW (ref 4.22–5.81)
RDW: 13.8 % (ref 11.5–15.5)
RDW: 13.8 % (ref 11.5–15.5)
WBC: 10.4 10*3/uL (ref 4.0–10.5)
WBC: 12.3 10*3/uL — ABNORMAL HIGH (ref 4.0–10.5)
nRBC: 0 % (ref 0.0–0.2)
nRBC: 0 % (ref 0.0–0.2)

## 2018-06-08 LAB — GLUCOSE, CAPILLARY
GLUCOSE-CAPILLARY: 200 mg/dL — AB (ref 70–99)
Glucose-Capillary: 211 mg/dL — ABNORMAL HIGH (ref 70–99)
Glucose-Capillary: 163 mg/dL — ABNORMAL HIGH (ref 70–99)
Glucose-Capillary: 179 mg/dL — ABNORMAL HIGH (ref 70–99)
Glucose-Capillary: 152 mg/dL — ABNORMAL HIGH (ref 70–99)
Glucose-Capillary: 139 mg/dL — ABNORMAL HIGH (ref 70–99)
Glucose-Capillary: 149 mg/dL — ABNORMAL HIGH (ref 70–99)

## 2018-06-08 LAB — POCT I-STAT 3, ART BLOOD GAS (G3+)
Acid-Base Excess: 7 mmol/L — ABNORMAL HIGH (ref 0.0–2.0)
Bicarbonate: 34 mmol/L — ABNORMAL HIGH (ref 20.0–28.0)
O2 SAT: 98 %
PCO2 ART: 58 mmHg — AB (ref 32.0–48.0)
Patient temperature: 99.3
TCO2: 36 mmol/L — ABNORMAL HIGH (ref 22–32)
pH, Arterial: 7.378 (ref 7.350–7.450)
pO2, Arterial: 111 mmHg — ABNORMAL HIGH (ref 83.0–108.0)

## 2018-06-08 LAB — TROPONIN I
Troponin I: 0.03 ng/mL (ref <0.03)
Troponin I: 0.04 ng/mL (ref <0.03)

## 2018-06-08 LAB — PROCALCITONIN: Procalcitonin: <0.1 ng/mL

## 2018-06-08 LAB — LACTIC ACID, PLASMA
Lactic Acid, Venous: 2.1 mmol/L (ref 0.5–1.9)
Lactic Acid, Venous: 1.7 mmol/L (ref 0.5–1.9)

## 2018-06-08 LAB — MAGNESIUM: Magnesium: 2 mg/dL (ref 1.7–2.4)

## 2018-06-08 LAB — TRIGLYCERIDES: Triglycerides: 122 mg/dL (ref <150)

## 2018-06-08 LAB — PHOSPHORUS: Phosphorus: 2.8 mg/dL (ref 2.5–4.6)

## 2018-06-08 MED ORDER — FUROSEMIDE 20 MG PO TABS
20.0000 mg | ORAL_TABLET | Freq: Every day | ORAL | Status: DC
  Administered 2018-06-08 – 2018-06-09 (×2): 20 mg
  Filled 2018-06-08 (×2): qty 1

## 2018-06-08 MED ORDER — LOSARTAN POTASSIUM 50 MG PO TABS
50.0000 mg | ORAL_TABLET | Freq: Every day | ORAL | Status: DC
  Administered 2018-06-09 – 2018-06-11 (×3): 50 mg
  Filled 2018-06-08 (×4): qty 1

## 2018-06-08 MED ORDER — ASPIRIN 81 MG PO CHEW
81.0000 mg | CHEWABLE_TABLET | Freq: Every day | ORAL | Status: DC
  Administered 2018-06-08 – 2018-06-11 (×4): 81 mg
  Filled 2018-06-08 (×4): qty 1

## 2018-06-08 MED ORDER — ACETAMINOPHEN 500 MG PO TABS
1000.0000 mg | ORAL_TABLET | Freq: Three times a day (TID) | ORAL | Status: DC | PRN
  Administered 2018-06-11: 1000 mg
  Filled 2018-06-08: qty 2

## 2018-06-08 MED ORDER — MIRTAZAPINE 15 MG PO TABS
15.0000 mg | ORAL_TABLET | Freq: Every day | ORAL | Status: DC
  Administered 2018-06-08 – 2018-06-10 (×3): 15 mg
  Filled 2018-06-08 (×3): qty 1

## 2018-06-08 MED ORDER — PROPOFOL 10 MG/ML IV BOLUS
0.0000 ug | Freq: Once | INTRAVENOUS | Status: AC
  Administered 2018-06-07: 70 ug via INTRAVENOUS

## 2018-06-08 MED ORDER — INSULIN ASPART 100 UNIT/ML IV SOLN
10.0000 [IU] | Freq: Once | INTRAVENOUS | Status: AC
  Administered 2018-06-08: 10 [IU] via INTRAVENOUS

## 2018-06-08 MED ORDER — ETOMIDATE 2 MG/ML IV SOLN: 20.0000 mg | Freq: Once | INTRAVENOUS | Status: DC

## 2018-06-08 MED ORDER — ETOMIDATE 2 MG/ML IV SOLN
20.0000 mg | Freq: Once | INTRAVENOUS | Status: AC
  Administered 2018-06-07: 20 mg via INTRAVENOUS

## 2018-06-08 MED ORDER — FUROSEMIDE 10 MG/ML IJ SOLN
40.0000 mg | Freq: Once | INTRAMUSCULAR | Status: AC
  Administered 2018-06-08: 40 mg via INTRAVENOUS
  Filled 2018-06-08: qty 4

## 2018-06-08 MED ORDER — ROCURONIUM BROMIDE 50 MG/5ML IV SOLN
1.0000 mg/kg | Freq: Once | INTRAVENOUS | Status: AC
  Administered 2018-06-07: 50 mg via INTRAVENOUS

## 2018-06-08 MED ORDER — ATORVASTATIN CALCIUM 10 MG PO TABS
20.0000 mg | ORAL_TABLET | Freq: Every day | ORAL | Status: DC
  Administered 2018-06-08 – 2018-06-10 (×3): 20 mg
  Filled 2018-06-08 (×3): qty 2

## 2018-06-08 MED ORDER — CELECOXIB 200 MG PO CAPS
200.0000 mg | ORAL_CAPSULE | Freq: Two times a day (BID) | ORAL | Status: DC
  Administered 2018-06-08 – 2018-06-11 (×6): 200 mg
  Filled 2018-06-08 (×8): qty 1

## 2018-06-08 MED ORDER — CYCLOBENZAPRINE HCL 10 MG PO TABS: 10.0000 mg | ORAL_TABLET | Freq: Three times a day (TID) | ORAL | Status: DC | PRN

## 2018-06-08 MED ORDER — LIDOCAINE HCL (PF) 2 % IJ SOLN
100.0000 mg | Freq: Once | INTRAMUSCULAR | Status: AC
  Administered 2018-06-07: 100 mg

## 2018-06-08 MED ORDER — FAMOTIDINE IN NACL 20-0.9 MG/50ML-% IV SOLN
20.0000 mg | Freq: Two times a day (BID) | INTRAVENOUS | Status: DC
  Administered 2018-06-08 – 2018-06-11 (×7): 20 mg via INTRAVENOUS
  Filled 2018-06-08 (×7): qty 50

## 2018-06-08 NOTE — Progress Notes (Signed)
CCMD notified of patient in consistent ventricular bigeminy. Labs ordered. RN will continue to monitor.

## 2018-06-08 NOTE — Progress Notes (Signed)
Subjective: Patient reports intubated.  Nods to questions and follows commands.  Objective: Vital signs in last 24 hours: Temp:  [97.2 F (36.2 C)-99 F (37.2 C)] 99 F (37.2 C) (12/21 0800) Pulse Rate:  [41-138] 96 (12/21 0900) Resp:  [0-31] 0 (12/21 0700) BP: (100-176)/(46-114) 118/55 (12/21 0900) SpO2:  [92 %-100 %] 94 % (12/21 0900) FiO2 (%):  [30 %-100 %] 70 % (12/21 0803) Weight:  [154.1 kg] 154.1 kg (12/21 0000)  Intake/Output from previous day: 12/20 0701 - 12/21 0700 In: 3417.7 [I.V.:3267.7; IV Piggyback:50] Out: 1825 [Urine:1175; Blood:650] Intake/Output this shift: No intake/output data recorded.  Physical Exam: Dressing CDI.  MAE to command.  Lab Results: Recent Labs    06/08/18 0026 06/08/18 0335  WBC 12.3* 10.7*  HGB 12.4* 12.7*  HCT 39.2 40.6  PLT 155 154   BMET Recent Labs    06/07/18 1920 06/08/18 0026  NA 141 138  K 4.2 5.8*  CL 100 95*  CO2 24 33*  GLUCOSE 223* 242*  BUN 20 21  CREATININE 0.91 0.95  CALCIUM 8.9 8.8*    Studies/Results: Dg Thoracic Spine 1 View  Result Date: 06/07/2018 CLINICAL DATA:  Spinal stenosis EXAM: OPERATIVE THORACIC SPINE 1 VIEW(S) COMPARISON:  04/25/2017 FINDINGS: Tissue spreaders are in place posteriorly at T11-12. Probe marks this level. IMPRESSION: T11-12 localized. Electronically Signed   By: Paulina FusiMark  Shogry M.D.   On: 06/07/2018 15:32   Portable Chest Xray  Result Date: 06/08/2018 CLINICAL DATA:  Endotracheal tube placement. EXAM: PORTABLE CHEST 1 VIEW COMPARISON:  06/07/2018 FINDINGS: Endotracheal tube appears to be approximately 6 cm above the carina, although the carina is not well visualized. Enteric tube courses into the region of the stomach and off the inferior portion of the film as tip is not visualized. Lungs are hypoinflated as patient is mildly rotated to the right. There is persistent mild bibasilar opacification likely atelectasis and possible small amount right pleural fluid unchanged. Mild  stable cardiomegaly. Remainder of the exam is unchanged. IMPRESSION: Stable mild bibasilar opacification likely atelectasis with small amount right pleural fluid. Mild stable cardiomegaly. Tubes and lines as described. Electronically Signed   By: Elberta Fortisaniel  Boyle M.D.   On: 06/08/2018 08:42   Portable Chest X-ray  Result Date: 06/07/2018 CLINICAL DATA:  ET and OG tube plmt EXAM: PORTABLE CHEST 1 VIEW COMPARISON:  None. FINDINGS: Tracheal tube 4 cm from carina in good position. Low lung volumes. Basilar atelectasis. No pulmonary edema. NG tube extends the stomach. IMPRESSION: Endotracheal tube in good position.  NG tube in stomach. Low lung volumes. Electronically Signed   By: Genevive BiStewart  Edmunds M.D.   On: 06/07/2018 21:50    Assessment/Plan: Patient is intubated and wean per CCM.  Moves extremities to command.  Continue supportive care.    LOS: 1 day    Dorian HeckleJoseph D Shelisa Fern, MD 06/08/2018, 10:00 AM

## 2018-06-08 NOTE — Progress Notes (Signed)
CRITICAL VALUE ALERT  Critical Value:  Lactic 2.6  Date & Time Notied:  06/07/2018 1930  Provider Notified: CCMD at bedside   Orders Received/Actions taken: LR maintenance fluids at 125

## 2018-06-08 NOTE — Progress Notes (Signed)
PT Cancellation Note  Patient Details Name: Travis EavesBuford L Crymes MRN: [id number] DOB: [birth year]   Cancelled Treatment:    Reason Eval/Treat Not Completed: Medical issues which prohibited therapy. Pt intubated, not yet appropriate   Kember Boch B Srinika Delone 06/08/2018, 7:35 AM  Delaney MeigsMaija Tabor Doreen Garretson, PT Acute Rehabilitation Services Pager: [phone] Office: [phone]

## 2018-06-08 NOTE — Progress Notes (Signed)
CRITICAL VALUE ALERT  Critical Value:  Lactic 2.1  Date & Time Notied:  06/08/2018 2310  Provider Notified: ccmd  Orders Received/Actions taken: continue to monitor

## 2018-06-08 NOTE — Progress Notes (Signed)
Neurosurgery updated on patient being intubated. RN will continue to monitor.

## 2018-06-08 NOTE — Progress Notes (Addendum)
NAME:  [name], MRN:  [id number], DOB:  [birth year], LOS: 1 ADMISSION DATE:  06/07/2018, CONSULTATION DATE:  06/07/2018 REFERRING MD:  Jordan LikesPool, CHIEF COMPLAINT:  Post op sedation   Brief History   77 year old male with previous spinal surgeries admitted with severe spinal stenosis from T11-T12.  Underwent T11-T12 decompressive laminectomy on 12/20.  Postoperatively he had uncontrolled pain and obstructed airway and was placed on BiPAP with concern for intubation needed.  PCCM consulted. Underwent intubation on 12/20 and placed on ventilator. Postop developed atrial fibrillation with RVR and started on Cardizem drip.  Past Medical History  Medical noncompliance, sleep apnea on CPAP, hypertension, atrial fibrillation, and surgical back/spine surgeries, aortic aneurysm, arthritis, depression, hepatitis B  Significant Hospital Events   12/20 admit, to OR for t11-t12 decompressive laminectomy.  06/07/2018 postoperative respiratory failure requiring intubation 12/ 21 hyperkalemia  Consults:  06/07/2018 pulmonary critical care  Procedures:  12/20 T11-T12 decompressive laminectomy 06/07/2018 at 2100 hrs. urgent intubation Significant Diagnostic Tests:  MRI thoracic spine 9/17 > advanced T11-T12 degenerative disease with cord compression and foraminal impingement.   Micro Data:    Antimicrobials:  Periop cefazolin and vanco  Interim history/subjective:  Noted to be hyperkalemic.  Required intubation during the night.  Currently on propofol drip.  Objective   Blood pressure (!) 122/46, pulse (!) 44, temperature 99.3 F (37.4 C), temperature source Axillary, resp. rate (!) 23, weight (!) 154.1 kg, SpO2 95 %.    Vent Mode: PRVC FiO2 (%):  [30 %-100 %] 60 % Set Rate:  [15 bmp] 15 bmp Vt Set:  [540 mL] 540 mL PEEP:  [5 cmH20] 5 cmH20 Plateau Pressure:  [13 cmH20-22 cmH20] 20 cmH20   Intake/Output Summary (Last 24 hours) at 06/08/2018 1329 Last data filed at 06/08/2018  1200 Gross per 24 hour  Intake 4251.92 ml  Output 2290 ml  Net 1961.92 ml   Filed Weights   06/07/18 2000 06/08/18 0000  Weight: (!) 154.1 kg (!) 154.1 kg    Examination: General: Morbidly obese male sedated on the vent HEENT: Tracheal tube in place Neuro: Sedated with propofol and intermittent fentanyl.  Not follow commands at this time CV: Sounds are irregular, frequent premature ventricular contractions are noted, now off diltiazem drip PULM: Decreased air movement bilaterally GI: Obese, nontender, faint bowel sounds Extremities: warm/dry, 2+ edema  Skin: no rashes or lesion.  Surgical incision site unremarkable   Resolved Hospital Problem list     Assessment & Plan:   At risk poor airway protection the postoperative setting.  Dependent respiratory failure secondary to combination of morbid obesity, obstructive sleep apnea status post thoracic spine surgery.  Intubated during the night by the critical care team -Continue current settings -Stat ABG -Stat portable chest x-ray  Hyperkalemia -Lasix -IV insulin -Repeat bmet -Creatinine and BUN noted to be within normal limits  Thoracic spine stenosis s/p T11-T12 decompressive laminectomy.  -Per neurosurgery  Atrial fibrillation with RVR history of atrial fib as well.  06/08/2018 currently in or appears to be sinus rhythm with frequent PVCs - telemetry monitoring -Cardizem drip is off -Stop IV fluid hydration and work towards negative intake and output  Chronic HFrEF and dilated ascending aorta by Echo at [facility], we do not have records of these.  2D echo on 08/27/2015 shows EF of 55 to 60% poor study -Decrease IV fluids to 75 cc an hour -Increased dose of Lasix -Try to achieve a negative intake and output.  Post op pain management -Cover with  sedation for intubation with goal of RA SS 0 to -1 -Once extubated will need pain medicine   Best practice:  Diet: NPO Pain/Anxiety/Delirium protocol (if indicated):  Sedation with propofol fentanyl for tube tolerance RA SS score -1 VAP protocol (if indicated): Yes DVT prophylaxis: Yes GI prophylaxis: Yes Glucose control: Sliding scale insulin Mobility: BR Code Status: FULL Family Communication: 06/08/2018 no family bedside Disposition: ICU  Labs   CBC: Recent Labs  Lab 06/08/18 0026 06/08/18 0335  WBC 12.3* 10.7*  NEUTROABS  --  9.6*  HGB 12.4* 12.7*  HCT 39.2 40.6  MCV 94.2 93.5  PLT 155 154    Basic Metabolic Panel: Recent Labs  Lab 06/07/18 1920 06/08/18 0026  NA 141 138  K 4.2 5.8*  CL 100 95*  CO2 24 33*  GLUCOSE 223* 242*  BUN 20 21  CREATININE 0.91 0.95  CALCIUM 8.9 8.8*  MG  --  2.0  PHOS  --  2.8   GFR: Estimated Creatinine Clearance: 94.6 mL/min (by C-G formula based on SCr of 0.95 mg/dL). Recent Labs  Lab 06/07/18 1920 06/07/18 2309 06/08/18 0026 06/08/18 0335  PROCALCITON <0.10  --   --  <0.10  WBC  --   --  12.3* 10.7*  LATICACIDVEN 2.6* 2.1*  --   --     Liver Function Tests: Recent Labs  Lab 06/07/18 1920  AST 33  ALT 22  ALKPHOS 66  BILITOT 0.7  PROT 6.7  ALBUMIN 3.3*   No results for input(s): LIPASE, AMYLASE in the last 168 hours. No results for input(s): AMMONIA in the last 168 hours.  ABG    Component Value Date/Time   PHART 7.304 (L) 06/07/2018 2130   PCO2ART 59.6 (H) 06/07/2018 2130   PO2ART 109 (H) 06/07/2018 2130   HCO3 28.7 (H) 06/07/2018 2130   TCO2 28 10/18/2015 0910   ACIDBASEDEF 0.2 08/27/2015 1016   O2SAT 98.1 06/07/2018 2130     Coagulation Profile: No results for input(s): INR, PROTIME in the last 168 hours.  Cardiac Enzymes: Recent Labs  Lab 06/07/18 1920 06/08/18 0200  TROPONINI <0.03 0.03*    HbA1C: Hgb A1c MFr Bld  Date/Time Value Ref Range Status  07/04/2007 12:55 AM   Final   5.6 (NOTE)   The ADA recommends the following therapeutic goals for glycemic   control related to Hgb A1C measurement:   Goal of Therapy:   < 7.0% Hgb A1C   Action Suggested:   > 8.0% Hgb A1C   Ref:  Diabetes Care, 22, Suppl. 1, 1999    CBG: Recent Labs  Lab 06/07/18 1842 06/07/18 2359 06/08/18 0340 06/08/18 0817 06/08/18 1149  GLUCAP 204* 211* 200* 163* 179*      App cct 60 min  Brett Canales Minor ACNP Adolph Pollack PCCM Pager [phone] till 1 pm If no answer page [phone]- [phone] 06/08/2018, 1:30 PM    Attending note: I have seen and examined the patient. History, labs and imaging reviewed.  Intubated overnight for worsening respiratory failure.  Remains on the ventilator Labs significant for hyperkalemia.  Blood pressure (!) 115/58, pulse (!) 44, temperature 99.3 F (37.4 C), temperature source Axillary, resp. rate 18, weight (!) 154.1 kg, SpO2 98 %. Gen:     Obese HEENT:  EOMI, sclera anicteric, ET tube Neck:     No masses; no thyromegaly Lungs:    Clear to auscultation bilaterally; normal respiratory effort CV:         Regular rate and rhythm; no  murmurs Abd:      + bowel sounds; soft, non-tender; no palpable masses, no distension Ext:    No edema; adequate peripheral perfusion Skin:      Warm and dry; no rash Neuro: Sedated, unresponsive  Labs reviewed, significant for BUN/creatinine 21/0.95, potassium 5.8, sodium 138, troponin 0 0.03 Procalcitonin less than 0.1 WBC 10.7, hemoglobin 12.7, platelets 154  Imaging Chest x-ray 06/08/2018- mild bibasilar opacification, atelectasis, cardiomegaly I have reviewed the images personally.  Assessment/plan: [age] year old with postop respiratory failure, atrial fibrillation after spinal surgery  Respiratory failure History of sleep apnea, obesity Keep on the ventilator Follow ABG, chest x-ray  Hyperkalemia Lasix, insulin Follow blood sugars Repeat labs  Atrial fibrillation with RVR Diastolic Heart failure. Cardizem drip is off. Tele monitoring Stop IV fluids.   Diuresis  The patient is critically ill with multiple organ systems failure and requires high complexity decision making for  assessment and support, frequent evaluation and titration of therapies, application of advanced monitoring technologies and extensive interpretation of multiple databases.  Critical care time - 35 mins. This represents my time independent of the NPs time taking care of the pt.  Chilton GreathousePraveen Gerardo Caiazzo MD [facility] 06/08/2018, 2:05 PM

## 2018-06-08 NOTE — Progress Notes (Signed)
Initial Nutrition Assessment  DOCUMENTATION CODES:  Morbid obesity  INTERVENTION:  If unable to extubate within 24-48 hrs, recommend TF. Appropriate regimen based on current parameters would be as follows:   Vital High Protein at goal rate of 50 ml/h (1200 ml per day) and Prostat 30 ml TID to provide 1500 kcals (+243 from propofol) , 150 gm protein, 1003 ml free water daily.  NUTRITION DIAGNOSIS:  Inadequate oral intake related to inability to eat as evidenced by NPO status.  GOAL:  Provide needs based on ASPEN/SCCM guidelines  MONITOR:  Diet advancement, Vent status, Weight trends, Labs, I & O's, TF tolerance  REASON FOR ASSESSMENT:  Ventilator    ASSESSMENT:  77 y/o male PMHx HTN, Depression, morbid obesity, OSA, Afib, HF. and numerous prior spinal surgeries. Has had increasing sensory loss + pain in BLE r/t  spinal stenosis s/p decompression 12/20. Extubated postop, but developed Afib and Acute resp failure necessitating reintubation.   Pt intubated, sedated on RD arrival. No family present. Unable to obtain any intake or weight history.   Per chart, it appears pt has gained a large amount of weight over the past several years. At his preoperative evaluation on 12/2, he was 338.4 lbs. He was under 300 lbs in 2018 and under 280 in early 2017.   Will leave TF recommendations in event he is unable to extubate in next 24-48 hrs .   Patient is currently intubated on ventilator support MV:7.7 L/min Temp (24hrs), Avg:98.6 F (37 C), Min:98.1 F (36.7 C), Max:99.3 F (37.4 C) Propofol: 9.2 ml/hr = 243 kcals   Labs: K:5.8- > 4.5, phos, mag, na: wdl, TG 122, Bgs 150-200 Meds: D3, insulin, Remeron, MVI -prosight, ivf, IV abx Sedation/Analgesia: Fentanyl, Propofol  Recent Labs  Lab 06/07/18 1920 06/08/18 0026 06/08/18 1357  NA 141 138 136  K 4.2 5.8* 4.5  CL 100 95* 96*  CO2 24 33* 31  BUN 20 21 20   CREATININE 0.91 0.95 0.87  CALCIUM 8.9 8.8* 8.7*  MG  --  2.0  --    PHOS  --  2.8  --   GLUCOSE 223* 242* 173*   NUTRITION - FOCUSED PHYSICAL EXAM:   Most Recent Value  Orbital Region  No depletion  Upper Arm Region  No depletion  Thoracic and Lumbar Region  No depletion  Buccal Region  No depletion  Temple Region  No depletion  Clavicle Bone Region  No depletion  Clavicle and Acromion Bone Region  No depletion  Scapular Bone Region  Unable to assess  Dorsal Hand  No depletion  Patellar Region  No depletion  Anterior Thigh Region  No depletion  Posterior Calf Region  No depletion  Edema (RD Assessment)  None  Hair  Reviewed  Eyes  Reviewed  Mouth  Reviewed  Skin  Reviewed  Nails  Reviewed     Diet Order:   Diet Order            Diet NPO time specified  Diet effective now             EDUCATION NEEDS:  Not appropriate for education at this time  Skin:  Cellulitis R leg, MASD to groin, Surgical incision to back   Last BM:  Unknown  Height:  Ht Readings from Last 1 Encounters:  05/27/18 5\' 8"  (1.727 m)   Weight:  Wt Readings from Last 1 Encounters:  06/08/18 (!) 154.1 kg   Wt Readings from Last 10 Encounters:  06/08/18 Marland Kitchen(!)  154.1 kg  05/27/18 (!) 153.3 kg  10/18/15 122.5 kg  08/27/15 129.2 kg  01/03/12 97.2 kg   Ideal Body Weight:  70 kg  BMI:  Body mass index is 51.66 kg/m.  Estimated Nutritional Needs:  Kcal:  1540-1750 kcals (22-25 kcal/kg ibw) Protein:  >140g Pro (2g/kg ibw) Fluid:  Per MD goals  Christophe LouisNathan Franks RD, LDN, CNSC Clinical Nutrition Available Tues-Sat via Pager: [phone] 06/08/2018 6:06 PM

## 2018-06-09 ENCOUNTER — Inpatient Hospital Stay (HOSPITAL_COMMUNITY): Payer: Medicare HMO

## 2018-06-09 LAB — PHOSPHORUS: Phosphorus: 3.4 mg/dL (ref 2.5–4.6)

## 2018-06-09 LAB — GLUCOSE, CAPILLARY
Glucose-Capillary: 127 mg/dL — ABNORMAL HIGH (ref 70–99)
Glucose-Capillary: 137 mg/dL — ABNORMAL HIGH (ref 70–99)
Glucose-Capillary: 151 mg/dL — ABNORMAL HIGH (ref 70–99)
Glucose-Capillary: 153 mg/dL — ABNORMAL HIGH (ref 70–99)
Glucose-Capillary: 154 mg/dL — ABNORMAL HIGH (ref 70–99)
Glucose-Capillary: 159 mg/dL — ABNORMAL HIGH (ref 70–99)

## 2018-06-09 LAB — BLOOD GAS, ARTERIAL
Acid-Base Excess: 8.3 mmol/L — ABNORMAL HIGH (ref 0.0–2.0)
Bicarbonate: 33.5 mmol/L — ABNORMAL HIGH (ref 20.0–28.0)
Drawn by: 441351
FIO2: 40
MECHVT: 540 mL
O2 SAT: 92.5 %
PEEP: 5 cmH2O
Patient temperature: 98.6
RATE: 15 resp/min
pCO2 arterial: 58.1 mmHg — ABNORMAL HIGH (ref 32.0–48.0)
pH, Arterial: 7.379 (ref 7.350–7.450)
pO2, Arterial: 65.9 mmHg — ABNORMAL LOW (ref 83.0–108.0)

## 2018-06-09 LAB — BASIC METABOLIC PANEL
Anion gap: 11 (ref 5–15)
BUN: 24 mg/dL — ABNORMAL HIGH (ref 8–23)
CO2: 30 mmol/L (ref 22–32)
Calcium: 8.9 mg/dL (ref 8.9–10.3)
Chloride: 97 mmol/L — ABNORMAL LOW (ref 98–111)
Creatinine, Ser: 0.83 mg/dL (ref 0.61–1.24)
GFR calc Af Amer: >60 mL/min (ref >60)
GFR calc non Af Amer: >60 mL/min (ref >60)
GLUCOSE: 159 mg/dL — AB (ref 70–99)
Potassium: 4.3 mmol/L (ref 3.5–5.1)
Sodium: 138 mmol/L (ref 135–145)

## 2018-06-09 LAB — CBC WITH DIFFERENTIAL/PLATELET
ABS IMMATURE GRANULOCYTES: 0.05 10*3/uL (ref 0.00–0.07)
BASOS PCT: 0 %
Basophils Absolute: 0 10*3/uL (ref 0.0–0.1)
Eosinophils Absolute: 0 10*3/uL (ref 0.0–0.5)
Eosinophils Relative: 0 %
HCT: 37.8 % — ABNORMAL LOW (ref 39.0–52.0)
Hemoglobin: 11.8 g/dL — ABNORMAL LOW (ref 13.0–17.0)
Immature Granulocytes: 1 %
Lymphocytes Relative: 12 %
Lymphs Abs: 1 10*3/uL (ref 0.7–4.0)
MCH: 30 pg (ref 26.0–34.0)
MCHC: 31.2 g/dL (ref 30.0–36.0)
MCV: 96.2 fL (ref 80.0–100.0)
Monocytes Absolute: 0.9 10*3/uL (ref 0.1–1.0)
Monocytes Relative: 10 %
Neutro Abs: 6.6 10*3/uL (ref 1.7–7.7)
Neutrophils Relative %: 77 %
Platelets: 156 10*3/uL (ref 150–400)
RBC: 3.93 MIL/uL — ABNORMAL LOW (ref 4.22–5.81)
RDW: 14.1 % (ref 11.5–15.5)
WBC: 8.6 10*3/uL (ref 4.0–10.5)
nRBC: 0 % (ref 0.0–0.2)

## 2018-06-09 LAB — MAGNESIUM: Magnesium: 2 mg/dL (ref 1.7–2.4)

## 2018-06-09 LAB — PROCALCITONIN: Procalcitonin: <0.1 ng/mL

## 2018-06-09 MED ORDER — FUROSEMIDE 10 MG/ML IJ SOLN
40.0000 mg | Freq: Two times a day (BID) | INTRAMUSCULAR | Status: DC
  Administered 2018-06-09 – 2018-06-14 (×11): 40 mg via INTRAVENOUS
  Filled 2018-06-09 (×12): qty 4

## 2018-06-09 NOTE — Progress Notes (Signed)
OT Cancellation Note  Patient Details Name: [name] MRN: [id number] DOB: 04-May-1941   Cancelled Treatment:    Reason Eval/Treat Not Completed: Medical issues which prohibited therapy(Pt intubated. RN request hold. Will return as schedule allows and pt medically appropriate. Thank you.)  Ciarra Braddy M Shantese Raven Marlean Mortell MSOT, OTR/L Acute Rehab Pager: [phone] Office: [phone] 06/09/2018, 1:26 PM

## 2018-06-09 NOTE — Progress Notes (Signed)
Patient ID: [name] L Cardell, male   DOB: [birth year], 77 y.o.   MRN: [id number] Patient is on ventilatory assistance status post laminectomy T11-T12 for lumbar decompression Postoperatively the patient developed respiratory instability with long ventilatory pauses after some small doses of narcotic analgesic It seems that he had been experiencing some severe sleep apnea at home and was undergoing work-up for this at the time of his surgical admission He still seems to trigger the vent only every 30 to 60 seconds Critical care is helping manage ventilatory parameters When he arouses he moves all extremities He has required some increased sedation recently because of agitation

## 2018-06-09 NOTE — Progress Notes (Signed)
NAME:  [name] L Standiford, MRN:  [id number], DOB:  October 13, 1940, LOS: 2 ADMISSION DATE:  06/07/2018, CONSULTATION DATE:  06/07/2018 REFERRING MD:  Jordan LikesPool, CHIEF COMPLAINT:  Post op sedation   Brief History   77 year old male with previous spinal surgeries admitted with severe spinal stenosis from T11-T12.  Underwent T11-T12 decompressive laminectomy on 12/20.  Postoperatively he had uncontrolled pain and obstructed airway and was placed on BiPAP with concern for intubation needed.  PCCM consulted. Underwent intubation on 12/20 and placed on ventilator. Postop developed atrial fibrillation with RVR and started on Cardizem drip.  Past Medical History  Medical noncompliance, sleep apnea on CPAP, hypertension, atrial fibrillation, and surgical back/spine surgeries, aortic aneurysm, arthritis, depression, hepatitis B  Significant Hospital Events   12/20 admit, to OR for t11-t12 decompressive laminectomy.  06/07/2018 postoperative respiratory failure requiring intubation 12/ 21 hyperkalemia  Consults:  06/07/2018 pulmonary critical care  Procedures:  12/20 T11-T12 decompressive laminectomy 06/07/2018 at 2100 hrs. urgent intubation Significant Diagnostic Tests:  MRI thoracic spine 9/17 > advanced T11-T12 degenerative disease with cord compression and foraminal impingement.   Micro Data:    Antimicrobials:  Periop cefazolin and vanco  Interim history/subjective:  More awake.  Questionable if ready for attempted extubation.  Objective   Blood pressure (!) 125/98, pulse 92, temperature 99.1 F (37.3 C), temperature source Axillary, resp. rate 17, weight (!) 154.1 kg, SpO2 96 %.    Vent Mode: PRVC FiO2 (%):  [30 %-60 %] 30 % Set Rate:  [15 bmp] 15 bmp Vt Set:  [540 mL] 540 mL PEEP:  [5 cmH20] 5 cmH20 Plateau Pressure:  [17 cmH20-24 cmH20] 24 cmH20   Intake/Output Summary (Last 24 hours) at 06/09/2018 [id number] Last data filed at 06/09/2018 0700 Gross per 24 hour  Intake 2159.39 ml    Output 1955 ml  Net 204.39 ml   Filed Weights   06/07/18 2000 06/08/18 0000  Weight: (!) 154.1 kg (!) 154.1 kg    Examination: General: Morbidly obese male sedated on vent HEENT: Short neck no JVD or lymphadenopathy is appreciated endotracheal tube in position Neuro: Opens eyes follows commands moves all extremities CV: Heart sounds are distant, frequent PVCs but less than 24 hours ago PULM: Decreased breath sounds throughout GI: Obese, positive bowel sounds Extremities: warm/dry, 2+ edema  Skin: no rashes or lesions    Resolved Hospital Problem list     Assessment & Plan:   At risk poor airway protection the postoperative setting.  Dependent respiratory failure secondary to combination of morbid obesity, obstructive sleep apnea status post thoracic spine surgery.  Intubated during the night by the critical care team -Weaning if okay with surgery -May need to be extubated to BiPAP  Hyperkalemia resolved 06/09/2018 -Monitor electrolytes  Thoracic spine stenosis s/p T11-T12 decompressive laminectomy.  -Neurosurgery  Atrial fibrillation with RVR history of atrial fib as well.  06/08/2018 currently in or appears to be sinus rhythm with frequent PVCs -ICU monitoring -Not requiring diltiazem drip   Chronic HFrEF and dilated ascending aorta by Echo at [facility], we do not have records of these.  2D echo on 08/27/2015 shows EF of 55 to 60% poor study -Limit IV fluids - diuresis  Intake/Output Summary (Last 24 hours) at 06/09/2018 [id number] Last data filed at 06/09/2018 0700 Gross per 24 hour  Intake 2159.39 ml  Output 1955 ml  Net 204.39 ml     Post op pain management -Wean current sedation down for attempted spontaneous pressure -Once extubated he will need pain  medications will need to be balanced against his poor respiratory status.   Best practice:  Diet: NPO Pain/Anxiety/Delirium protocol (if indicated): Sedation with propofol fentanyl for tube tolerance RA SS  score -1 VAP protocol (if indicated): Yes DVT prophylaxis: Yes GI prophylaxis: Yes Glucose control: Sliding scale insulin Mobility: BR Code Status: FULL Family Communication: 06/08/2018 no family bedside Disposition: ICU  Labs   CBC: Recent Labs  Lab 06/08/18 0026 06/08/18 0335 06/08/18 1357 06/09/18 0436  WBC 12.3* 10.7* 10.4 8.6  NEUTROABS  --  9.6*  --  6.6  HGB 12.4* 12.7* 11.6* 11.8*  HCT 39.2 40.6 37.3* 37.8*  MCV 94.2 93.5 93.5 96.2  PLT 155 154 152 156    Basic Metabolic Panel: Recent Labs  Lab 06/07/18 1920 06/08/18 0026 06/08/18 1357 06/09/18 0436  NA 141 138 136 138  K 4.2 5.8* 4.5 4.3  CL 100 95* 96* 97*  CO2 24 33* 31 30  GLUCOSE 223* 242* 173* 159*  BUN 20 21 20  24*  CREATININE 0.91 0.95 0.87 0.83  CALCIUM 8.9 8.8* 8.7* 8.9  MG  --  2.0  --  2.0  PHOS  --  2.8  --  3.4   GFR: Estimated Creatinine Clearance: 108.3 mL/min (by C-G formula based on SCr of 0.83 mg/dL). Recent Labs  Lab 06/07/18 1920 06/07/18 2309 06/08/18 0026 06/08/18 0335 06/08/18 1357 06/09/18 0436  PROCALCITON <0.10  --   --  <0.10  --  <0.10  WBC  --   --  12.3* 10.7* 10.4 8.6  LATICACIDVEN 2.6* 2.1*  --   --  1.7  --     Liver Function Tests: Recent Labs  Lab 06/07/18 1920  AST 33  ALT 22  ALKPHOS 66  BILITOT 0.7  PROT 6.7  ALBUMIN 3.3*   No results for input(s): LIPASE, AMYLASE in the last 168 hours. No results for input(s): AMMONIA in the last 168 hours.  ABG    Component Value Date/Time   PHART 7.379 06/09/2018 0420   PCO2ART 58.1 (H) 06/09/2018 0420   PO2ART 65.9 (L) 06/09/2018 0420   HCO3 33.5 (H) 06/09/2018 0420   TCO2 36 (H) 06/08/2018 1432   ACIDBASEDEF 0.2 08/27/2015 1016   O2SAT 92.5 06/09/2018 0420     Coagulation Profile: No results for input(s): INR, PROTIME in the last 168 hours.  Cardiac Enzymes: Recent Labs  Lab 06/07/18 1920 06/08/18 0200 06/08/18 1357  TROPONINI <0.03 0.03* 0.04*    HbA1C: Hgb A1c MFr Bld  Date/Time  Value Ref Range Status  07/04/2007 12:55 AM   Final   5.6 (NOTE)   The ADA recommends the following therapeutic goals for glycemic   control related to Hgb A1C measurement:   Goal of Therapy:   < 7.0% Hgb A1C   Action Suggested:  > 8.0% Hgb A1C   Ref:  Diabetes Care, 22, Suppl. 1, 1999    CBG: Recent Labs  Lab 06/08/18 1557 06/08/18 1928 06/08/18 2316 06/09/18 0325 06/09/18 0808  GLUCAP 152* 139* 149* 153* 154*      App cct 34 min  Brett CanalesSteve Phylicia Mcgaugh ACNP [facility] Pager [phone] till 1 pm If no answer page [phone] 06/09/2018, 8:22 AM

## 2018-06-09 NOTE — Progress Notes (Signed)
PT Cancellation Note  Patient Details Name: [name] MRN: [id number] DOB: 07-09-1940   Cancelled Treatment:    Reason Eval/Treat Not Completed: Medical issues which prohibited therapy Pt still intubated, not yet appropriate. Will follow.   [name] 06/09/2018, 8:19 AM [name], PT, DPT Acute Rehabilitation Services Pager [phone] Office [phone]

## 2018-06-10 ENCOUNTER — Inpatient Hospital Stay (HOSPITAL_COMMUNITY): Payer: Medicare HMO

## 2018-06-10 LAB — CBC WITH DIFFERENTIAL/PLATELET
Abs Immature Granulocytes: 0.04 10*3/uL (ref 0.00–0.07)
Basophils Absolute: 0 10*3/uL (ref 0.0–0.1)
Basophils Relative: 0 %
Eosinophils Absolute: 0.1 10*3/uL (ref 0.0–0.5)
Eosinophils Relative: 1 %
HCT: 34.9 % — ABNORMAL LOW (ref 39.0–52.0)
Hemoglobin: 10.8 g/dL — ABNORMAL LOW (ref 13.0–17.0)
Immature Granulocytes: 1 %
Lymphocytes Relative: 12 %
Lymphs Abs: 0.9 10*3/uL (ref 0.7–4.0)
MCH: 29.6 pg (ref 26.0–34.0)
MCHC: 30.9 g/dL (ref 30.0–36.0)
MCV: 95.6 fL (ref 80.0–100.0)
Monocytes Absolute: 0.9 10*3/uL (ref 0.1–1.0)
Monocytes Relative: 12 %
Neutro Abs: 5.7 10*3/uL (ref 1.7–7.7)
Neutrophils Relative %: 74 %
PLATELETS: 138 10*3/uL — AB (ref 150–400)
RBC: 3.65 MIL/uL — ABNORMAL LOW (ref 4.22–5.81)
RDW: 14.6 % (ref 11.5–15.5)
WBC: 7.7 10*3/uL (ref 4.0–10.5)
nRBC: 0 % (ref 0.0–0.2)

## 2018-06-10 LAB — BASIC METABOLIC PANEL
Anion gap: 7 (ref 5–15)
BUN: 29 mg/dL — ABNORMAL HIGH (ref 8–23)
CO2: 33 mmol/L — ABNORMAL HIGH (ref 22–32)
Calcium: 8.6 mg/dL — ABNORMAL LOW (ref 8.9–10.3)
Chloride: 99 mmol/L (ref 98–111)
Creatinine, Ser: 0.98 mg/dL (ref 0.61–1.24)
GFR calc Af Amer: >60 mL/min (ref >60)
GLUCOSE: 149 mg/dL — AB (ref 70–99)
Potassium: 4.1 mmol/L (ref 3.5–5.1)
Sodium: 139 mmol/L (ref 135–145)

## 2018-06-10 LAB — GLUCOSE, CAPILLARY
Glucose-Capillary: 138 mg/dL — ABNORMAL HIGH (ref 70–99)
Glucose-Capillary: 146 mg/dL — ABNORMAL HIGH (ref 70–99)
Glucose-Capillary: 156 mg/dL — ABNORMAL HIGH (ref 70–99)
Glucose-Capillary: 136 mg/dL — ABNORMAL HIGH (ref 70–99)
Glucose-Capillary: 146 mg/dL — ABNORMAL HIGH (ref 70–99)
Glucose-Capillary: 127 mg/dL — ABNORMAL HIGH (ref 70–99)

## 2018-06-10 LAB — MAGNESIUM: Magnesium: 1.9 mg/dL (ref 1.7–2.4)

## 2018-06-10 LAB — TRIGLYCERIDES: Triglycerides: 164 mg/dL — ABNORMAL HIGH (ref <150)

## 2018-06-10 LAB — PHOSPHORUS: Phosphorus: 3.6 mg/dL (ref 2.5–4.6)

## 2018-06-10 NOTE — Progress Notes (Signed)
NAME:  [name], MRN:  [id number], DOB:  12-Aug-1940, LOS: 3 ADMISSION DATE:  06/07/2018, CONSULTATION DATE:  06/07/2018 REFERRING MD:  Jordan LikesPool, CHIEF COMPLAINT: Respiratory failure  Brief History   77 yo male with severe spinal stenosis had T11-T12 decompressive laminectomy 12/20.  Post op developed respiratory failure from pain medications in setting of OSA and required intubation.  Past Medical History  OSA, A fib, Thoracic aortic aneurysm, Diastolic CHF, Arthritis, depression, Hep B  Significant Hospital Events   12/20 admit, to OR for t11-t12 decompressive laminectomy 12/20 postoperative respiratory failure requiring intubation, A fib with RVR 12/21 hyperkalemia  Consults:    Procedures:  ETT 12/20 >>  Significant Diagnostic Tests:  MRI thoracic spine 9/17 > advanced T11-T12 degenerative disease with cord compression and foraminal impingement.   Micro Data:    Antimicrobials:    Interim history/subjective:  Remains on vent support and sedation.  Objective   Blood pressure 113/69, pulse 79, temperature 99.1 F (37.3 C), temperature source Axillary, resp. rate 17, weight (!) 154.1 kg, SpO2 97 %.    Vent Mode: PRVC FiO2 (%):  [30 %] 30 % Set Rate:  [15 bmp-16 bmp] 16 bmp Vt Set:  [540 mL] 540 mL PEEP:  [5 cmH20] 5 cmH20 Pressure Support:  [5 cmH20] 5 cmH20 Plateau Pressure:  [19 cmH20-26 cmH20] 20 cmH20   Intake/Output Summary (Last 24 hours) at 06/10/2018 0820 Last data filed at 06/10/2018 0600 Gross per 24 hour  Intake 1761.52 ml  Output 2225 ml  Net -463.48 ml   Filed Weights   06/07/18 2000 06/08/18 0000  Weight: (!) 154.1 kg (!) 154.1 kg    Examination:  General - sedated Eyes - pupils reactive ENT - ETT in place Cardiac - regular rate/rhythm, no murmur Chest - b/l rhonchi Abdomen - soft, non tender, + bowel sounds Extremities - 1+ edema Skin - no rashes Neuro - RASS -2  CXR 12/23 (reviewed by me) >> low lung volumes, interstitial  edema    Resolved Hospital Problem list   Hyperkalemia from acidosis  Assessment & Plan:   Acute on chronic hypoxic/hypercapnic respiratory failure in setting of severe OSA and opiate pain medications. Plan - limit sedation/opiate medications as able - try on pressure support to assess when he is ready for extubation trial - will need Bipap after extubation - goal SpO2 90 to 95% - f/u CXR intermitttently  Hx of CAD, A fib, Diastolic CHF, Thoracic aortic aneurysm, HLD. Plan [facility] - continue las[facility] - continue ASA, lipitor, cozaar  Spinal stenosis s/p T11-T12 decompressive laminectomy. Plan - post op care per neurosurgery  Acute metabolic encephalopathy. Hx of depression. Plan - RASS goal 0 - continue cymbalta, remeron  Anemia of chronic disease. Plan - f/u CBC  Hyperglycemia. Plan - SSI   Best practice:  Diet: NPO, tube feeds if unable to extubate soon DVT prophylaxis: SCD GI prophylaxis: Pepcid Mobility: bed rest Code Status: full code Family Communication: no family at bedside Disposition: ICU  Labs    CMP Latest Ref Rng & Units 06/10/2018 06/09/2018 06/08/2018  Glucose 70 - 99 mg/dL 409(W149(H) 119(J159(H) 478(G173(H)  BUN 8 - 23 mg/dL 95(A29(H) 21(H24(H) 20  Creatinine 0.61 - 1.24 mg/dL 0.860.98 5.780.83 4.690.87  Sodium 135 - 145 mmol/L 139 138 136  Potassium 3.5 - 5.1 mmol/L 4.1 4.3 4.5  Chloride 98 - 111 mmol/L 99 97(L) 96(L)  CO2 22 - 32 mmol/L 33(H) 30 31  Calcium 8.9 - 10.3  mg/dL 9.6(E8.6(L) 8.9 4.5(W8.7(L)  Total Protein 6.5 - 8.1 g/dL - - -  Total Bilirubin 0.3 - 1.2 mg/dL - - -  Alkaline Phos 38 - 126 U/L - - -  AST 15 - 41 U/L - - -  ALT 0 - 44 U/L - - -   CBC Latest Ref Rng & Units 06/10/2018 06/09/2018 06/08/2018  WBC 4.0 - 10.5 K/uL 7.7 8.6 10.4  Hemoglobin 13.0 - 17.0 g/dL 10.8(L) 11.8(L) 11.6(L)  Hematocrit 39.0 - 52.0 % 34.9(L) 37.8(L) 37.3(L)  Platelets 150 - 400 K/uL 138(L) 156 152   CBG (last 3)  Recent Labs    06/09/18 2330  06/10/18 0334 06/10/18 0802  GLUCAP 151* 138* 146*   ABG    Component Value Date/Time   PHART 7.379 06/09/2018 0420   PCO2ART 58.1 (H) 06/09/2018 0420   PO2ART 65.9 (L) 06/09/2018 0420   HCO3 33.5 (H) 06/09/2018 0420   TCO2 36 (H) 06/08/2018 1432   ACIDBASEDEF 0.2 08/27/2015 1016   O2SAT 92.5 06/09/2018 0420    CC time 33 minutes  Coralyn HellingVineet Zubin Pontillo, MD [facility] 06/10/2018, 8:33 AM

## 2018-06-10 NOTE — Progress Notes (Signed)
Postop day 3.  Patient remains intubated on ventilator.  Patient was weaned earlier this morning but became very agitated and required reinstitution of his sedation.  Patient will awaken and follow commands bilaterally.  Strength in both lower extremities appears reasonably good.  Wound is clean and dry.    Postoperative respiratory failure.  Continue efforts at ventilatory weaning and diuresis.  Overall appears to be doing reasonably well neurologically following surgery.

## 2018-06-10 NOTE — Progress Notes (Signed)
PT Cancellation Note  Patient Details Name: [name] MRN: [id number] DOB: 10/29/1940   Cancelled Treatment:    Reason Eval/Treat Not Completed: Other (comment). Pt in the process of weaning of vent and lifting sedation. RN asked to hold as pt will most likely be extubated today and will be able to participate more.  Lewis ShockAshly Wilene Pharo, PT, DPT Acute Rehabilitation Services Pager #: [phone] Office #: [phone]    [name] 06/10/2018, 10:05 AM

## 2018-06-10 NOTE — Progress Notes (Signed)
OT / PTCancellation Note  Patient Details Name: Kayren EavesBuford L Shipman MRN: [id number] DOB: 1941-01-28   Cancelled Treatment:    Reason Eval/Treat Not Completed: Patient not medically ready(intubated RN request to hold) RN Josh requesting therapy to hold for more appropriate time. OT/ PT to continue to check back.   Fontaine NoBrynn Aurelia Gras   Brynn Fidelis Loth, OTR/L  Acute Rehabilitation Services Pager: [phone] Office: [phone] .  06/10/2018, 8:07 AM

## 2018-06-11 LAB — CBC
HCT: 35.9 % — ABNORMAL LOW (ref 39.0–52.0)
Hemoglobin: 11.1 g/dL — ABNORMAL LOW (ref 13.0–17.0)
MCH: 29.4 pg (ref 26.0–34.0)
MCHC: 30.9 g/dL (ref 30.0–36.0)
MCV: 95 fL (ref 80.0–100.0)
NRBC: 0 % (ref 0.0–0.2)
Platelets: 137 10*3/uL — ABNORMAL LOW (ref 150–400)
RBC: 3.78 MIL/uL — AB (ref 4.22–5.81)
RDW: 14.4 % (ref 11.5–15.5)
WBC: 9.5 10*3/uL (ref 4.0–10.5)

## 2018-06-11 LAB — BASIC METABOLIC PANEL
Anion gap: 12 (ref 5–15)
BUN: 32 mg/dL — ABNORMAL HIGH (ref 8–23)
CHLORIDE: 97 mmol/L — AB (ref 98–111)
CO2: 31 mmol/L (ref 22–32)
Calcium: 8.6 mg/dL — ABNORMAL LOW (ref 8.9–10.3)
Creatinine, Ser: 0.92 mg/dL (ref 0.61–1.24)
GFR calc non Af Amer: >60 mL/min (ref >60)
Glucose, Bld: 149 mg/dL — ABNORMAL HIGH (ref 70–99)
Potassium: 3.8 mmol/L (ref 3.5–5.1)
Sodium: 140 mmol/L (ref 135–145)

## 2018-06-11 LAB — GLUCOSE, CAPILLARY
Glucose-Capillary: 158 mg/dL — ABNORMAL HIGH (ref 70–99)
Glucose-Capillary: 146 mg/dL — ABNORMAL HIGH (ref 70–99)
Glucose-Capillary: 151 mg/dL — ABNORMAL HIGH (ref 70–99)
Glucose-Capillary: 171 mg/dL — ABNORMAL HIGH (ref 70–99)

## 2018-06-11 LAB — MAGNESIUM: Magnesium: 2 mg/dL (ref 1.7–2.4)

## 2018-06-11 MED ORDER — ENOXAPARIN SODIUM 80 MG/0.8ML ~~LOC~~ SOLN
0.5000 mg/kg | SUBCUTANEOUS | Status: DC
  Administered 2018-06-11 – 2018-06-14 (×4): 75 mg via SUBCUTANEOUS
  Filled 2018-06-11 (×4): qty 0.8

## 2018-06-11 MED ORDER — FENTANYL CITRATE (PF) 100 MCG/2ML IJ SOLN
25.0000 ug | INTRAMUSCULAR | Status: DC | PRN
  Administered 2018-06-11 – 2018-06-13 (×10): 50 ug via INTRAVENOUS
  Administered 2018-06-14: 25 ug via INTRAVENOUS
  Administered 2018-06-14: 50 ug via INTRAVENOUS
  Filled 2018-06-11 (×12): qty 2

## 2018-06-11 MED ORDER — ORAL CARE MOUTH RINSE
15.0000 mL | Freq: Two times a day (BID) | OROMUCOSAL | Status: DC
  Administered 2018-06-12 – 2018-06-28 (×27): 15 mL via OROMUCOSAL

## 2018-06-11 NOTE — Progress Notes (Addendum)
OT Evaluation  PTA, pt lived at home with wife and required assistance for ADL and mobility due to weakness and pain. Pt was able to "pull up" with assistance to pivot to wc. Wife states pt has had a significant decline over the last 3 months. Pt will benefit form rehab at SNF. Will follow acutely. I have discussed the patient's current level of function related to mobility and ADL with the patient and his wife.  They acknowledge understanding of this and do not feel the patient would be able to have their care needs met at home.  They are interested in post-acute rehab.     06/11/18 1000  OT Visit Information  Last OT Received On 06/11/18  Assistance Needed +2  PT/OT/SLP Co-Evaluation/Treatment Yes  Reason for Co-Treatment Complexity of the patient's impairments (multi-system involvement)  PT goals addressed during session Mobility/safety with mobility  History of Present Illness 77 yo male with severe spinal stenosis had T11-T12 decompressive laminectomy 12/20.  Post op developed respiratory failure from pain medications in setting of OSA and required intubation. A fib, RVR; extubated 12/24.Patient status post operative fusion from L1-L5.  OMH: depression; Hepatits; Hepatitis B; HTN; lumbar neuralgia.  Precautions  Precautions Back  Precaution Booklet Issued No  Precaution Comments educated pt however no adherence  Restrictions  Weight Bearing Restrictions No  Home Living  Family/patient expects to be discharged to: Private residence  Living Arrangements Spouse/significant other  Available Help at Discharge Available 24 hours/day  Type of [place] to enter  Entrance Stairs-Number of Steps 2  Entrance Stairs-Rails None  Home Layout One level  Bathroom Shower/Tub Tub/shower unit  Bathroom Toilet Handicapped height  Home Equipment Tub bench;Grab bars - tub/shower;Walker - 2 wheels  Prior Function  Level of Independence Needs assistance  Gait / Transfers  Assistance Needed non-ambulatory due to pain; wife states he requires assistance to pull to stand and pivot into chair. Is able to walk a few steps ito toilet with min A.  ADL's / Homemaking Assistance Needed family assists with bathing and dressing. wife states he washes his face and does his "front part"  Communication / Swallowing Assistance Needed HOH  Communication  Communication HOH  Pain Assessment  Pain Assessment Faces  Faces Pain Scale 6  Pain Location back  Pain Descriptors / Indicators Grimacing  Pain Intervention(s) Limited activity within patient's tolerance  Cognition  Arousal/Alertness Lethargic  Behavior During Therapy Flat affect  Overall Cognitive Status No family/caregiver present to determine baseline cognitive functioning  General Comments slow to resopnd; inconsistently following 1 step commands  Upper Extremity Assessment  Upper Extremity Assessment Generalized weakness (unable to reach face with hands)  Lower Extremity Assessment  Lower Extremity Assessment Generalized weakness (grossly 3-/5, minimal active movement)  Cervical / Trunk Assessment  Cervical / Trunk Assessment Kyphotic;Other exceptions (back surgery)  ADL  Overall ADL's  Needs assistance/impaired  Grooming Maximal assistance;Wash/dry face;Oral care  Upper Body Bathing Maximal assistance;Sitting  Lower Body Bathing Total assistance;Bed level  Upper Body Dressing  Total assistance  Lower Body Dressing Total assistance  Functional mobility during ADLs  (not attempted)  Vision- History  Baseline Vision/History Wears glasses;Legally blind  Wears Glasses Reading only  Patient Visual Report Central vision impairment (MD R eye per wife)  Bed Mobility  Overal bed mobility Needs Assistance  Bed Mobility Rolling;Sidelying to Sit;Sit to Sidelying  Rolling Max assist;+2 for physical assistance  Sidelying to sit Total assist;+2 for physical assistance  Sit to  sidelying Total assist;+2 for physical  assistance  General bed mobility comments with max verbal cues pt wiht slight initiation of LEs toward EOB but unable to use UEs to assist  Transfers  General transfer comment unable to tolerate  Balance  Overall balance assessment Needs assistance  Sitting-balance support Feet supported;Bilateral upper extremity supported  Sitting balance-Leahy Scale Poor  Sitting balance - Comments pt with varying support from min -modA. pt with limited functional use of hands to support self, did progress to using trunk to bring self forward when pt beginning to posterior lean  Postural control Posterior lean  General Comments  General comments (skin integrity, edema, etc.) bloody dressing, RN changed it  OT - End of Session  Equipment Utilized During Treatment Oxygen (4L)  Activity Tolerance Patient limited by fatigue  Patient left in bed;with bed alarm set;with call bell/phone within reach (chair position)  Nurse Communication Mobility status;Need for lift equipment (use sky lift)  OT Assessment  OT Recommendation/Assessment Patient needs continued OT Services  OT Visit Diagnosis Unsteadiness on feet (R26.81);Other abnormalities of gait and mobility (R26.89);Muscle weakness (generalized) (M62.81);Pain;Other symptoms and signs involving cognitive function  Pain - part of body  (back)  OT Problem List Decreased strength;Decreased activity tolerance;Impaired balance (sitting and/or standing);Decreased cognition;Decreased safety awareness;Decreased knowledge of use of DME or AE;Decreased knowledge of precautions;Obesity;Pain;Impaired UE functional use;Increased edema;Impaired sensation  OT Plan  OT Frequency (ACUTE ONLY) Min 2X/week  OT Treatment/Interventions (ACUTE ONLY) Self-care/ADL training;Therapeutic exercise;DME and/or AE instruction;Therapeutic activities;Cognitive remediation/compensation;Patient/family education;Balance training  AM-PAC OT "6 Clicks" Daily Activity Outcome Measure (Version 2)   Help from another person eating meals? 2  Help from another person taking care of personal grooming? 2  Help from another person toileting, which includes using toliet, bedpan, or urinal? 1  Help from another person bathing (including washing, rinsing, drying)? 2  Help from another person to put on and taking off regular upper body clothing? 1  Help from another person to put on and taking off regular lower body clothing? 1  6 Click Score 9  OT Recommendation  Follow Up Recommendations SNF;Supervision/Assistance - 24 hour  OT Equipment 3 in 1 bedside commode  Individuals Consulted  Consulted and Agree with Results and Recommendations Patient;Family member/caregiver  Family Member Consulted wife - via phone  Acute Rehab OT Goals  Patient Stated Goal "get me out of here"  OT Goal Formulation Patient unable to participate in goal setting  Time For Goal Achievement 06/25/18  Potential to Achieve Goals Good  OT Time Calculation  OT Start Time (ACUTE ONLY) 0938  OT Stop Time (ACUTE ONLY) 1015  OT Time Calculation (min) 37 min  OT General Charges  $OT Visit 1 Visit  OT Evaluation  $OT Eval Moderate Complexity 1 Mod  Written Expression  Dominant Hand Right  Maurie Boettcher, OT/L   Acute OT Clinical Specialist Acute Rehabilitation Services Pager [phone] Office [phone]

## 2018-06-11 NOTE — Progress Notes (Signed)
Nutrition Follow-up  DOCUMENTATION CODES:   Morbid obesity  INTERVENTION:   Supplement diet once advanced as needed.    NUTRITION DIAGNOSIS:   Inadequate oral intake related to inability to eat as evidenced by NPO status. Ongoing.   GOAL:   Provide needs based on ASPEN/SCCM guidelines Progressing.   MONITOR:   Diet advancement, PO intake  ASSESSMENT:   Pt with PMH of HTN, Depression, morbid obesity, OSA, Afib, HF and numerous prior spinal surgeries. Has had increasing sensory loss + pain in BLE r/t  spinal stenosis s/p decompression 12/20. Extubated postop, but developed Afib and Acute resp failure necessitating reintubation.    Pt discussed during ICU rounds and with RN.  Pt working with PT currently. Per RN pt with thick copious secretions that resemble vanilla pudding. No prior problems swallowing. Will plan to check later today to determine plan for diet advanecment.   12/24 pt extubated Plan for CIR consult once ready.    Medications reviewed and include: vitamin D3, lasix, SSI, remeron Labs reviewed   Diet Order:   Diet Order            Diet NPO time specified  Diet effective now              EDUCATION NEEDS:   Not appropriate for education at this time  Skin:  Skin Assessment: Skin Integrity Issues: Skin Integrity Issues:: Other (Comment) Other: Cellulitis R leg, MASD to groin, Surgical incision to back   Last BM:  unknown  Height:   Ht Readings from Last 1 Encounters:  06/11/18 5\' 8"  (1.727 m)    Weight:   Wt Readings from Last 1 Encounters:  06/11/18 (!) 151 kg    Ideal Body Weight:  70 kg  BMI:  Body mass index is 50.62 kg/m.  Estimated Nutritional Needs:   Kcal:  1700-2150  Protein:  105-130 grams  Fluid:  2 L/day  Kendell BaneHeather Quasim Doyon RD, LDN, CNSC [phone] Pager [phone] After Hours Pager

## 2018-06-11 NOTE — Progress Notes (Signed)
Pt. No longer requiring fentanyl gtt. Wasted 40cc, witnessed by [place] Southern Territories[place], RN  Sherrie GeorgeMegan Nikelle Malatesta, RN BSN

## 2018-06-11 NOTE — Progress Notes (Signed)
CSW consult acknowledged for post discharge needs. CIR consulted. CSW will follow for PT recommendations, assist as needed.   Gerrie NordmannMichelle Barrett-Hilton, LCSW [phone]

## 2018-06-11 NOTE — Progress Notes (Addendum)
ANTICOAGULATION CONSULT NOTE  Pharmacy Consult for Lovenox Indication: VTE prophylaxis  Labs: Recent Labs    06/08/18 1357 06/09/18 0436 06/10/18 0301 06/11/18 0154  HGB 11.6* 11.8* 10.8* 11.1*  HCT 37.3* 37.8* 34.9* 35.9*  PLT 152 156 138* 137*  CREATININE 0.87 0.83 0.98 0.92  TROPONINI 0.04*  --   --   --     Assessment: 77 yom with severe spinal stenosis had T11-T12 decompressive laminectomy 12/20.  Post op developed respiratory failure and required intubation. Pharmacy consulted to dose Lovenox for VTE prophylaxis. CBC low but stable, SCr stable 0.9. No bleed documented. Not on anticoagulation PTA. SCDs for VTE prophylaxis currently. Diuresing with lasix - weight down to ~151kg today - will watch for changes. Post-op day 4.  Goal of Therapy:  VTE prevention Monitor platelets by anticoagulation protocol: Yes   Plan:  Start Lovenox 75mg  (~0.5 mg/kg) [place] q24h for BMI>30 Monitor CBC at least q72h, weight changes, s/sx bleeding  Babs BertinHaley Derran Sear, PharmD, BCPS Clinical Pharmacist 06/11/2018 8:07 AM

## 2018-06-11 NOTE — Evaluation (Signed)
Physical Therapy Evaluation Patient Details Name: [name] MRN: [id number] DOB: 1940-09-10 Today's Date: 06/11/2018   History of Present Illness  77 yo male with severe spinal stenosis had T11-T12 decompressive laminectomy 12/20.  Post op developed respiratory failure from pain medications in setting of OSA and required intubation. A fib, RVR; extubated 12/24.Patient status post operative fusion from L1-L5.  OMH: depression; Hepatits; Hepatitis B; HTN; lumbar neuralgia.  Clinical Impression  Patient is s/p above surgery resulting in the deficits listed below (see PT Problem List). Pt was w/c bound PTA due to pain and weakness. Pt now requiring max/total A for all mobility. SpO2 into 80s on 3LO2 via [place] during session. RN bumped pt up to 6Lo2 via [place]. Patient will benefit from skilled PT to increase their independence and safety with mobility (while adhering to their precautions) to allow discharge to the venue listed below.     Follow Up Recommendations SNF    Equipment Recommendations  None recommended by PT(TBD)    Recommendations for Other Services       Precautions / Restrictions Precautions Precautions: Back Precaution Booklet Issued: No Precaution Comments: educated pt however no adherence Restrictions Weight Bearing Restrictions: No      Mobility  Bed Mobility Overal bed mobility: Needs Assistance Bed Mobility: Rolling;Sidelying to Sit;Sit to Sidelying Rolling: Max assist;+2 for physical assistance Sidelying to sit: Total assist;+2 for physical assistance     Sit to sidelying: Total assist;+2 for physical assistance General bed mobility comments: with max verbal cues pt wiht slight initiation of LEs toward EOB but unable to use UEs to assist  Transfers                 General transfer comment: unable to tolerate  Ambulation/Gait             General Gait Details: unable to amb at baseline  Stairs            Wheelchair Mobility     Modified Rankin (Stroke Patients Only)       Balance Overall balance assessment: Needs assistance Sitting-balance support: Feet supported;Bilateral upper extremity supported Sitting balance-Leahy Scale: Poor Sitting balance - Comments: pt with varying support from min -modA. pt with limited functional use of hands to support self, did progress to using trunk to bring self forward when pt beginning to posterior lean Postural control: Posterior lean                                   Pertinent Vitals/Pain Pain Assessment: Faces Faces Pain Scale: Hurts even more Pain Location: back Pain Descriptors / Indicators: Grimacing Pain Intervention(s): Limited activity within patient's tolerance    Home Living Family/patient expects to be discharged to:: Private residence Living Arrangements: Spouse/significant other Available Help at Discharge: Available 24 hours/day Type of Home: House Home Access: Stairs to enter Entrance Stairs-Rails: None Entrance Stairs-Number of Steps: 2 Home Layout: One level Home Equipment: Tub bench;Grab bars - tub/shower;Walker - 2 wheels      Prior Function Level of Independence: Needs assistance   Gait / Transfers Assistance Needed: non-ambulatory due to pain; wife states he requires assistance to pull to stand and pivot into chair. Is able to walk a few steps ito toilet with min A.  ADL's / Homemaking Assistance Needed: family assists with bathing and dressing. wife states he washes his face and does his "front part"  Hand Dominance   Dominant Hand: Right    Extremity/Trunk Assessment   Upper Extremity Assessment Upper Extremity Assessment: Generalized weakness(unable to reach face with hands)    Lower Extremity Assessment Lower Extremity Assessment: Generalized weakness(grossly 3-/5, minimal active movement)    Cervical / Trunk Assessment Cervical / Trunk Assessment: Kyphotic;Other exceptions(back surgery)   Communication   Communication: HOH  Cognition Arousal/Alertness: Lethargic Behavior During Therapy: Flat affect Overall Cognitive Status: No family/caregiver present to determine baseline cognitive functioning                                 General Comments: slow to resopnd; inconsistently following 1 step commands      General Comments General comments (skin integrity, edema, etc.): bloody dressing, RN changed it    Exercises     Assessment/Plan    PT Assessment Patient needs continued PT services  PT Problem List Decreased strength;Decreased range of motion;Decreased activity tolerance;Decreased mobility;Decreased balance;Decreased coordination;Decreased cognition;Decreased knowledge of use of DME;Decreased safety awareness;Decreased knowledge of precautions;Pain;Obesity       PT Treatment Interventions DME instruction;Gait training;Stair training;Functional mobility training;Therapeutic activities;Therapeutic exercise;Balance training;Neuromuscular re-education    PT Goals (Current goals can be found in the Care Plan section)  Acute Rehab PT Goals Patient Stated Goal: get me out of here PT Goal Formulation: With patient Time For Goal Achievement: 06/25/18 Potential to Achieve Goals: Fair    Frequency Min 4X/week   Barriers to discharge Inaccessible home environment wife unable to provide maxA    Co-evaluation PT/OT/SLP Co-Evaluation/Treatment: Yes Reason for Co-Treatment: Complexity of the patient's impairments (multi-system involvement) PT goals addressed during session: Mobility/safety with mobility         AM-PAC PT "6 Clicks" Mobility  Outcome Measure Help needed turning from your back to your side while in a flat bed without using bedrails?: Total Help needed moving from lying on your back to sitting on the side of a flat bed without using bedrails?: Total Help needed moving to and from a bed to a chair (including a wheelchair)?: Total Help  needed standing up from a chair using your arms (e.g., wheelchair or bedside chair)?: Total Help needed to walk in hospital room?: Total Help needed climbing 3-5 steps with a railing? : Total 6 Click Score: 6    End of Session   Activity Tolerance: Patient tolerated treatment well Patient left: in bed;with call bell/phone within reach;with nursing/sitter in room(in chair position) Nurse Communication: Mobility status PT Visit Diagnosis: Unsteadiness on feet (R26.81);Difficulty in walking, not elsewhere classified (R26.2);Pain Pain - Right/Left: Right Pain - part of body: Leg(and back at incision)    Time: [id number] PT Time Calculation (min) (ACUTE ONLY): 40 min   Charges:   PT Evaluation $PT Eval Moderate Complexity: 1 Mod PT Treatments $Therapeutic Activity: 8-22 mins        Lewis ShockAshly Shirley Decamp, PT, DPT Acute Rehabilitation Services Pager #: [phone] Office #: [phone]   Travis Hansenshly M Demika Galvan 06/11/2018, 10:45 AM

## 2018-06-11 NOTE — Progress Notes (Signed)
Patient extubated this morning.  He complains of pain.  Difficult to assess as to whether his preoperative lower extremity pain is better at present.  Seems to be tolerating extubation reasonably well at present.  Patient is awake.  He is aware.  He is following commands bilaterally.  His strength in both lower extremities appears stable.  His dressing is dry.  Chest remains very congested with copious secretions.  Abdomen soft.  Status post decompressive surgery.  Begin efforts at mobilization and therapy.

## 2018-06-11 NOTE — Progress Notes (Signed)
RT placed pt on servo BIPAP NIV for the night. Pt tolerating well. RT will continue to monitor.

## 2018-06-11 NOTE — Procedures (Signed)
Extubation Procedure Note  Patient Details:   Name: [name] DOB: 02-23-1941 MRN: [id number]   Airway Documentation:    Vent end date: 06/11/18 Vent end time: 0928   Evaluation  O2 sats: stable throughout Complications: No apparent complications Patient did tolerate procedure well. Bilateral Breath Sounds: Rhonchi   Yes  [name], [name] 06/11/2018, 9:31 AM

## 2018-06-11 NOTE — Progress Notes (Addendum)
NAME:  [name], MRN:  [id number], DOB:  05/05/1941, LOS: 4 ADMISSION DATE:  06/07/2018, CONSULTATION DATE:  06/07/2018 REFERRING MD:  Jordan LikesPool, CHIEF COMPLAINT: Respiratory failure  Brief History   77 yo male with severe spinal stenosis had T11-T12 decompressive laminectomy 12/20.  Post op developed respiratory failure from pain medications in setting of OSA and required intubation.  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   Micro Data:  Sputum 12/24 >>   Antimicrobials:    Interim history/subjective:  Remains sedated, on vent.  Increased respiratory secretions.  Had Fever overnight.  Objective   Blood pressure 134/63, pulse 85, temperature (!) 100.4 F (38 C), temperature source Axillary, resp. rate 18, weight (!) 154.1 kg, SpO2 96 %.    Vent Mode: PRVC FiO2 (%):  [30 %-40 %] 40 % Set Rate:  [16 bmp] 16 bmp Vt Set:  [472 mL-540 mL] 540 mL PEEP:  [5 cmH20] 5 cmH20 Pressure Support:  [12 cmH20] 12 cmH20 Plateau Pressure:  [17 cmH20-21 cmH20] 17 cmH20   Intake/Output Summary (Last 24 hours) at 06/11/2018 0732 Last data filed at 06/11/2018 0700 Gross per 24 hour  Intake 719.46 ml  Output 3645 ml  Net -2925.54 ml   Filed Weights   06/07/18 2000 06/08/18 0000  Weight: (!) 154.1 kg (!) 154.1 kg    Examination:  General - sedated Eyes - pupils reactive ENT - ETT in place Cardiac - regular rate/rhythm, no murmur Chest - b/l rhonchi Abdomen - soft, non tender, + bowel sounds Extremities - no cyanosis, clubbing,  or edema Skin - no rashes Neuro - open's eyes with stimulation   Resolved Hospital Problem list   Hyperkalemia from acidosis  Assessment & Plan:   Acute on chronic hypoxic/hypercapnic respiratory failure in setting of severe OSA and opiate pain medications. Plan - try to push vent weaning to extubation - oxygen to keep SpO2 90 to 95% - will need Bipap after extubation  Fever. Plan - f/u sputum culture - monitor fever curve - hold ABx for now  Hx of CAD, A fib, Diastolic CHF, Thoracic aortic aneurysm, HLD. Plan [facility] continue lasix, ASA, lipitor, cozaar - continue lasix - continue ASA, lipitor, cozaar  Spinal stenosis s/p T11-T12 decompressive laminectomy. Plan - post op care per neurosurgery  Acute metabolic encephalopathy. Hx of depression. Plan - RASS goal 0   - limit sedation while trying to wean vent - continue cymbalta, remeron  Anemia of chronic disease. Plan - f/u CBC intermittently  Hyperglycemia. Plan - SSI   Best practice:  Diet: NPO, tube feeds if unable to extubate soon DVT prophylaxis: SCD GI prophylaxis: Pepcid Mobility: bed rest Code Status: full code Family Communication: no family at bedside Disposition: ICU  Labs    CMP Latest Ref Rng & Units 06/11/2018 06/10/2018 06/09/2018  Glucose 70 - 99 mg/dL 409(W149(H) 119(J149(H) 478(G159(H)  BUN 8 - 23 mg/dL 95(A32(H) 21(H29(H) 08(M24(H)  Creatinine 0.61 - 1.24 mg/dL 5.780.92 4.690.98 6.290.83  Sodium 135 - 145 mmol/L 140 139 138  Potassium 3.5 - 5.1 mmol/L 3.8  4.1 4.3  Chloride 98 - 111 mmol/L 97(L) 99 97(L)  CO2 22 - 32 mmol/L 31 33(H) 30  Calcium 8.9 - 10.3 mg/dL 1.6(X8.6(L) 0.9(U8.6(L) 8.9  Total Protein 6.5 - 8.1 g/dL - - -  Total Bilirubin 0.3 - 1.2 mg/dL - - -  Alkaline Phos 38 - 126 U/L - - -  AST 15 - 41 U/L - - -  ALT 0 - 44 U/L - - -   CBC Latest Ref Rng & Units 06/11/2018 06/10/2018 06/09/2018  WBC 4.0 - 10.5 K/uL 9.5 7.7 8.6  Hemoglobin 13.0 - 17.0 g/dL 11.1(L) 10.8(L) 11.8(L)    Hematocrit 39.0 - 52.0 % 35.9(L) 34.9(L) 37.8(L)  Platelets 150 - 400 K/uL 137(L) 138(L) 156   CBG (last 3)  Recent Labs    06/10/18 1559 06/10/18 2034 06/10/18 2325  GLUCAP 136* 146* 127*   ABG    Component Value Date/Time   PHART 7.379 06/09/2018 0420   PCO2ART 58.1 (H) 06/09/2018 0420   PO2ART 65.9 (L) 06/09/2018 0420   HCO3 33.5 (H) 06/09/2018 0420   TCO2 36 (H) 06/08/2018 1432   ACIDBASEDEF 0.2 08/27/2015 1016   O2SAT 92.5 06/09/2018 0420    CC time 31 minutes  Coralyn HellingVineet Aayana Reinertsen, MD [facility] 06/11/2018, 7:32 AM

## 2018-06-12 LAB — GLUCOSE, CAPILLARY
Glucose-Capillary: 145 mg/dL — ABNORMAL HIGH (ref 70–99)
Glucose-Capillary: 158 mg/dL — ABNORMAL HIGH (ref 70–99)
Glucose-Capillary: 151 mg/dL — ABNORMAL HIGH (ref 70–99)
Glucose-Capillary: 138 mg/dL — ABNORMAL HIGH (ref 70–99)

## 2018-06-12 MED ORDER — LOSARTAN POTASSIUM 50 MG PO TABS
50.0000 mg | ORAL_TABLET | Freq: Every day | ORAL | Status: DC
  Administered 2018-06-12 – 2018-06-14 (×3): 50 mg via ORAL
  Filled 2018-06-12 (×3): qty 1

## 2018-06-12 MED ORDER — ATORVASTATIN CALCIUM 10 MG PO TABS
20.0000 mg | ORAL_TABLET | Freq: Every day | ORAL | Status: DC
  Administered 2018-06-12 – 2018-06-27 (×16): 20 mg via ORAL
  Filled 2018-06-12 (×16): qty 2

## 2018-06-12 MED ORDER — CYCLOBENZAPRINE HCL 10 MG PO TABS
10.0000 mg | ORAL_TABLET | Freq: Three times a day (TID) | ORAL | Status: DC | PRN
  Administered 2018-06-12 – 2018-06-28 (×25): 10 mg via ORAL
  Filled 2018-06-12 (×26): qty 1

## 2018-06-12 MED ORDER — LACTATED RINGERS IV SOLN: INTRAVENOUS | Status: DC | PRN

## 2018-06-12 MED ORDER — BISACODYL 10 MG RE SUPP
10.0000 mg | Freq: Every day | RECTAL | Status: DC | PRN
  Administered 2018-06-12: 10 mg via RECTAL
  Filled 2018-06-12: qty 1

## 2018-06-12 MED ORDER — CELECOXIB 200 MG PO CAPS
200.0000 mg | ORAL_CAPSULE | Freq: Two times a day (BID) | ORAL | Status: DC
  Administered 2018-06-12 – 2018-06-28 (×33): 200 mg via ORAL
  Filled 2018-06-12 (×34): qty 1

## 2018-06-12 MED ORDER — ACETAMINOPHEN 500 MG PO TABS
1000.0000 mg | ORAL_TABLET | Freq: Three times a day (TID) | ORAL | Status: DC | PRN
  Administered 2018-06-14 – 2018-06-18 (×4): 1000 mg via ORAL
  Filled 2018-06-12 (×4): qty 2

## 2018-06-12 MED ORDER — MIRTAZAPINE 15 MG PO TABS
15.0000 mg | ORAL_TABLET | Freq: Every day | ORAL | Status: DC
  Administered 2018-06-12 – 2018-06-27 (×16): 15 mg via ORAL
  Filled 2018-06-12 (×16): qty 1

## 2018-06-12 MED ORDER — ASPIRIN 81 MG PO CHEW
81.0000 mg | CHEWABLE_TABLET | Freq: Every day | ORAL | Status: DC
  Administered 2018-06-12 – 2018-06-14 (×3): 81 mg via ORAL
  Filled 2018-06-12 (×3): qty 1

## 2018-06-12 NOTE — Evaluation (Signed)
Clinical/Bedside Swallow Evaluation Patient Details  Name: [name] MRN: [id number] Date of Birth: Nov 24, 1940  Today's Date: 06/12/2018 Time: SLP Start Time (ACUTE ONLY): 0951 SLP Stop Time (ACUTE ONLY): 1005 SLP Time Calculation (min) (ACUTE ONLY): 14 min  Past Medical History:  Past Medical History:  Diagnosis Date  . Aortic aneurysm (HCC) 02/25/2018  . Arthritis    back,joints,neck-limited mobility,flexion-  . Constipation due to pain medication   . Depression   . Dysrhythmia 08/31/2015   AF new onset    /NSR noted 09/23/15  . Headache(784.0)   . Hepatitis   . Hepatitis B    "years ago"  . Hypertension   . Lumbar neuralgia   . Sleep apnea 1990's  . Urinary frequency    Past Surgical History:  Past Surgical History:  Procedure Laterality Date  . APPENDECTOMY    . BACK SURGERY     7 lumbar surgeries  . CYSTOSCOPY WITH RETROGRADE PYELOGRAM, URETEROSCOPY AND STENT PLACEMENT Left 08/27/2015   Procedure: CYSTOSCOPY WITH BILATERAL RETROGRADE PYELOGRAM, URETEROSCOPY AND BILATERAL STENT PLACEMENT;  Surgeon: Sebastian Acheheodore Manny, MD;  Location: [facility];  Service: Urology;  Laterality: Left;  . CYSTOSCOPY/URETEROSCOPY/HOLMIUM LASER/STENT PLACEMENT Left 10/18/2015   Procedure: CYSTOSCOPY/URETEROSCOPY/HOLMIUM LASER/STENT PLACEMENT;  Surgeon: Ihor GullyMark Ottelin, MD;  [facility] [place];  Service: Urology;  Laterality: Left;  . JOINT REPLACEMENT Right 1990's   Rt shoulder  . JOINT REPLACEMENT Right 2013   knee  . LUMBAR LAMINECTOMY/DECOMPRESSION MICRODISCECTOMY N/A 06/07/2018   Procedure: Laminectomy - T11-T12;  Surgeon: Julio SicksPool, Henry, MD;  [facility];  Service: Neurosurgery;  Laterality: N/A;   HPI:  Pt is a 77 yo male with severe spinal stenosis s/p T11-T12 decompressive laminectomy 12/20.  He was extubated post-op, but then developed respiratory failure from pain medications in setting of OSA and required re-intubation 12/20-12/24. PMH: OSA, A fib, Thoracic aortic  aneurysm, Diastolic CHF, Arthritis, depression, Hep B   Assessment / Plan / Recommendation Clinical Impression  Pt has intermittent coughing with thin liquids that is not observed with ice chips and purees. Given his significant dysphonia and weak, guarded cough, risk for impaired sensation resulting in silent aspiration increases. SLP also assisted with feeding as pt seemed quite impulsive. Recommend allowing ice chips sparingly with RN after oral care. Essential meds could be crushed in puree. Otherwise, would remain NPO pending improved vocal quality.  SLP Visit Diagnosis: Dysphagia, unspecified (R13.10)    Aspiration Risk  Moderate aspiration risk    Diet Recommendation NPO except meds;Ice chips PRN after oral care   Medication Administration: Crushed with puree    Other  Recommendations Oral Care Recommendations: Oral care QID   Follow up Recommendations (tba)      Frequency and Duration min 2x/week  2 weeks       Prognosis Prognosis for Safe Diet Advancement: Good      Swallow Study   General HPI: Pt is a 77 yo male with severe spinal stenosis s/p T11-T12 decompressive laminectomy 12/20.  He was extubated post-op, but then developed respiratory failure from pain medications in setting of OSA and required re-intubation 12/20-12/24. PMH: OSA, A fib, Thoracic aortic aneurysm, Diastolic CHF, Arthritis, depression, Hep B Type of Study: Bedside Swallow Evaluation Previous Swallow Assessment: none in chart Diet Prior to this Study: NPO Temperature Spikes Noted: Yes(100.4) Respiratory Status: Nasal cannula History of Recent Intubation: Yes Length of Intubations (days): 5 days Date extubated: 06/11/18 Behavior/Cognition: Alert;Cooperative;Requires cueing Oral Cavity Assessment: Within Functional Limits Oral Care Completed by  SLP: Recent completion by staff Oral Cavity - Dentition: Poor condition;Missing dentition Patient Positioning: Postural control adequate for  testing Baseline Vocal Quality: Breathy;Hoarse Volitional Cough: Weak;Other (Comment)(guarded) Volitional Swallow: Able to elicit    Oral/Motor/Sensory Function Overall Oral Motor/Sensory Function: Within functional limits   Ice Chips Ice chips: Within functional limits Presentation: Spoon   Thin Liquid Thin Liquid: Impaired Presentation: Spoon Pharyngeal  Phase Impairments: Multiple swallows;Cough - Immediate    Nectar Thick Nectar Thick Liquid: Not tested   Honey Thick Honey Thick Liquid: Not tested   Puree Puree: Within functional limits Presentation: Spoon   Solid     Solid: Not tested      Maxcine Hamaiewonsky, Valory Wetherby 06/12/2018,10:13 AM   Maxcine HamLaura Paiewonsky, M.A. CCC-SLP Acute Herbalistehabilitation Services Pager [phone] Office [phone]

## 2018-06-12 NOTE — Progress Notes (Signed)
RT placed pt on CPAP dream station for the night. CPAP is set to auto titrate 20 high 5 low with 4 lpm bled into the system. Pt tolerating well. RT will continue to monitor.

## 2018-06-12 NOTE — Progress Notes (Signed)
NAME:  [name], MRN:  [id number], DOB:  09-Jul-1940, LOS: 5 ADMISSION DATE:  06/07/2018, CONSULTATION DATE:  06/07/2018 REFERRING MD:  Jordan LikesPool, CHIEF COMPLAINT: Respiratory failure  Brief History   77 yo male with severe spinal stenosis had T11-T12 decompressive laminectomy 12/20.  Post op developed respiratory failure from pain medications in setting of OSA and required intubation.  Past Medical History  OSA, A fib, Thoracic aortic aneurysm, Diastolic CHF, Arthritis, depression, Hep B  Significant Hospital Events   12/20 admit, to OR for t11-t12 decompressive laminectomy 12/20 postoperative respiratory failure requiring intubation, A fib with RVR 12/21 hyperkalemia 12/24 Fever 101.2  Consults:    Procedures:  ETT 12/20 >> 12/24  Significant Diagnostic Tests:  MRI thoracic spine 9/17 > advanced T11-T12 degenerative disease with cord compression and foraminal impingement.   Micro Data:  Sputum 12/24 >>   Antimicrobials:    Interim history/subjective:  Denies chest pain, dyspnea.  Not having as much chest congestion.  Objective   Blood pressure (!) 156/67, pulse 75, temperature 100 F (37.8 C), temperature source Axillary, resp. rate (!) 25, height 5\' 8"  (1.727 m), weight (!) 151 kg, SpO2 100 %.    Vent Mode: BIPAP FiO2 (%):  [40 %] 40 % PEEP:  [5 cmH20] 5 cmH20 Pressure Support:  [10 cmH20] 10 cmH20   Intake/Output Summary (Last 24 hours) at 06/12/2018 0859 Last data filed at 06/12/2018 0800 Gross per 24 hour  Intake 243.89 ml  Output 2700 ml  Net -2456.11 ml   Filed Weights   06/07/18 2000 06/08/18 0000 06/11/18 [id number]  Weight: (!) 154.1 kg (!) 154.1 kg (!) 151 kg    Examination:  General - alert Eyes - pupils reactive ENT - no sinus tenderness, no stridor, raspy voice Cardiac - regular rate/rhythm, no murmur Chest - equal breath sounds b/l, no wheezing or rales Abdomen - soft, non tender, + bowel sounds Extremities - no cyanosis, clubbing, or  edema Skin - no rashes Neuro - normal strength, moves extremities, follows commands Psych - normal mood and behavior   Resolved Hospital Problem list   Hyperkalemia from acidosis, acute metabolic encephalopathy  Assessment & Plan:   Acute on chronic hypoxic/hypercapnic respiratory failure in setting of severe OSA and opiate pain medications. Plan - oxygen prn to keep SpO2 90 to 95% - CPAP qhs - bronchial hygiene - mobilize as able  Fever. Plan - f/u sputum culture - hold ABx for now  Hx of CAD, A fib, Diastolic CHF, Thoracic aortic aneurysm, HLD. Plan - [facility] - continue lasix, lipitor, cozaar, ASA  Spinal stenosis s/p T11-T12 decompressive laminectomy. Plan - post op care, pain control per neurosurgery  Hx of depression. Plan - continue cymbalta, remeron  Dysphagia after intubation. Plan - f/u with speech therapy  Anemia of chronic disease. Plan - f/u CBC intermittently  Hyperglycemia. Plan - SSI   Best practice:  Diet: NPO DVT prophylaxis: lovenox GI prophylaxis: not indicated Mobility: mobilize as able Code Status: full code Family Communication: no family at bedside Disposition: okay to transfer out of ICU >> defer to neurosurgery  Recommending consulting hospitalist if medical assistance still needed once he transfers out of ICU.  Labs    CMP Latest Ref Rng & Units 06/11/2018 06/10/2018 06/09/2018  Glucose 70 - 99 mg/dL 621(H149(H) 086(V149(H) 784(O159(H)  BUN 8 - 23 mg/dL 96(E32(H) 95(M29(H) 84(X24(H)  Creatinine 0.61 - 1.24 mg/dL 3.240.92 4.010.98 0.270.83  Sodium 135 - 145 mmol/L 140 139 138  Potassium 3.5 - 5.1 mmol/L 3.8 4.1 4.3  Chloride 98 - 111 mmol/L 97(L) 99 97(L)  CO2 22 - 32 mmol/L 31 33(H) 30  Calcium 8.9 - 10.3 mg/dL 1.6(X8.6(L) 0.9(U8.6(L) 8.9  Total Protein 6.5 - 8.1 g/dL - - -  Total Bilirubin 0.3 - 1.2 mg/dL - - -  Alkaline Phos 38 - 126 U/L - - -  AST 15 - 41 U/L - - -  ALT 0 - 44 U/L - - -   CBC Latest Ref Rng & Units 06/11/2018  06/10/2018 06/09/2018  WBC 4.0 - 10.5 K/uL 9.5 7.7 8.6  Hemoglobin 13.0 - 17.0 g/dL 11.1(L) 10.8(L) 11.8(L)  Hematocrit 39.0 - 52.0 % 35.9(L) 34.9(L) 37.8(L)  Platelets 150 - 400 K/uL 137(L) 138(L) 156   CBG (last 3)  Recent Labs    06/11/18 1945 06/12/18 0315 06/12/18 0752  GLUCAP 171* 145* 158*   ABG    Component Value Date/Time   PHART 7.379 06/09/2018 0420   PCO2ART 58.1 (H) 06/09/2018 0420   PO2ART 65.9 (L) 06/09/2018 0420   HCO3 33.5 (H) 06/09/2018 0420   TCO2 36 (H) 06/08/2018 1432   ACIDBASEDEF 0.2 08/27/2015 1016   O2SAT 92.5 06/09/2018 0420     Coralyn HellingVineet Jinny Sweetland, MD [place] [facility] 06/12/2018, 8:59 AM

## 2018-06-12 NOTE — Plan of Care (Signed)
Pt c/o significant pain w/ any small movement or activity.

## 2018-06-12 NOTE — Progress Notes (Signed)
Patient ID: [name] L Narez, male   DOB: [birth year], 77 y.o.   MRN: [id number] Patient is extubated though he complains that he cannot swallow and would like some water Mobilization has been minimal with patient's abdomen moderately distended is not been passing gas no bowel movement since surgery Required substantial supportive care and I would not advise transferring the patient out of the unit at the current time

## 2018-06-13 ENCOUNTER — Inpatient Hospital Stay (HOSPITAL_COMMUNITY): Payer: Medicare HMO

## 2018-06-13 DIAGNOSIS — R1311 Dysphagia, oral phase: Secondary | ICD-10-CM

## 2018-06-13 DIAGNOSIS — E876 Hypokalemia: Secondary | ICD-10-CM

## 2018-06-13 DIAGNOSIS — G4733 Obstructive sleep apnea (adult) (pediatric): Secondary | ICD-10-CM

## 2018-06-13 LAB — BASIC METABOLIC PANEL
Anion gap: 14 (ref 5–15)
BUN: 37 mg/dL — ABNORMAL HIGH (ref 8–23)
CO2: 34 mmol/L — ABNORMAL HIGH (ref 22–32)
Calcium: 9.6 mg/dL (ref 8.9–10.3)
Chloride: 98 mmol/L (ref 98–111)
Creatinine, Ser: 0.89 mg/dL (ref 0.61–1.24)
GFR calc Af Amer: >60 mL/min (ref >60)
GFR calc non Af Amer: >60 mL/min (ref >60)
Glucose, Bld: 163 mg/dL — ABNORMAL HIGH (ref 70–99)
Potassium: 3 mmol/L — ABNORMAL LOW (ref 3.5–5.1)
SODIUM: 146 mmol/L — AB (ref 135–145)

## 2018-06-13 LAB — GLUCOSE, CAPILLARY
GLUCOSE-CAPILLARY: 150 mg/dL — AB (ref 70–99)
GLUCOSE-CAPILLARY: 180 mg/dL — AB (ref 70–99)
Glucose-Capillary: 141 mg/dL — ABNORMAL HIGH (ref 70–99)
Glucose-Capillary: 151 mg/dL — ABNORMAL HIGH (ref 70–99)
Glucose-Capillary: 155 mg/dL — ABNORMAL HIGH (ref 70–99)
Glucose-Capillary: 159 mg/dL — ABNORMAL HIGH (ref 70–99)
Glucose-Capillary: 161 mg/dL — ABNORMAL HIGH (ref 70–99)
Glucose-Capillary: 163 mg/dL — ABNORMAL HIGH (ref 70–99)
Glucose-Capillary: 165 mg/dL — ABNORMAL HIGH (ref 70–99)
Glucose-Capillary: 169 mg/dL — ABNORMAL HIGH (ref 70–99)

## 2018-06-13 LAB — CBC
HCT: 42 % (ref 39.0–52.0)
Hemoglobin: 12.6 g/dL — ABNORMAL LOW (ref 13.0–17.0)
MCH: 28.7 pg (ref 26.0–34.0)
MCHC: 30 g/dL (ref 30.0–36.0)
MCV: 95.7 fL (ref 80.0–100.0)
Platelets: 231 10*3/uL (ref 150–400)
RBC: 4.39 MIL/uL (ref 4.22–5.81)
RDW: 14.1 % (ref 11.5–15.5)
WBC: 10 10*3/uL (ref 4.0–10.5)
nRBC: 0 % (ref 0.0–0.2)

## 2018-06-13 LAB — CULTURE, RESPIRATORY W GRAM STAIN

## 2018-06-13 LAB — CULTURE, RESPIRATORY

## 2018-06-13 MED ORDER — RESOURCE THICKENUP CLEAR PO POWD
ORAL | Status: DC | PRN
  Administered 2018-06-14: 17:00:00 via ORAL
  Filled 2018-06-13 (×6): qty 125

## 2018-06-13 MED ORDER — METOPROLOL TARTRATE 5 MG/5ML IV SOLN
5.0000 mg | Freq: Once | INTRAVENOUS | Status: AC
  Administered 2018-06-13: 5 mg via INTRAVENOUS
  Filled 2018-06-13: qty 5

## 2018-06-13 MED ORDER — POTASSIUM CHLORIDE 10 MEQ/100ML IV SOLN
10.0000 meq | INTRAVENOUS | Status: AC
  Administered 2018-06-13 (×6): 10 meq via INTRAVENOUS
  Filled 2018-06-13 (×6): qty 100

## 2018-06-13 MED ORDER — LABETALOL HCL 5 MG/ML IV SOLN: 10.0000 mg | INTRAVENOUS | Status: DC | PRN

## 2018-06-13 NOTE — Progress Notes (Signed)
Modified Barium Swallow Progress Note  Patient Details  Name: [name] MRN: [id number] Date of Birth: May 14, 1941  Today's Date: 06/13/2018  Modified Barium Swallow completed.  Full report located under Chart Review in the Imaging Section.  Brief recommendations include the following:  Clinical Impression  Pt demosntrates a severe dysphagia with mild oral deficits including fatigue during mastication of solids, and slight delay in swallow initiation leading to consistent sensed and silent aspiration of all liquids before the swallow. 1/2 teaspoon quantity of honey is tolerated with only trace penetration, any larger boluses are aspirated. There is also mildly reduced epiglottic deflection due to bulge of posterior pharyngeal wall and mild base of tongue weakness. Pt may consume finely chopped solids or pudding thick liquids. He is quite impulsive and though he could also drink honey thick liquids via teaspoon with total assist, he would be expected to drink any drinks within reach; safety awareness is poor. Question if recent intubation has worsened sensation for swallow trigger or if pt may have been struggling with timing of swallow prior to admission. Will f/u for evidence of improvement. Pt cannot follow positional strategies due to poor neck ROM so therapeutic interventions may be limited.    Swallow Evaluation Recommendations       SLP Diet Recommendations: Dysphagia 2 (Fine chop) solids;No liquids;Pudding thick liquid       Medication Administration: Whole meds with puree       Compensations: Slow rate;Small sips/bites   Postural Changes: Seated upright at 90 degrees   Oral Care Recommendations: Oral care BID        [name], [name] 06/13/2018,2:18 PM

## 2018-06-13 NOTE — Progress Notes (Signed)
Inpatient Rehabilitation-Admissions Coordinator   Please see note placed by Cruzita Ledereraniel Anguilli, PA with CIR for details. AC has communicated with CM and SW regarding current recommendations. AC will sign off. Please re-consult if pt becomes appropriate.   Nanine MeansKelly Jarris Kortz, OTR/L  Rehab Admissions Coordinator  [phone] 06/13/2018 11:39 AM

## 2018-06-13 NOTE — Progress Notes (Signed)
NAME:  [name] L Lares, MRN:  [id number], DOB:  Jun 22, 1940, LOS: 6 ADMISSION DATE:  06/07/2018, CONSULTATION DATE:  06/07/2018 REFERRING MD:  Jordan LikesPool, CHIEF COMPLAINT: Respiratory failure  Brief History   77 yo male with severe spinal stenosis had T11-T12 decompressive laminectomy 12/20.  Post op developed respiratory failure from pain medications in setting of OSA and required intubation.  Past Medical History  OSA, A fib, Thoracic aortic aneurysm, Diastolic CHF, Arthritis, depression, Hep B  Significant Hospital Events   12/20 admit, to OR for t11-t12 decompressive laminectomy 12/20 postoperative respiratory failure requiring intubation, A fib with RVR 12/21 hyperkalemia 12/24 Fever 101.2  Consults:    Procedures:  ETT 12/20 >> 12/24  Significant Diagnostic Tests:  MRI thoracic spine 9/17 > advanced T11-T12 degenerative disease with cord compression and foraminal impingement.   Micro Data:  Sputum 12/24 >> Pseudomonas  Antimicrobials:    Interim history/subjective:  Wants something to eat and wants to get out of bed.  Objective   Blood pressure (!) 154/127, pulse 92, temperature 98.6 F (37 C), temperature source Axillary, resp. rate (!) 22, height 5\' 8"  (1.727 m), weight (!) 151 kg, SpO2 92 %.        Intake/Output Summary (Last 24 hours) at 06/13/2018 0837 Last data filed at 06/13/2018 0800 Gross per 24 hour  Intake 175.81 ml  Output 2225 ml  Net -2049.19 ml   Filed Weights   06/07/18 2000 06/08/18 0000 06/11/18 [id number]  Weight: (!) 154.1 kg (!) 154.1 kg (!) 151 kg    Examination:  General - alert Eyes - pupils reactive ENT - no sinus tenderness, no stridor, garbled speech but more clear today Cardiac - regular rate/rhythm, no murmur Chest - scattered rhonchi Abdomen - soft, non tender, + bowel sounds Extremities - no cyanosis, clubbing, or edema Skin - no rashes Neuro - normal strength, moves extremities, follows commands Psych - normal mood and  behavior   Resolved Hospital Problem list   Hyperkalemia from acidosis, acute metabolic encephalopathy, acute hypoxic/hypercapnic respiratory failure from sedation/opiate medications  Assessment & Plan:   Severe OSA. Plan - goal SpO2 90 to 95% - auto CPAP qhs  Pseudomonas in sputum culture from 12/24. Plan - no longer having fever, respiratory exam relatively benign, and WBC in normal range - will not start ABx at this time  Hypokalemia. Plan - f/u BMET, Mg  Hx of CAD, A fib, Diastolic CHF, Thoracic aortic aneurysm, HLD. Plan - [facility] - continue lasix, lipitor, cozaar, ASA  Spinal stenosis s/p T11-T12 decompressive laminectomy. Plan - PT/OT - pain control per neurosurgery  Hx of depression. Plan - continue cymbalta, remeron  Dysphagia after intubation. Plan - f/u with speech therapy  Anemia of chronic disease. Plan - f/u CBC intermittently  Hyperglycemia. Plan - SSI   Best practice:  Diet: meds with puree, otherwise NPO DVT prophylaxis: lovenox GI prophylaxis: not indicated Mobility: mobilize as able Code Status: full code Family Communication: no family at bedside Disposition: transfer out of ICU when ok with neurosurgery  Labs    CMP Latest Ref Rng & Units 06/13/2018 06/11/2018 06/10/2018  Glucose 70 - 99 mg/dL 119(J163(H) 478(G149(H) 956(O149(H)  BUN 8 - 23 mg/dL 13(Y37(H) 86(V32(H) 78(I29(H)  Creatinine 0.61 - 1.24 mg/dL 6.960.89 2.950.92 2.840.98  Sodium 135 - 145 mmol/L 146(H) 140 139  Potassium 3.5 - 5.1 mmol/L 3.0(L) 3.8 4.1  Chloride 98 - 111 mmol/L 98 97(L) 99  CO2 22 - 32 mmol/L 34(H) 31 33(H)  Calcium 8.9 - 10.3 mg/dL 9.6 1.6(X8.6(L) 0.9(U8.6(L)  Total Protein 6.5 - 8.1 g/dL - - -  Total Bilirubin 0.3 - 1.2 mg/dL - - -  Alkaline Phos 38 - 126 U/L - - -  AST 15 - 41 U/L - - -  ALT 0 - 44 U/L - - -   CBC Latest Ref Rng & Units 06/13/2018 06/11/2018 06/10/2018  WBC 4.0 - 10.5 K/uL 10.0 9.5 7.7  Hemoglobin 13.0 - 17.0 g/dL 12.6(L) 11.1(L) 10.8(L)   Hematocrit 39.0 - 52.0 % 42.0 35.9(L) 34.9(L)  Platelets 150 - 400 K/uL 231 137(L) 138(L)   CBG (last 3)  Recent Labs    06/13/18 0010 06/13/18 0404 06/13/18 0807  GLUCAP 163* 169* 150*   ABG    Component Value Date/Time   PHART 7.379 06/09/2018 0420   PCO2ART 58.1 (H) 06/09/2018 0420   PO2ART 65.9 (L) 06/09/2018 0420   HCO3 33.5 (H) 06/09/2018 0420   TCO2 36 (H) 06/08/2018 1432   ACIDBASEDEF 0.2 08/27/2015 1016   O2SAT 92.5 06/09/2018 0420     Coralyn HellingVineet Ja Ohman, MD [place] [facility] 06/13/2018, 8:37 AM

## 2018-06-13 NOTE — Progress Notes (Signed)
eLink Physician-Brief Progress Note Patient Name: Travis EavesBuford L Grinage DOB: 02-Mar-1941 MRN: [id number]   Date of Service  06/13/2018  HPI/Events of Note  Hypokalemia  eICU Interventions  Potassium replaced     Intervention Category Intermediate Interventions: Electrolyte abnormality - evaluation and management  DETERDING,ELIZABETH 06/13/2018, 6:28 AM

## 2018-06-13 NOTE — Progress Notes (Addendum)
RT place pt on CPAP dream station with 5 lpm bled into system. CPAP on auto titrate with 20 high 5 low.  Pt tolerating well. RT will continue to monitor.

## 2018-06-13 NOTE — Progress Notes (Signed)
  Speech Language Pathology Treatment: Dysphagia  Patient Details Name: Kayren EavesBuford L Hamil MRN: [id number] DOB: 09-11-1940 Today's Date: 06/13/2018 Time: 1140-1200 SLP Time Calculation (min) (ACUTE ONLY): 20 min  Assessment / Plan / Recommendation Clinical Impression  Pt continues to demonstrate immediate explosive coughing with thin liquids. Has tolerate puree and ice well. Will f/u with MBS today to determine best diet and etiology of dysphagia as wife repoets pt may have been aspirating prior to admission.   HPI HPI: Pt is a 77 yo male with severe spinal stenosis s/p T11-T12 decompressive laminectomy 12/20.  He was extubated post-op, but then developed respiratory failure from pain medications in setting of OSA and required re-intubation 12/20-12/24. PMH: OSA, A fib, Thoracic aortic aneurysm, Diastolic CHF, Arthritis, depression, Hep B      SLP Plan  MBS       Recommendations  Diet recommendations: NPO Medication Administration: Crushed with puree                Oral Care Recommendations: Oral care QID SLP Visit Diagnosis: Dysphagia, unspecified (R13.10) Plan: MBS       GO               Harlon DittyBonnie Lindzee Gouge, MA CCC-SLP  Acute Rehabilitation Services Pager [phone] Office [phone]  [name], Nishat Livingston Caroline 06/13/2018, 2:06 PM

## 2018-06-13 NOTE — Progress Notes (Addendum)
Physical Therapy Treatment Patient Details Name: [name] L [name] MRN: [id number] DOB: 01-17-1941 Today's Date: 06/13/2018    History of Present Illness 77 yo male with severe spinal stenosis had T11-T12 decompressive laminectomy 12/20.  Post op developed respiratory failure from pain medications in setting of OSA and required intubation. A fib, RVR; extubated 12/24.Patient status post operative fusion from L1-L5.  OMH: depression; Hepatits; Hepatitis B; HTN; lumbar neuralgia.    PT Comments    [facility].  Severely diminished safety and deficit awareness as well as continued LE weakness making OOB pivot to BSC unsafe despite +2 A.  Feel he will continue to benefit from acute skilled PT to progress mobility and safety as able.  Continue to recommend SNF level rehab upon d/c.   Follow Up Recommendations  SNF     Equipment Recommendations  None recommended by PT    Recommendations for Other Services       Precautions / Restrictions Precautions Precautions: Back Precaution Comments: educated pt however no adherence    Mobility  Bed Mobility Overal bed mobility: Needs Assistance Bed Mobility: Rolling;Sidelying to Sit;Sit to Sidelying Rolling: Mod assist;+2 for physical assistance Sidelying to sit: +2 for safety/equipment;Max assist;HOB elevated     Sit to sidelying: +2 for safety/equipment;Max assist General bed mobility comments: assist to roll, to bring legs off bed and to lift trunk, to side with assist for trunk and legs, painful to roll over, but scooted up with assist and elevated HOB  Transfers Overall transfer level: Needs assistance Equipment used: None Transfers: Squat Pivot Transfers     Squat pivot transfers: +2 physical assistance;Max assist     General transfer comment: attempted with +2 max A, but pt unable to safely move to [facility] despite multiple attempts due to legs sliding out from under him and LE weakness, unable  to take weight in LE's  Ambulation/Gait             General Gait Details: unable to amb at baseline   Stairs             Wheelchair Mobility    Modified Rankin (Stroke Patients Only)       Balance Overall balance assessment: Needs assistance Sitting-balance support: Feet supported Sitting balance-Leahy Scale: Poor Sitting balance - Comments: leaning forward to get to [name] mod A for safety at EOB                                    Cognition Arousal/Alertness: Awake/alert Behavior During Therapy: Anxious;Impulsive Overall Cognitive Status: Impaired/Different from baseline Area of Impairment: Following commands;Safety/judgement;Problem solving                       Following Commands: Follows one step commands inconsistently;Follows one step commands with increased time Safety/Judgement: Decreased awareness of deficits;Decreased awareness of safety   Problem Solving: Slow processing;Requires verbal cues;Requires tactile cues General Comments: focused on OOB to [name] but unsafe and LE's not taking his weight to get him there safely, needed manual assist for safety and return to supine      Exercises      General Comments General comments (skin integrity, edema, etc.): pt for swallow test, RN wished for PT to assist to [facility] as pt requesting, unsafe to complete attempt to returned to supine and transport arriving to take pt for MBS;  HR max 150's  and SpO2 into the 80's with attempt for OOB, HR 135, SpO2 92% at rest end of session      Pertinent Vitals/Pain Pain Assessment: Faces Faces Pain Scale: Hurts little more Pain Location: back Pain Descriptors / Indicators: Grimacing;Guarding;Moaning Pain Intervention(s): Monitored during session;Repositioned    Home Living                      Prior Function            PT Goals (current goals can now be found in the care plan section) Progress towards PT goals: Progressing toward  goals    Frequency    Min 4X/week      PT Plan Current plan remains appropriate    Co-evaluation              AM-PAC PT "6 Clicks" Mobility   Outcome Measure  Help needed turning from your back to your side while in a flat bed without using bedrails?: Total Help needed moving from lying on your back to sitting on the side of a flat bed without using bedrails?: Total Help needed moving to and from a bed to a chair (including a wheelchair)?: Total Help needed standing up from a chair using your arms (e.g., wheelchair or bedside chair)?: Total Help needed to walk in hospital room?: Total Help needed climbing 3-5 steps with a railing? : Total 6 Click Score: 6    End of Session   Activity Tolerance: Patient limited by fatigue Patient left: in bed;Other (comment)(with transporter )   PT Visit Diagnosis: Unsteadiness on feet (R26.81);Difficulty in walking, not elsewhere classified (R26.2)     Time: [id number] PT Time Calculation (min) (ACUTE ONLY): 23 min  Charges:  $Therapeutic Activity: 23-37 mins                     Sheran LawlessCyndi Antrice Pal, South CarolinaPT Acute Rehabilitation Services [phone] 06/13/2018    [name] 06/13/2018, 4:11 PM

## 2018-06-13 NOTE — Progress Notes (Signed)
Patient ID: [name], male   DOB: [birth year], 77 y.o.   MRN: [id number] Overall patient doing okay condition of back pain no leg pain  Awake alert strength appears to be 5 out of 5 wound appears to be clean dry and intact  Continue optimize cardiopulmonary status still looks like he is working every to further critical care with regard to how long he is in the inner versus transferring to progressive work with physical and occupational therapy.

## 2018-06-13 NOTE — Progress Notes (Signed)
Physical medicine rehabilitation consult recently requested chart reviewed. Patient extubated 06/11/2018. Therapy evaluations underway current recommendations are for skilled nursing facility. At this time patient could not tolerate intensity of inpatient rehabilitation services. Agree with recommendations for skilled nursing facility. Please reconsult if patient mobility/tolerance improves

## 2018-06-14 ENCOUNTER — Encounter (HOSPITAL_COMMUNITY): Payer: Self-pay | Admitting: Internal Medicine

## 2018-06-14 LAB — BASIC METABOLIC PANEL
Anion gap: 15 (ref 5–15)
Anion gap: 14 (ref 5–15)
BUN: 48 mg/dL — ABNORMAL HIGH (ref 8–23)
BUN: 54 mg/dL — ABNORMAL HIGH (ref 8–23)
CHLORIDE: 103 mmol/L (ref 98–111)
CO2: 32 mmol/L (ref 22–32)
CO2: 29 mmol/L (ref 22–32)
CREATININE: 1.74 mg/dL — AB (ref 0.61–1.24)
Calcium: 10 mg/dL (ref 8.9–10.3)
Calcium: 9.7 mg/dL (ref 8.9–10.3)
Chloride: 100 mmol/L (ref 98–111)
Creatinine, Ser: 1.14 mg/dL (ref 0.61–1.24)
GFR calc Af Amer: >60 mL/min (ref >60)
GFR calc Af Amer: 43 mL/min — ABNORMAL LOW (ref >60)
GFR calc non Af Amer: >60 mL/min (ref >60)
GFR calc non Af Amer: 37 mL/min — ABNORMAL LOW (ref >60)
Glucose, Bld: 170 mg/dL — ABNORMAL HIGH (ref 70–99)
Glucose, Bld: 182 mg/dL — ABNORMAL HIGH (ref 70–99)
Potassium: 3.1 mmol/L — ABNORMAL LOW (ref 3.5–5.1)
Potassium: 3.2 mmol/L — ABNORMAL LOW (ref 3.5–5.1)
Sodium: 147 mmol/L — ABNORMAL HIGH (ref 135–145)
Sodium: 146 mmol/L — ABNORMAL HIGH (ref 135–145)

## 2018-06-14 LAB — CBC
HCT: 46.3 % (ref 39.0–52.0)
HCT: 45.3 % (ref 39.0–52.0)
Hemoglobin: 14.2 g/dL (ref 13.0–17.0)
Hemoglobin: 14.3 g/dL (ref 13.0–17.0)
MCH: 29 pg (ref 26.0–34.0)
MCH: 29.7 pg (ref 26.0–34.0)
MCHC: 30.7 g/dL (ref 30.0–36.0)
MCHC: 31.6 g/dL (ref 30.0–36.0)
MCV: 94.5 fL (ref 80.0–100.0)
MCV: 94 fL (ref 80.0–100.0)
NRBC: 0 % (ref 0.0–0.2)
NRBC: 0.2 % (ref 0.0–0.2)
PLATELETS: 313 10*3/uL (ref 150–400)
Platelets: 274 10*3/uL (ref 150–400)
RBC: 4.9 MIL/uL (ref 4.22–5.81)
RBC: 4.82 MIL/uL (ref 4.22–5.81)
RDW: 14.1 % (ref 11.5–15.5)
RDW: 14.1 % (ref 11.5–15.5)
WBC: 12 10*3/uL — ABNORMAL HIGH (ref 4.0–10.5)
WBC: 12.6 10*3/uL — ABNORMAL HIGH (ref 4.0–10.5)

## 2018-06-14 LAB — MAGNESIUM
Magnesium: 2.3 mg/dL (ref 1.7–2.4)
Magnesium: 2.2 mg/dL (ref 1.7–2.4)

## 2018-06-14 LAB — GLUCOSE, CAPILLARY
GLUCOSE-CAPILLARY: 148 mg/dL — AB (ref 70–99)
Glucose-Capillary: 170 mg/dL — ABNORMAL HIGH (ref 70–99)
Glucose-Capillary: 172 mg/dL — ABNORMAL HIGH (ref 70–99)
Glucose-Capillary: 168 mg/dL — ABNORMAL HIGH (ref 70–99)
Glucose-Capillary: 165 mg/dL — ABNORMAL HIGH (ref 70–99)
Glucose-Capillary: 140 mg/dL — ABNORMAL HIGH (ref 70–99)

## 2018-06-14 MED ORDER — DILTIAZEM HCL 60 MG PO TABS
30.0000 mg | ORAL_TABLET | Freq: Three times a day (TID) | ORAL | Status: DC
  Administered 2018-06-14 – 2018-06-15 (×2): 30 mg via ORAL
  Filled 2018-06-14 (×3): qty 1

## 2018-06-14 MED ORDER — SODIUM CHLORIDE 0.9 % IV SOLN
INTRAVENOUS | Status: DC
  Administered 2018-06-14 – 2018-06-15 (×2): via INTRAVENOUS

## 2018-06-14 MED ORDER — POTASSIUM CHLORIDE 10 MEQ/100ML IV SOLN
10.0000 meq | INTRAVENOUS | Status: AC
  Administered 2018-06-14 (×6): 10 meq via INTRAVENOUS
  Filled 2018-06-14 (×6): qty 100

## 2018-06-14 MED ORDER — SODIUM CHLORIDE 0.9 % IV BOLUS
500.0000 mL | Freq: Once | INTRAVENOUS | Status: AC
  Administered 2018-06-14: 500 mL via INTRAVENOUS

## 2018-06-14 MED ORDER — OXYCODONE HCL 5 MG PO TABS
5.0000 mg | ORAL_TABLET | Freq: Four times a day (QID) | ORAL | Status: DC | PRN
  Administered 2018-06-14 – 2018-06-17 (×9): 5 mg via ORAL
  Filled 2018-06-14 (×9): qty 1

## 2018-06-14 MED ORDER — METOPROLOL TARTRATE 5 MG/5ML IV SOLN
2.5000 mg | INTRAVENOUS | Status: DC | PRN
  Administered 2018-06-14 (×2): 5 mg via INTRAVENOUS
  Filled 2018-06-14 (×2): qty 5

## 2018-06-14 MED ORDER — FENTANYL CITRATE (PF) 100 MCG/2ML IJ SOLN
25.0000 ug | INTRAMUSCULAR | Status: DC | PRN
  Administered 2018-06-15 – 2018-06-17 (×7): 50 ug via INTRAVENOUS
  Filled 2018-06-14 (×7): qty 2

## 2018-06-14 NOTE — Progress Notes (Signed)
   Providing Compassionate, Quality Care - Together   Subjective: Patient reports significant pain in his mid back. He is wanting water, but has significant dysphagia and is being followed by speech. Nursing reports no new respiratory issues; patient wears CPAP at night.  Objective: Vital signs in last 24 hours: Temp:  [98.2 F (36.8 C)-98.9 F (37.2 C)] 98.2 F (36.8 C) (12/27 1100) Pulse Rate:  [39-116] 112 (12/27 1100) Resp:  [17-30] 29 (12/27 1100) BP: (92-159)/(47-112) 145/54 (12/27 1100) SpO2:  [90 %-99 %] 99 % (12/27 1100)  Intake/Output from previous day: 12/26 0701 - 12/27 0700 In: 693.3 [I.V.:3; IV Piggyback:690.3] Out: 2350 [Urine:2350] Intake/Output this shift: Total I/O In: 451.6 [IV Piggyback:451.6] Out: 750 [Urine:750]  MAE, plantar/dorsiflexion strength 5/5, unable to maintain leg raise in bed; BUE 5/5, grips 5/5 PERRLA Incision clean, dry, and intact  Lab Results: Recent Labs    06/13/18 0331 06/14/18 0313  WBC 10.0 12.0*  HGB 12.6* 14.2  HCT 42.0 46.3  PLT 231 274   BMET Recent Labs    06/13/18 0331 06/14/18 0306  NA 146* 147*  K 3.0* 3.1*  CL 98 100  CO2 34* 32  GLUCOSE 163* 170*  BUN 37* 48*  CREATININE 0.89 1.14  CALCIUM 9.6 10.0     Assessment/Plan: [name] is 7 days s/p T11-T12 decompressive laminectomy. He failed extubation post-operatively and required reintubation. He was extubated 12/24. He is using CPAP at night. CCM has placed transfer orders to [facility] and signed off. I have added 5mg  Oxy IR q6 hours to see if his pain can be better controlled. Speech therapy is going to reassess his swallow as his MBS showed significant aspiration. He needs to continue working with therapies, who are recommending [name] be discharged to a SNF. Will consult [facility] for collaboration on [name]' medical management due to his multiple comorbidities.   LOS: 7 days   Val EagleMeghan Bergman, DNP, AGNP-C Nurse Practitioner  [facility] [address], [address], [place], [place] [address] P: [phone]    F: [phone]  06/14/2018, 1:23 PM

## 2018-06-14 NOTE — Progress Notes (Signed)
Physical Therapy Treatment Patient Details Name: [name] MRN: [id number] DOB: 02/13/1941 Today's Date: 06/14/2018    History of Present Illness 77 yo male with severe spinal stenosis had T11-T12 decompressive laminectomy 12/20.  Post op developed respiratory failure from pain medications in setting of OSA and required intubation. A fib, RVR; extubated 12/24.Patient status post operative fusion from L1-L5.  OMH: depression; Hepatits; Hepatitis B; HTN; lumbar neuralgia.    PT Comments    Patient progressing to standing position with lift equipment, but unsafe to pivot to chair or to stand long due to posterior lean, not well tolerated with feet on platform and incontinent of stool.  Will need continued skilled PT during acute stay and SNF recommendation remains appropriate.   Follow Up Recommendations  SNF     Equipment Recommendations  None recommended by PT    Recommendations for Other Services       Precautions / Restrictions Precautions Precautions: Back;Fall    Mobility  Bed Mobility Overal bed mobility: Needs Assistance Bed Mobility: Rolling;Supine to Sit;Sit to Supine Rolling: +2 for physical assistance;Max assist   Supine to sit: Mod assist;+2 for physical assistance;HOB elevated Sit to supine: +2 for physical assistance;Max assist   General bed mobility comments: difficulty tolerating laying flat. assist to roll for hygiene due to soiled with BM, to sit assist with HOB elevated to lift trunk and scoot to EOB  Transfers Overall transfer level: Needs assistance   Transfers: Sit to/from Stand Sit to Stand: +2 safety/equipment;Max assist         General transfer comment: stood x 3 with [place] DecSara Plus, but leaning posteriorly and unable to maintain standing long enough to assist with hygiene or to move OOB to chair  Ambulation/Gait                 Stairs             Wheelchair Mobility    Modified Rankin (Stroke Patients Only)        Balance Overall balance assessment: Needs assistance Sitting-balance support: Feet supported Sitting balance-Leahy Scale: Zero Sitting balance - Comments: mod to max A for balance as leaning backward at EOB Postural control: Posterior lean Standing balance support: Bilateral upper extremity supported Standing balance-Leahy Scale: Zero Standing balance comment: leaning back in [place]ara Plus with difficulty attaining upright                            Cognition Arousal/Alertness: Awake/alert Behavior During Therapy: Anxious;Impulsive Overall Cognitive Status: Impaired/Different from baseline Area of Impairment: Following commands;Safety/judgement;Problem solving                       Following Commands: Follows one step commands consistently;Follows one step commands with increased time Safety/Judgement: Decreased awareness of deficits;Decreased awareness of safety   Problem Solving: Slow processing;Requires tactile cues;Requires verbal cues        Exercises      General Comments General comments (skin integrity, edema, etc.): patient assisted for hygiene once supine due to incontinent of stool, difficulty getting turned enough to fully clean and pt with limited tolerance to flat bed so RN informed needed further cleaning after a break with HOB elevated.  HR max with mobility this session 158, SpO2 not measured as pt pulled off probe      Pertinent Vitals/Pain Faces Pain Scale: Hurts whole lot Pain Location: back Pain Descriptors / Indicators: Grimacing;Guarding;Moaning Pain Intervention(s): Monitored during session;RN  gave pain meds during session;Limited activity within patient's tolerance    Home Living                      Prior Function            PT Goals (current goals can now be found in the care plan section) Progress towards PT goals: Progressing toward goals    Frequency    Min 4X/week      PT Plan Current plan remains  appropriate    Co-evaluation              AM-PAC PT "6 Clicks" Mobility   Outcome Measure  Help needed turning from your back to your side while in a flat bed without using bedrails?: Total Help needed moving from lying on your back to sitting on the side of a flat bed without using bedrails?: Total Help needed moving to and from a bed to a chair (including a wheelchair)?: Total Help needed standing up from a chair using your arms (e.g., wheelchair or bedside chair)?: Total Help needed to walk in hospital room?: Total Help needed climbing 3-5 steps with a railing? : Total 6 Click Score: 6    End of Session Equipment Utilized During Treatment: Other (comment)(Sara Plus) Activity Tolerance: Patient limited by pain;Patient limited by fatigue Patient left: in bed;with call bell/phone within reach Nurse Communication: Mobility status;Need for lift equipment PT Visit Diagnosis: Unsteadiness on feet (R26.81);Difficulty in walking, not elsewhere classified (R26.2)     Time: 0454-09811202-1251 PT Time Calculation (min) (ACUTE ONLY): 49 min  Charges:  $Therapeutic Activity: 38-52 mins                     Sheran LawlessCyndi Lyzbeth Genrich, [place]PT Acute Rehabilitation Services [phone] 06/14/2018    Elray Mcgregorynthia Kadijah Shamoon 06/14/2018, 1:26 PM

## 2018-06-14 NOTE — Progress Notes (Signed)
  Speech Language Pathology Treatment: Dysphagia  Patient Details Name: [name] MRN: [id number] DOB: 1941/01/27 Today's Date: 06/14/2018 Time: 1440-1510 SLP Time Calculation (min) (ACUTE ONLY): 30 min  Assessment / Plan / Recommendation Clinical Impression  Pt demonstrates no functional changes today; he is still severely dysphonic, still demonstrates immediate cough with normal quantity of liquid. There was some confusion about whether he should be on a diet. Pt does have an order for ground solids and "pudding thick" liquids - which essentially means no liquid textures. He is able to swallow pudding, puree and soft solids, but aspirates thin, nectar and honey. So he can have a dys 2 tray as long as no drinks are present. In addition, pt is able to swallow 1/2 teaspoons of honey thin liquids if careful hand feeding is given. If he gets more than 1/2 teaspoon, it did consistently enter the trachea on MBS, so very strict precautions are needed. Pts family was able to participate in training with me and Son administered 2 oz of honey thick liquids with 1/2 teaspoon sips successfully with supervision. So in addition to solids, pt have have 1/2 teaspoons of honey thick liquids when given by his son or wife.   Pts dysphagia is likely partly a result of recent intubation; expect mild improvement as phonation improves. However, strongly suspect that a baseline dysphagia was present prior to admit given wife's report of coughing and choking while drinking and eating since November. Questioned them about any history of stroke as pts deficits do appear somewhat neurogenic in nature. They deny this, but do report that around this time there were a few days when pt was unable to get out of bed on the morning and reported feeling abnormal.     HPI HPI: Pt is a 77 yo male with severe spinal stenosis s/p T11-T12 decompressive laminectomy 12/20.  He was extubated post-op, but then developed respiratory failure  from pain medications in setting of OSA and required re-intubation 12/20-12/24. PMH: OSA, A fib, Thoracic aortic aneurysm, Diastolic CHF, Arthritis, depression, Hep B      SLP Plan  Continue with current plan of care       Recommendations  Diet recommendations: Dysphagia 2 (fine chop);Pudding-thick liquid(1/2 teaspoon of honey thick) Liquids provided via: (1/2 teaspoon of honey thick ok)                Oral Care Recommendations: Oral care QID Follow up Recommendations: Inpatient Rehab Plan: Continue with current plan of care       GO               Travis DittyBonnie Sema Stangler, MA CCC-SLP  Acute Rehabilitation Services Pager [phone] Office [phone]  [name], [name] [name] 06/14/2018, 3:12 PM

## 2018-06-14 NOTE — Consult Note (Signed)
Consultation   JOSHAWN CRISSMAN FIE:[id number] DOB: 11/24/1940 DOA: 06/07/2018  PCP: Leola Brazil, DO  Requesting physician: Dr. Jordan Likes  Reason for consultation: Medical comorbidity management, respiratory failure  HPI: Jakwon L Rehberg is a 77 y.o. male with medical history significant of history of aortic aneurysm, hyperlipidemia, depression, hypertension, BPH, heart failure who underwent T11-T12 decompressive laminectomy due to severe spinal stenosis with Dr. Jordan Likes on 12/20.  Operatively, he was extubated in PACU, but upon extubation, he went into atrial fibrillation with RVR heart rate in the 140s.  Also with apneic spell due to postop anesthesia and underlying OSA.  PCCM was consulted.  He was intubated and eventually extubated on 12/24.   He complains of lower back pain, wondering when he can go home. He states his breathing is better, no complaints of chest pain, cough, nausea, vomiting, diarrhea. Has some lower abdominal pain which he is unable to characterize or tell me when it began. No fevers overnight. Continues to be very weak overall.   Review of Systems: As per HPI otherwise 10 point review of systems negative.   Past Medical History:  Diagnosis Date  . Aortic aneurysm (HCC) 02/25/2018  . Arthritis    back,joints,neck-limited mobility,flexion-  . Constipation due to pain medication   . Depression   . Dysrhythmia 08/31/2015   AF new onset    /NSR noted 09/23/15  . Headache(784.0)   . Hepatitis   . Hepatitis B    "years ago"  . Hypertension   . Lumbar neuralgia   . Sleep apnea 1990's  . Urinary frequency     Past Surgical History:  Procedure Laterality Date  . APPENDECTOMY    . BACK SURGERY     7 lumbar surgeries  . CYSTOSCOPY WITH RETROGRADE PYELOGRAM, URETEROSCOPY AND STENT PLACEMENT Left 08/27/2015   Procedure: CYSTOSCOPY WITH BILATERAL RETROGRADE PYELOGRAM, URETEROSCOPY AND BILATERAL STENT PLACEMENT;  Surgeon: Sebastian Ache, MD;  Location: [facility];  Service:  Urology;  Laterality: Left;  . CYSTOSCOPY/URETEROSCOPY/HOLMIUM LASER/STENT PLACEMENT Left 10/18/2015   Procedure: CYSTOSCOPY/URETEROSCOPY/HOLMIUM LASER/STENT PLACEMENT;  Surgeon: Ihor Gully, MD;  Location: [facility];  Service: Urology;  Laterality: Left;  . JOINT REPLACEMENT Right 1990's   Rt shoulder  . JOINT REPLACEMENT Right 2013   knee  . LUMBAR LAMINECTOMY/DECOMPRESSION MICRODISCECTOMY N/A 06/07/2018   Procedure: Laminectomy - T11-T12;  Surgeon: Julio Sicks, MD;  Location: [facility];  Service: Neurosurgery;  Laterality: N/A;     reports that he has never smoked. His smokeless tobacco use includes snuff. He reports that he does not drink alcohol or use drugs.  Allergies  Allergen Reactions  . Ibuprofen Hives and Rash  . Gabapentin Other (See Comments)    "makes me loopy"    Family History  Problem Relation Age of Onset  . Cancer Mother     Prior to Admission medications   Medication Sig Start Date End Date Taking? Authorizing Provider  acetaminophen (TYLENOL) 650 MG CR tablet Take 1,300 mg by mouth every 8 (eight) hours as needed for pain.   Yes [provider]  aspirin EC 81 MG tablet Take 81 mg by mouth daily.   Yes [provider]  atorvastatin (LIPITOR) 20 MG tablet Take 20 mg by mouth daily.   Yes [provider]  celecoxib (CELEBREX) 200 MG capsule Take 200 mg by mouth 2 (two) times daily.    Yes [provider]  Cholecalciferol (VITAMIN D3) 125 MCG (5000 UT) CAPS Take 5,000 Units by mouth  daily.   Yes [provider]  DULoxetine (CYMBALTA) 30 MG capsule Take 30 mg by mouth daily.   Yes [provider]  DULoxetine (CYMBALTA) 60 MG capsule Take 60 mg by mouth daily.    Yes [provider]  HYDROcodone-acetaminophen (NORCO) 10-325 MG tablet Take 1-2 tablets by mouth every 4 (four) hours as needed for moderate pain. Maximum dose per 24 hours - 8 pills Patient taking differently: Take 1-2 tablets by  mouth See admin instructions. Take 2 tablets by mouth in the morning, take 1 tablet by mouth at noon and take 1 tablet by mouth in the evening 10/18/15  Yes Ihor Gully, MD  losartan (COZAAR) 50 MG tablet Take 50 mg by mouth daily.   Yes [provider]  mirtazapine (REMERON) 15 MG tablet Take 15 mg by mouth at bedtime.   Yes [provider]  Multiple Vitamins-Minerals (PRESERVISION AREDS 2 PO) Take 1 capsule by mouth 2 (two) times daily.   Yes [provider]  tamsulosin (FLOMAX) 0.4 MG CAPS capsule Take 0.4 mg by mouth daily.    Yes [provider]  tobramycin (TOBREX) 0.3 % ophthalmic solution Place 1 drop into the right eye See admin instructions. Place 1 drop in right eye 3 times daily the day before, the day of and the day after eye shots   Yes [provider]  furosemide (LASIX) 20 MG tablet Take 20 mg by mouth daily.    [provider]    Physical Exam: Vitals:   06/14/18 1200 06/14/18 1300 06/14/18 1400 06/14/18 1500  BP: (!) 163/139 121/65 (!) 136/109 (!) 103/57  Pulse: (!) 120 67 (!) 120 (!) 120  Resp: (!) 26 (!) 28 19 20   Temp:      TempSrc:      SpO2: 92% 96% 92% 95%  Weight:      Height:         Constitutional: NAD, calm, comfortable, chronically ill appearing male  Eyes: PERRL, lids and conjunctivae normal ENMT: Mucous membranes are moist. Posterior pharynx clear of any exudate or lesions. Voice is hoarse  Neck: normal, supple, no masses, no thyromegaly Respiratory: clear to auscultation bilaterally, no wheezing, no crackles. Normal respiratory effort. No accessory muscle use.  Cardiovascular: Irregular rhythm, rate 110s, no murmurs / rubs / gallops. No extremity edema. Abdomen: no tenderness, no masses palpated. No hepatosplenomegaly. Bowel sounds positive.  Musculoskeletal: no clubbing / cyanosis. No joint deformity upper and lower extremities. Good ROM, no contractures. Normal muscle tone.  Skin: no rashes,  lesions, ulcers. No induration Neurologic: CN 2-12 grossly intact. Strength 5/5 in all 4.  Psychiatric: Normal judgment and insight. Alert and oriented x 3. Normal mood.   Labs on Admission: I have personally reviewed following labs and imaging studies  CBC: Recent Labs  Lab 06/08/18 0335  06/09/18 0436 06/10/18 0301 06/11/18 0154 06/13/18 0331 06/14/18 0313  WBC 10.7*   < > 8.6 7.7 9.5 10.0 12.0*  NEUTROABS 9.6*  --  6.6 5.7  --   --   --   HGB 12.7*   < > 11.8* 10.8* 11.1* 12.6* 14.2  HCT 40.6   < > 37.8* 34.9* 35.9* 42.0 46.3  MCV 93.5   < > 96.2 95.6 95.0 95.7 94.5  PLT 154   < > 156 138* 137* 231 274   < > = values in this interval not displayed.   Basic Metabolic Panel: Recent Labs  Lab 06/08/18 0026  06/09/18 0436 06/10/18  0301 06/11/18 0154 06/13/18 0331 06/14/18 0306  NA 138   < > 138 139 140 146* 147*  K 5.8*   < > 4.3 4.1 3.8 3.0* 3.1*  CL 95*   < > 97* 99 97* 98 100  CO2 33*   < > 30 33* 31 34* 32  GLUCOSE 242*   < > 159* 149* 149* 163* 170*  BUN 21   < > 24* 29* 32* 37* 48*  CREATININE 0.95   < > 0.83 0.98 0.92 0.89 1.14  CALCIUM 8.8*   < > 8.9 8.6* 8.6* 9.6 10.0  MG 2.0  --  2.0 1.9 2.0  --  2.3  PHOS 2.8  --  3.4 3.6  --   --   --    < > = values in this interval not displayed.   GFR: Estimated Creatinine Clearance: 77.8 mL/min (by C-G formula based on SCr of 1.14 mg/dL). Liver Function Tests: Recent Labs  Lab 06/07/18 1920  AST 33  ALT 22  ALKPHOS 66  BILITOT 0.7  PROT 6.7  ALBUMIN 3.3*   No results for input(s): LIPASE, AMYLASE in the last 168 hours. No results for input(s): AMMONIA in the last 168 hours. Coagulation Profile: No results for input(s): INR, PROTIME in the last 168 hours. Cardiac Enzymes: Recent Labs  Lab 06/07/18 1920 06/08/18 0200 06/08/18 1357  TROPONINI <0.03 0.03* 0.04*   BNP (last 3 results) No results for input(s): PROBNP in the last 8760 hours. HbA1C: No results for input(s): HGBA1C in the last 72  hours. CBG: Recent Labs  Lab 06/13/18 1951 06/13/18 2316 06/14/18 0322 06/14/18 0814 06/14/18 1138  GLUCAP 180* 161* 170* 172* 148*   Lipid Profile: No results for input(s): CHOL, HDL, LDLCALC, TRIG, CHOLHDL, LDLDIRECT in the last 72 hours. Thyroid Function Tests: No results for input(s): TSH, T4TOTAL, FREET4, T3FREE, THYROIDAB in the last 72 hours. Anemia Panel: No results for input(s): VITAMINB12, FOLATE, FERRITIN, TIBC, IRON, RETICCTPCT in the last 72 hours. Urine analysis:    Component Value Date/Time   COLORURINE YELLOW 06/07/2018 2154   APPEARANCEUR CLEAR 06/07/2018 2154   LABSPEC 1.025 06/07/2018 2154   PHURINE 5.5 06/07/2018 2154   GLUCOSEU 100 (A) 06/07/2018 2154   HGBUR LARGE (A) 06/07/2018 2154   BILIRUBINUR NEGATIVE 06/07/2018 2154   KETONESUR NEGATIVE 06/07/2018 2154   PROTEINUR NEGATIVE 06/07/2018 2154   UROBILINOGEN 1.0 07/30/2010 0413   NITRITE NEGATIVE 06/07/2018 2154   LEUKOCYTESUR NEGATIVE 06/07/2018 2154   Sepsis Labs: !!!!!!!!!!!!!!!!!!!!!!!!!!!!!!!!!!!!!!!!!!!! @LABRCNTIP (procalcitonin:4,lacticidven:4) ) Recent Results (from the past 240 hour(s))  Culture, respiratory (non-expectorated)     Status: None   Collection Time: 06/10/18  9:09 PM  Result Value Ref Range Status   Specimen Description TRACHEAL ASPIRATE  Final   Special Requests NONE  Final   Gram Stain   Final    RARE WBC PRESENT, PREDOMINANTLY PMN RARE GRAM NEGATIVE RODS Performed at [facility], [address] [place], [place] [address]    Culture MODERATE PSEUDOMONAS AERUGINOSA  Final   Report Status 06/13/2018 FINAL  Final   Organism ID, Bacteria PSEUDOMONAS AERUGINOSA  Final      Susceptibility   Pseudomonas aeruginosa - MIC*    CEFTAZIDIME 4 SENSITIVE Sensitive     CIPROFLOXACIN <=0.25 SENSITIVE Sensitive     GENTAMICIN <=1 SENSITIVE Sensitive     IMIPENEM 2 SENSITIVE Sensitive     PIP/TAZO 8 SENSITIVE Sensitive     CEFEPIME 4 SENSITIVE Sensitive     *  MODERATE  PSEUDOMONAS AERUGINOSA     Radiological Exams on Admission: Dg Swallowing Func-speech Pathology  Result Date: 06/13/2018 Objective Swallowing Evaluation: Type of Study: MBS-Modified Barium Swallow Study  Patient Details Name: Kayren EavesBuford L Breaker MRN: [id number] Date of Birth: 24-Jul-1940 Today's Date: 06/13/2018 Time: SLP Start Time (ACUTE ONLY): 1225 -SLP Stop Time (ACUTE ONLY): 1245 SLP Time Calculation (min) (ACUTE ONLY): 20 min Past Medical History: Past Medical History: Diagnosis Date . Aortic aneurysm (HCC) 02/25/2018 . Arthritis   back,joints,neck-limited mobility,flexion- . Constipation due to pain medication  . Depression  . Dysrhythmia 08/31/2015  AF new onset    /NSR noted 09/23/15 . Headache(784.0)  . Hepatitis  . Hepatitis B   "years ago" . Hypertension  . Lumbar neuralgia  . Sleep apnea 1990's . Urinary frequency  Past Surgical History: Past Surgical History: Procedure Laterality Date . APPENDECTOMY   . BACK SURGERY    7 lumbar surgeries . CYSTOSCOPY WITH RETROGRADE PYELOGRAM, URETEROSCOPY AND STENT PLACEMENT Left 08/27/2015  Procedure: CYSTOSCOPY WITH BILATERAL RETROGRADE PYELOGRAM, URETEROSCOPY AND BILATERAL STENT PLACEMENT;  Surgeon: Sebastian Acheheodore Manny, MD;  Location: [facility];  Service: Urology;  Laterality: Left; . CYSTOSCOPY/URETEROSCOPY/HOLMIUM LASER/STENT PLACEMENT Left 10/18/2015  Procedure: CYSTOSCOPY/URETEROSCOPY/HOLMIUM LASER/STENT PLACEMENT;  Surgeon: Ihor GullyMark Ottelin, MD;  [facility];  Service: Urology;  Laterality: Left; . JOINT REPLACEMENT Right 1990's  Rt shoulder . JOINT REPLACEMENT Right 2013  knee . LUMBAR LAMINECTOMY/DECOMPRESSION MICRODISCECTOMY N/A 06/07/2018  Procedure: Laminectomy - T11-T12;  Surgeon: Julio SicksPool, Henry, MD;  [facility];  Service: Neurosurgery;  Laterality: N/A; HPI: Pt is a [age] yo male with severe spinal stenosis s/p T11-T12 decompressive laminectomy 12/20.  He was extubated post-op, but then developed respiratory failure from pain medications in setting  of OSA and required re-intubation 12/20-12/24. PMH: OSA, A fib, Thoracic aortic aneurysm, Diastolic CHF, Arthritis, depression, Hep B  Subjective: pt alert,  hoarse Assessment / Plan / Recommendation CHL IP CLINICAL IMPRESSIONS 06/13/2018 Clinical Impression Pt demosntrates a severe dysphagia with mild oral deficits including fatigue during mastication of solids, and slight delay in swallow initiation leading to consistent sensed and silent aspiration of all liquids before the swallow. 1/2 teaspoon quantity of honey is tolerated with only trace penetration, any larger boluses are aspirated. There is also mildly reduced epiglottic deflection due to bulge of posterior pharyngeal wall and mild base of tongue weakness. Pt may consume finely chopped solids or pudding thick liquids. He is quite impulsive and though he could also drink honey thick liquids via teaspoon with total assist, he would be expected to drink any drinks within reach; safety awareness is poor. Question if recent intubation has worsened sensation for swallow trigger or if pt may have been struggling with timing of swallow prior to admission. Will f/u for evidence of improvement. Pt cannot follow positional strategies due to poor neck ROM so therapeutic interventions may be limited.  SLP Visit Diagnosis Dysphagia, oropharyngeal phase (R13.12) Attention and concentration deficit following -- Frontal lobe and executive function deficit following -- Impact on safety and function Severe aspiration risk   CHL IP TREATMENT RECOMMENDATION 06/12/2018 Treatment Recommendations Therapy as outlined in treatment plan below   Prognosis 06/13/2018 Prognosis for Safe Diet Advancement Fair Barriers to Reach Goals Severity of deficits Barriers/Prognosis Comment -- CHL IP DIET RECOMMENDATION 06/13/2018 SLP Diet Recommendations Dysphagia 2 (Fine chop) solids;No liquids;Pudding thick liquid Liquid Administration via -- Medication Administration Whole meds with puree  Compensations Slow rate;Small sips/bites Postural Changes Seated upright at 90 degrees   CHL  IP OTHER RECOMMENDATIONS 06/13/2018 Recommended Consults -- Oral Care Recommendations Oral care BID Other Recommendations --   CHL IP FOLLOW UP RECOMMENDATIONS 06/13/2018 Follow up Recommendations Inpatient Rehab   CHL IP FREQUENCY AND DURATION 06/13/2018 Speech Therapy Frequency (ACUTE ONLY) min 2x/week Treatment Duration 2 weeks      CHL IP ORAL PHASE 06/13/2018 Oral Phase Impaired Oral - Pudding Teaspoon -- Oral - Pudding Cup -- Oral - Honey Teaspoon WFL Oral - Honey Cup WFL Oral - Nectar Teaspoon WFL Oral - Nectar Cup WFL Oral - Nectar Straw WFL Oral - Thin Teaspoon -- Oral - Thin Cup -- Oral - Thin Straw -- Oral - Puree WFL Oral - Mech Soft -- Oral - Regular Decreased bolus cohesion;Delayed oral transit Oral - Multi-Consistency -- Oral - Pill -- Oral Phase - Comment --  CHL IP PHARYNGEAL PHASE 06/13/2018 Pharyngeal Phase Impaired Pharyngeal- Pudding Teaspoon -- Pharyngeal -- Pharyngeal- Pudding Cup -- Pharyngeal -- Pharyngeal- Honey Teaspoon Delayed swallow initiation-pyriform sinuses;Delayed swallow initiation-vallecula Pharyngeal -- Pharyngeal- Honey Cup Delayed swallow initiation-pyriform sinuses;Delayed swallow initiation-vallecula;Penetration/Aspiration before swallow;Reduced tongue base retraction;Pharyngeal residue - valleculae Pharyngeal Material enters airway, passes BELOW cords without attempt by patient to eject out (silent aspiration);Material does not enter airway;Material enters airway, remains ABOVE vocal cords and not ejected out Pharyngeal- Nectar Teaspoon Delayed swallow initiation-pyriform sinuses;Delayed swallow initiation-vallecula;Penetration/Aspiration before swallow;Reduced tongue base retraction;Pharyngeal residue - valleculae;Significant aspiration (Amount) Pharyngeal Material enters airway, passes BELOW cords and not ejected out despite cough attempt by patient Pharyngeal- Nectar Cup Delayed  swallow initiation-pyriform sinuses;Delayed swallow initiation-vallecula;Penetration/Aspiration before swallow;Reduced tongue base retraction;Pharyngeal residue - valleculae Pharyngeal Material enters airway, passes BELOW cords then ejected out;Material enters airway, passes BELOW cords and not ejected out despite cough attempt by patient Pharyngeal- Nectar Straw Delayed swallow initiation-pyriform sinuses;Penetration/Aspiration before swallow Pharyngeal Material enters airway, passes BELOW cords and not ejected out despite cough attempt by patient Pharyngeal- Thin Teaspoon -- Pharyngeal -- Pharyngeal- Thin Cup -- Pharyngeal -- Pharyngeal- Thin Straw -- Pharyngeal -- Pharyngeal- Puree Delayed swallow initiation-vallecula;Reduced tongue base retraction;Pharyngeal residue - valleculae Pharyngeal -- Pharyngeal- Mechanical Soft -- Pharyngeal -- Pharyngeal- Regular Pharyngeal residue - valleculae;Reduced tongue base retraction Pharyngeal -- Pharyngeal- Multi-consistency -- Pharyngeal -- Pharyngeal- Pill -- Pharyngeal -- Pharyngeal Comment --  No flowsheet data found. Harlon Ditty, MA CCC-SLP Acute Rehabilitation Services Pager [phone] Office [phone] Claudine Mouton 06/13/2018, 2:21 PM               Assessment/Plan  Acute hypoxic, hypercapnic respiratory failure due to postop anesthesia, OSA -Has been extubated on 12/24 -Currently on room air  -CPAP nightly -Follows with Pulmonology at [facility]   Atrial fibrillation RVR -Has not been on anticoagulation as outpatient, takes baby aspirin  -Was on cardizem gtt, stopped. HR on exam today remains 100-110s. Start cardizem 30mg  q8h and titrate as needed -Aspirin resume post-op   Acute on chronic diastolic heart failure -Follows with cardiology at [facility] -CXR revealed persistent pulmonary vascular congestion with interstitial edema  -Lasix IV BID, strict I/Os, daily weight   Fever on 12/24 -Sputum culture with pseudomonas, but  antibiotics not started as patient without clinical respiratory infection  -Afebrile last 24 hours  Hypokalemia -Replaced this AM, trend   Dysphagia -SLP evaluation currently on dysphagia 2 diet  HLD -Continue lipitor   HLD -Continue cozaar   Depression -Continue cymbalta, remeron   Ascending aortic aneurysm -Followed by Dr. Emelda Fear, Cardiothoracic Surgery at [facility] -Stable   Thoracic spine stenosis status post T11-T12 decompressive laminectomy -Per  primary service   Thank you for the consultation. We will continue to follow along with you.   Noralee StainJennifer Jullian Clayson, DO [facility] [email or web address] 06/14/2018, 4:21 PM

## 2018-06-14 NOTE — Progress Notes (Signed)
eLink Physician-Brief Progress Note Patient Name: [name] DOB: July 26, 1940 MRN: [id number]   Date of Service  06/14/2018  HPI/Events of Note  Hypokalemia  eICU Interventions  Potassium replaced     Intervention Category Intermediate Interventions: Electrolyte abnormality - evaluation and management  DETERDING,ELIZABETH 06/14/2018, 4:05 AM

## 2018-06-14 NOTE — Progress Notes (Signed)
ANTICOAGULATION CONSULT NOTE  Pharmacy Consult for Lovenox Indication: VTE prophylaxis  Labs: Recent Labs    06/13/18 0331 06/14/18 0306 06/14/18 0313  HGB 12.6*  --  14.2  HCT 42.0  --  46.3  PLT 231  --  274  CREATININE 0.89 1.14  --     Assessment: 77 yo M with severe spinal stenosis had T11-T12 decompressive laminectomy 12/20.  Post op developed respiratory failure and required intubation.  Now extubated and on RA.  Pharmacy consulted to dose Lovenox for VTE prophylaxis. CBC stable, SCr with slight increase likely due to ongoing diuresis. No bleed documented. Also on SCDs for VTE prophylaxis.    Goal of Therapy:  VTE prevention Monitor platelets by anticoagulation protocol: Yes   Plan:  Continue Lovenox 75mg  (~0.5 mg/kg) [place] q24h for BMI>30 Monitor CBC at least q72h, weight changes, s/sx bleeding No further changes anticipated.  Rx will sign off.  Toys 'R' UsKimberly Ashlyn Cabler, Pharm.D., BCPS Clinical Pharmacist  **Pharmacist phone directory can now be found on [email or web address] ([email or web address]).  Listed under [facility].  06/14/2018 8:42 AM

## 2018-06-14 NOTE — Progress Notes (Addendum)
NAME:  [name], MRN:  [id number], DOB:  1940/07/14, LOS: 7 ADMISSION DATE:  06/07/2018, CONSULTATION DATE:  06/07/2018 REFERRING MD:  Jordan LikesPool, CHIEF COMPLAINT: Respiratory failure  Brief History   77 yo male with severe spinal stenosis had T11-T12 decompressive laminectomy 12/20.  Post op developed respiratory failure from pain medications in setting of OSA and required intubation.  Past Medical History  OSA, A fib, Thoracic aortic aneurysm, Diastolic CHF, Arthritis, depression, Hep B  Significant Hospital Events   12/20 admit, to OR for t11-t12 decompressive laminectomy 12/20 postoperative respiratory failure requiring intubation, A fib with RVR 12/21 hyperkalemia 12/24 Fever 101.2 12/27 Tx to progressive care  Consults:  PCCM   Procedures:  ETT 12/20 >> 12/24  Significant Diagnostic Tests:  MRI thoracic spine 9/17 > advanced T11-T12 degenerative disease with cord compression and foraminal impingement.   Micro Data:  Sputum 12/24 >> Pseudomonas  Antimicrobials:    Interim history/subjective:  RN reports pt's K replaced this am. No acute events overnight.  Afebrile.   Objective   Blood pressure 130/71, pulse (!) 114, temperature 98.3 F (36.8 C), temperature source Oral, resp. rate 20, height 5\' 8"  (1.727 m), weight (!) 151 kg, SpO2 94 %.        Intake/Output Summary (Last 24 hours) at 06/14/2018 [id number] Last data filed at 06/14/2018 0600 Gross per 24 hour  Intake 620.5 ml  Output 2350 ml  Net -1729.5 ml   Filed Weights   06/07/18 2000 06/08/18 0000 06/11/18 [id number]  Weight: (!) 154.1 kg (!) 154.1 kg (!) 151 kg    Examination: General: obese male lying in bed in NAD HEENT: MM pink/moist, hoarse voice Neuro: Awake, alert, mild confusion, MAE CV: s1s2 rrr, no m/r/g PULM: even/non-labored, lungs bilaterally clear, diminished bases  ZH:YQMVGI:soft, non-tender, bsx4 active  Extremities: warm/dry, trace to 1+ BLE edema  Skin: no rashes or lesions  Resolved Hospital  Problem list   Hyperkalemia from acidosis, acute metabolic encephalopathy, acute hypoxic/hypercapnic respiratory failure from sedation/opiate medications  Assessment & Plan:   Severe OSA. Plan - wean O2 for sats 90-95% - auto CPAP QHS   Pseudomonas in sputum culture from 12/24. -pt afebrile, WBC WNL,  Plan - abx held as no indication of acute infection  - f/u intermittent CXR  Hypokalemia. Plan - trend BMP, Mg  Hx of CAD, A fib, Diastolic CHF, Thoracic aortic aneurysm, HLD. Plan - continue lipitor, cozaar, ASA, lasix  - [facility]   Spinal stenosis s/p T11-T12 decompressive laminectomy. Plan - Continue PT/OT efforts > recommending SNF rehab - post operative pain control per NSGY  Hx of depression. Plan - continue remeron, cymbalta   Dysphagia after intubation. Plan - SLP  Anemia of chronic disease. Plan - trend CBC   Hyperglycemia. Plan - SSI   Best practice:  Diet: per SLP > D2 fine chop, no liquids, pudding thick liquid DVT prophylaxis: lovenox GI prophylaxis: not indicated Mobility: mobilize as able Code Status: full code Family Communication: no family at bedside Disposition: Tx to progressive care.  SNF recommended by PT.  Currently, does not meet criteria for CIR.   PCCM will sign off. Please call back if new needs arise.   Labs    CMP Latest Ref Rng & Units 06/14/2018 06/13/2018 06/11/2018  Glucose 70 - 99 mg/dL 784(O170(H) 962(X163(H) 528(U149(H)  BUN 8 - 23 mg/dL 13(K48(H) 44(W37(H) 10(U32(H)  Creatinine 0.61 - 1.24 mg/dL 7.251.14 3.660.89 4.400.92  Sodium 135 - 145 mmol/L 147(H) 146(H) 140  Potassium 3.5 - 5.1 mmol/L 3.1(L) 3.0(L) 3.8  Chloride 98 - 111 mmol/L 100 98 97(L)  CO2 22 - 32 mmol/L 32 34(H) 31  Calcium 8.9 - 10.3 mg/dL 21.310.0 9.6 0.8(M8.6(L)  Total Protein 6.5 - 8.1 g/dL - - -  Total Bilirubin 0.3 - 1.2 mg/dL - - -  Alkaline Phos 38 - 126 U/L - - -  AST 15 - 41 U/L - - -  ALT 0 - 44 U/L - - -   CBC Latest Ref Rng & Units 06/14/2018 06/13/2018  06/11/2018  WBC 4.0 - 10.5 K/uL 12.0(H) 10.0 9.5  Hemoglobin 13.0 - 17.0 g/dL 57.814.2 12.6(L) 11.1(L)  Hematocrit 39.0 - 52.0 % 46.3 42.0 35.9(L)  Platelets 150 - 400 K/uL 274 231 137(L)   CBG (last 3)  Recent Labs    06/13/18 2316 06/14/18 0322 06/14/18 0814  GLUCAP 161* 170* 172*   ABG    Component Value Date/Time   PHART 7.379 06/09/2018 0420   PCO2ART 58.1 (H) 06/09/2018 0420   PO2ART 65.9 (L) 06/09/2018 0420   HCO3 33.5 (H) 06/09/2018 0420   TCO2 36 (H) 06/08/2018 1432   ACIDBASEDEF 0.2 08/27/2015 1016   O2SAT 92.5 06/09/2018 0420      Canary BrimBrandi Deziree Mokry, NP-C [facility] Pgr: [phone] or if no answer [phone] 06/14/2018, 9:22 AM

## 2018-06-14 NOTE — Progress Notes (Signed)
Pt having frequent PVC's, going in and out of ventricular bigeminy. Notified MD. Labs and Lopressor prn ordered.  Will continue to monitor

## 2018-06-15 ENCOUNTER — Inpatient Hospital Stay (HOSPITAL_COMMUNITY): Payer: Medicare HMO

## 2018-06-15 DIAGNOSIS — I4891 Unspecified atrial fibrillation: Secondary | ICD-10-CM

## 2018-06-15 DIAGNOSIS — N400 Enlarged prostate without lower urinary tract symptoms: Secondary | ICD-10-CM

## 2018-06-15 LAB — CBC
HCT: 44.5 % (ref 39.0–52.0)
Hemoglobin: 13.6 g/dL (ref 13.0–17.0)
MCH: 29 pg (ref 26.0–34.0)
MCHC: 30.6 g/dL (ref 30.0–36.0)
MCV: 94.9 fL (ref 80.0–100.0)
Platelets: 257 10*3/uL (ref 150–400)
RBC: 4.69 MIL/uL (ref 4.22–5.81)
RDW: 14.3 % (ref 11.5–15.5)
WBC: 11.3 10*3/uL — ABNORMAL HIGH (ref 4.0–10.5)
nRBC: 0 % (ref 0.0–0.2)

## 2018-06-15 LAB — BASIC METABOLIC PANEL
Anion gap: 16 — ABNORMAL HIGH (ref 5–15)
Anion gap: 12 (ref 5–15)
BUN: 58 mg/dL — ABNORMAL HIGH (ref 8–23)
BUN: 47 mg/dL — ABNORMAL HIGH (ref 8–23)
CALCIUM: 9.4 mg/dL (ref 8.9–10.3)
CO2: 28 mmol/L (ref 22–32)
CO2: 28 mmol/L (ref 22–32)
Calcium: 9.6 mg/dL (ref 8.9–10.3)
Chloride: 103 mmol/L (ref 98–111)
Chloride: 110 mmol/L (ref 98–111)
Creatinine, Ser: 1.42 mg/dL — ABNORMAL HIGH (ref 0.61–1.24)
Creatinine, Ser: 1.16 mg/dL (ref 0.61–1.24)
GFR calc Af Amer: 55 mL/min — ABNORMAL LOW (ref >60)
GFR calc Af Amer: >60 mL/min (ref >60)
GFR calc non Af Amer: >60 mL/min (ref >60)
GFR, EST NON AFRICAN AMERICAN: 47 mL/min — AB (ref >60)
Glucose, Bld: 210 mg/dL — ABNORMAL HIGH (ref 70–99)
Glucose, Bld: 191 mg/dL — ABNORMAL HIGH (ref 70–99)
Potassium: 3 mmol/L — ABNORMAL LOW (ref 3.5–5.1)
Potassium: 3.6 mmol/L (ref 3.5–5.1)
Sodium: 147 mmol/L — ABNORMAL HIGH (ref 135–145)
Sodium: 150 mmol/L — ABNORMAL HIGH (ref 135–145)

## 2018-06-15 LAB — MAGNESIUM
Magnesium: 2.3 mg/dL (ref 1.7–2.4)
Magnesium: 2.2 mg/dL (ref 1.7–2.4)

## 2018-06-15 LAB — GLUCOSE, CAPILLARY
Glucose-Capillary: 189 mg/dL — ABNORMAL HIGH (ref 70–99)
Glucose-Capillary: 244 mg/dL — ABNORMAL HIGH (ref 70–99)

## 2018-06-15 MED ORDER — METOPROLOL TARTRATE 5 MG/5ML IV SOLN
5.0000 mg | Freq: Four times a day (QID) | INTRAVENOUS | Status: DC
  Administered 2018-06-15 – 2018-06-16 (×4): 5 mg via INTRAVENOUS
  Filled 2018-06-15 (×4): qty 5

## 2018-06-15 MED ORDER — ENOXAPARIN SODIUM 80 MG/0.8ML ~~LOC~~ SOLN
75.0000 mg | Freq: Once | SUBCUTANEOUS | Status: AC
  Administered 2018-06-15: 75 mg via SUBCUTANEOUS
  Filled 2018-06-15: qty 0.8

## 2018-06-15 MED ORDER — ENOXAPARIN SODIUM 80 MG/0.8ML ~~LOC~~ SOLN
0.5000 mg/kg | SUBCUTANEOUS | Status: DC
  Administered 2018-06-16 – 2018-06-21 (×6): 75 mg via SUBCUTANEOUS
  Filled 2018-06-15 (×6): qty 0.8

## 2018-06-15 MED ORDER — SODIUM CHLORIDE 0.9 % IV SOLN: INTRAVENOUS | Status: DC | PRN

## 2018-06-15 MED ORDER — ENOXAPARIN SODIUM 300 MG/3ML IJ SOLN: 1.0000 mg/kg | Freq: Two times a day (BID) | INTRAMUSCULAR | Status: DC

## 2018-06-15 MED ORDER — POTASSIUM CHLORIDE CRYS ER 20 MEQ PO TBCR
40.0000 meq | EXTENDED_RELEASE_TABLET | ORAL | Status: AC
  Administered 2018-06-15 (×2): 40 meq via ORAL
  Filled 2018-06-15 (×2): qty 2

## 2018-06-15 MED ORDER — DILTIAZEM HCL 60 MG PO TABS
30.0000 mg | ORAL_TABLET | Freq: Four times a day (QID) | ORAL | Status: DC
  Administered 2018-06-15: 30 mg via ORAL
  Filled 2018-06-15: qty 1

## 2018-06-15 NOTE — Clinical Social Work Placement (Signed)
   CLINICAL SOCIAL WORK PLACEMENT  NOTE  Date:  06/15/2018  Patient Details  Name: [name] L Bueche MRN: [id number] Date of Birth: [birth year]  Clinical Social Work is seeking post-discharge placement for this patient at the Skilled  Nursing Facility level of care (*CSW will initial, date and re-position this form in  chart as items are completed):      Patient/family provided with [facility] Clinical Social Work Department's list of facilities offering this level of care within the geographic area requested by the patient (or if unable, by the patient's family).      Patient/family informed of their freedom to choose among providers that offer the needed level of care, that participate in Medicare, Medicaid or managed care program needed by the patient, have an available bed and are willing to accept the patient.      Patient/family informed of [facility]'s ownership interest in [facility] and [facility], as well as of the fact that they are under no obligation to receive care at these facilities.  PASRR submitted to EDS on 06/15/18     PASRR number received on       Existing PASRR number confirmed on 06/15/18     FL2 transmitted to all facilities in geographic area requested by pt/family on 06/15/18     FL2 transmitted to all facilities within larger geographic area on       Patient informed that his/her managed care company has contracts with or will negotiate with certain facilities, including the following:            Patient/family informed of bed offers received.  Patient chooses bed at       Physician recommends and patient chooses bed at      Patient to be transferred to   on  .  Patient to be transferred to facility by       Patient family notified on   of transfer.  Name of family member notified:        PHYSICIAN Please sign FL2     Additional Comment:    _______________________________________________ Margarito LinerSarah C Sandara Tyree, LCSW 06/15/2018, 12:46  PM

## 2018-06-15 NOTE — Progress Notes (Addendum)
PROGRESS NOTE    [name]   LDJ:[id number]  DOB: 04/09/1941  DOA: 06/07/2018 PCP: Nicola Girt, DO   Brief Narrative:  [name] is a 77 y.o. male with medical history significant of history of thoracic aortic aneurysm, hyperlipidemia, depression, hypertension, BPH, heart failure who underwent T11-T12 decompressive laminectomy due to severe spinal stenosis with Dr. Annette Stable on 12/20. Extubated in PACU, but upon extubation, he went into atrial fibrillation with RVR heart rate in the 140s.  Also with apneic spell due to postop anesthesia and underlying OSA.  PCCM was consulted.  He was intubated and eventually extubated on 12/24. He was transferred out of ICU and to [facility] (to act as consultants) on 12/27. He became hypotensive and tachycardic on 12/27 afternoon and was given fluid boluses. Antiarrythmic agents and antihypertensives were held I have done an extensive review of the chart including outpatient records.   Subjective: No complaints of nausea, vomiting, constipation diarrhea, cough, dyspnea or dysuria. No other complaints. Is waiting for SLP eval to see if he can come off of thick liquids.   Assessment & Plan:   Principal Problem:   Acute respiratory failure with hypoxia and hypercapnia  - suspected to be in relation to OSA in setting of pain medications- required intubation as mentioned above - pseudomonas + in sputum but no infiltrates or signs of infection (he had one temp spike but it did not recurr) - holding off antibiotics - using CPAP at bedtime- he is in the processs of getting one at home  Active Problems:   Parosyxmal A-fib   - not on any rate controlling agent at baseline and only on aspirin for secondary stroke prophylaxis - follows with cardiology at [facility] - cannot put on full dose anticoagulation at this time as he is post op laminectomy - HR currently in 130- oral Cardizem started but not effective - will switch to IV Lopressor 5  mg every 6 hrs   Acute hypotensive episode on 03/00 - I am uncertain of the cause as I was not the attending and there is not much documentation- ? Over diuresis-  - RN having troubled getting an accurate BP with the cuff and I have ordered an A line for accurate BPs (as he will be on medications to control rate) - BP noted to be stable with SBP in 120-130s  Mild AKI, hypernatremia - ? Over diuresis- given fluids yesterday- follow   Hypokalemia - replacing- Mg normal   Right heart failure and pulm HTN - mentioned in recent outpt IM note  - ECHO in care everywhere 01/14/18>  Left ventricle cavity size is normal.  Normal left ventricular systolic function  Ejection fraction is visually estimated at 92-33%  Diastolic function appears indeterminate.  Mild concentric left ventricular hypertrophy  Mild Dilation of the aortic root (4.26 cm)  Moderate to severe dilation of the Ascending Aorta (4.97 cm)  The IVC is not well visualized.  - cont Losartan- on 20 mg Lasix daily at home for right heart failure per IM note- no signs of fluid overload at this time-    Morbid obesity     OSA (obstructive sleep apnea) Body mass index is 47.53 kg/m.  - CPAP ordered    Spinal stenosis, thoracic - per primary team- s/p T11-T12 decompressive laminectomy    BPH (benign prostatic hyperplasia) - Flomax- d/c foley if OK with Neurosurgery  Thoracic Aortic Aneurysm -- followed up with CT surgery on 05/29/18 for surveillance  DVT prophylaxis: Lovenex Code Status: Full code Family Communication:  Disposition Plan: per primary team -   Antimicrobials:  Anti-infectives (From admission, onward)   Start     Dose/Rate Route Frequency Ordered Stop   06/07/18 2300  ceFAZolin (ANCEF) IVPB 1 g/50 mL premix     1 g 100 mL/hr over 30 Minutes Intravenous Every 8 hours 06/07/18 1957 06/08/18 1900   06/07/18 1513  vancomycin (VANCOCIN) powder  Status:  Discontinued       As needed 06/07/18 1513  06/07/18 1733   06/07/18 1512  bacitracin 50,000 Units in sodium chloride 0.9 % 500 mL irrigation  Status:  Discontinued       As needed 06/07/18 1512 06/07/18 1733   06/07/18 1400  ceFAZolin (ANCEF) 3 g in dextrose 5 % 50 mL IVPB     3 g 100 mL/hr over 30 Minutes Intravenous To [facility] 06/06/18 1135 06/07/18 2030       Objective: Vitals:   06/15/18 0425 06/15/18 0500 06/15/18 0600 06/15/18 0837  BP:  (!) 143/92    Pulse:  (!) 59 (!) 119   Resp:  16 (!) 25   Temp:    98.3 F (36.8 C)  TempSrc:    Oral  SpO2:  96% 97%   Weight: (!) 141.8 kg     Height:        Intake/Output Summary (Last 24 hours) at 06/15/2018 0840 Last data filed at 06/15/2018 0700 Gross per 24 hour  Intake 1622.31 ml  Output 1890 ml  Net -267.69 ml   Filed Weights   06/08/18 0000 06/11/18 0822 06/15/18 0425  Weight: (!) 154.1 kg (!) 151 kg (!) 141.8 kg    Examination: General exam: Appears comfortable  HEENT: PERRLA, oral mucosa moist, no sclera icterus or thrush Respiratory system: Clear to auscultation. Respiratory effort normal. Cardiovascular system: S1 & S2 heard, IIRR.   Gastrointestinal system: Abdomen soft, non-tender, nondistended. Normal bowel sounds. Central nervous system: Alert and oriented. No focal neurological deficits. Extremities: No cyanosis, clubbing or edema Skin: No rashes or ulcers Psychiatry:  Mood & affect appropriate.     Data Reviewed: I have personally reviewed following labs and imaging studies  CBC: Recent Labs  Lab 06/09/18 0436 06/10/18 0301 06/11/18 0154 06/13/18 0331 06/14/18 0313 06/14/18 1829 06/15/18 0453  WBC 8.6 7.7 9.5 10.0 12.0* 12.6* 11.3*  NEUTROABS 6.6 5.7  --   --   --   --   --   HGB 11.8* 10.8* 11.1* 12.6* 14.2 14.3 13.6  HCT 37.8* 34.9* 35.9* 42.0 46.3 45.3 44.5  MCV 96.2 95.6 95.0 95.7 94.5 94.0 94.9  PLT 156 138* 137* 231 274 313 409   Basic Metabolic Panel: Recent Labs  Lab 06/09/18 0436 06/10/18 0301 06/11/18 0154  06/13/18 0331 06/14/18 0306 06/14/18 1829 06/15/18 0453  NA 138 139 140 146* 147* 146* 147*  K 4.3 4.1 3.8 3.0* 3.1* 3.2* 3.0*  CL 97* 99 97* 98 100 103 103  CO2 30 33* 31 34* 32 29 28  GLUCOSE 159* 149* 149* 163* 170* 182* 210*  BUN 24* 29* 32* 37* 48* 54* 58*  CREATININE 0.83 0.98 0.92 0.89 1.14 1.74* 1.42*  CALCIUM 8.9 8.6* 8.6* 9.6 10.0 9.7 9.4  MG 2.0 1.9 2.0  --  2.3 2.2 2.3  PHOS 3.4 3.6  --   --   --   --   --    GFR: Estimated Creatinine Clearance: 60.3 mL/min (A) (by C-G formula based  on SCr of 1.42 mg/dL (H)). Liver Function Tests: No results for input(s): AST, ALT, ALKPHOS, BILITOT, PROT, ALBUMIN in the last 168 hours. No results for input(s): LIPASE, AMYLASE in the last 168 hours. No results for input(s): AMMONIA in the last 168 hours. Coagulation Profile: No results for input(s): INR, PROTIME in the last 168 hours. Cardiac Enzymes: Recent Labs  Lab 06/08/18 1357  TROPONINI 0.04*   BNP (last 3 results) No results for input(s): PROBNP in the last 8760 hours. HbA1C: No results for input(s): HGBA1C in the last 72 hours. CBG: Recent Labs  Lab 06/14/18 1636 06/14/18 1950 06/14/18 2313 06/15/18 0430 06/15/18 0838  GLUCAP 168* 165* 140* 189* 244*   Lipid Profile: No results for input(s): CHOL, HDL, LDLCALC, TRIG, CHOLHDL, LDLDIRECT in the last 72 hours. Thyroid Function Tests: No results for input(s): TSH, T4TOTAL, FREET4, T3FREE, THYROIDAB in the last 72 hours. Anemia Panel: No results for input(s): VITAMINB12, FOLATE, FERRITIN, TIBC, IRON, RETICCTPCT in the last 72 hours. Urine analysis:    Component Value Date/Time   COLORURINE YELLOW 06/07/2018 2154   APPEARANCEUR CLEAR 06/07/2018 2154   LABSPEC 1.025 06/07/2018 2154   PHURINE 5.5 06/07/2018 2154   GLUCOSEU 100 (A) 06/07/2018 2154   HGBUR LARGE (A) 06/07/2018 2154   BILIRUBINUR NEGATIVE 06/07/2018 2154   KETONESUR NEGATIVE 06/07/2018 2154   PROTEINUR NEGATIVE 06/07/2018 2154   UROBILINOGEN 1.0  07/30/2010 0413   NITRITE NEGATIVE 06/07/2018 2154   LEUKOCYTESUR NEGATIVE 06/07/2018 2154   Sepsis Labs: _0 (procalcitonin:4,lacticidven:4) ) Recent Results (from the past 240 hour(s))  Culture, respiratory (non-expectorated)     Status: None   Collection Time: 06/10/18  9:09 PM  Result Value Ref Range Status   Specimen Description TRACHEAL ASPIRATE  Final   Special Requests NONE  Final   Gram Stain   Final    RARE WBC PRESENT, PREDOMINANTLY PMN RARE GRAM NEGATIVE RODS Performed at [facility], [place] [address], [place], [place] [address]    Culture MODERATE PSEUDOMONAS AERUGINOSA  Final   Report Status 06/13/2018 FINAL  Final   Organism ID, Bacteria PSEUDOMONAS AERUGINOSA  Final      Susceptibility   Pseudomonas aeruginosa - MIC*    CEFTAZIDIME 4 SENSITIVE Sensitive     CIPROFLOXACIN <=0.25 SENSITIVE Sensitive     GENTAMICIN <=1 SENSITIVE Sensitive     IMIPENEM 2 SENSITIVE Sensitive     PIP/TAZO 8 SENSITIVE Sensitive     CEFEPIME 4 SENSITIVE Sensitive     * MODERATE PSEUDOMONAS AERUGINOSA         Radiology Studies: Dg Swallowing Func-speech Pathology  Result Date: 06/13/2018 Objective Swallowing Evaluation: Type of Study: MBS-Modified Barium Swallow Study  Patient Details Name: [name] MRN: [id number] Date of Birth: 06-Sep-1940 Today's Date: 06/13/2018 Time: SLP Start Time (ACUTE ONLY): 1225 -SLP Stop Time (ACUTE ONLY): 1245 SLP Time Calculation (min) (ACUTE ONLY): 20 min Past Medical History: Past Medical History: Diagnosis Date . Aortic aneurysm ([place]) 02/25/2018 . Arthritis   back,joints,neck-limited mobility,flexion- . Constipation due to pain medication  . Depression  . Dysrhythmia 08/31/2015  AF new onset    /NSR noted 09/23/15 . Headache(784.0)  . Hepatitis  . Hepatitis B   "years ago" . Hypertension  . Lumbar neuralgia  . Sleep apnea 1990's . Urinary frequency  Past Surgical History: Past Surgical History: Procedure Laterality Date . APPENDECTOMY   . BACK  SURGERY    7 lumbar surgeries . CYSTOSCOPY WITH RETROGRADE PYELOGRAM, URETEROSCOPY AND STENT PLACEMENT Left 08/27/2015  Procedure: CYSTOSCOPY WITH BILATERAL RETROGRADE PYELOGRAM, URETEROSCOPY AND BILATERAL STENT PLACEMENT;  Surgeon: Alexis Frock, MD;  Location: [facility];  Service: Urology;  Laterality: Left; . CYSTOSCOPY/URETEROSCOPY/HOLMIUM LASER/STENT PLACEMENT Left 10/18/2015  Procedure: CYSTOSCOPY/URETEROSCOPY/HOLMIUM LASER/STENT PLACEMENT;  Surgeon: Kathie Rhodes, MD;  Location: [facility];  Service: Urology;  Laterality: Left; . JOINT REPLACEMENT Right 1990's  Rt shoulder . JOINT REPLACEMENT Right 2013  knee . LUMBAR LAMINECTOMY/DECOMPRESSION MICRODISCECTOMY N/A 06/07/2018  Procedure: Laminectomy - T11-T12;  Surgeon: Earnie Larsson, MD;  Location: [place];  Service: Neurosurgery;  Laterality: N/A; HPI: Pt is a 77 yo male with severe spinal stenosis s/p T11-T12 decompressive laminectomy 12/20.  He was extubated post-op, but then developed respiratory failure from pain medications in setting of OSA and required re-intubation 12/20-12/24. PMH: OSA, A fib, Thoracic aortic aneurysm, Diastolic CHF, Arthritis, depression, Hep B  Subjective: pt alert,  hoarse Assessment / Plan / Recommendation CHL IP CLINICAL IMPRESSIONS 06/13/2018 Clinical Impression Pt demosntrates a severe dysphagia with mild oral deficits including fatigue during mastication of solids, and slight delay in swallow initiation leading to consistent sensed and silent aspiration of all liquids before the swallow. 1/2 teaspoon quantity of honey is tolerated with only trace penetration, any larger boluses are aspirated. There is also mildly reduced epiglottic deflection due to bulge of posterior pharyngeal wall and mild base of tongue weakness. Pt may consume finely chopped solids or pudding thick liquids. He is quite impulsive and though he could also drink honey thick liquids via teaspoon with total assist, he would be expected to drink any  drinks within reach; safety awareness is poor. Question if recent intubation has worsened sensation for swallow trigger or if pt may have been struggling with timing of swallow prior to admission. Will f/u for evidence of improvement. Pt cannot follow positional strategies due to poor neck ROM so therapeutic interventions may be limited.  SLP Visit Diagnosis Dysphagia, oropharyngeal phase (R13.12) Attention and concentration deficit following -- Frontal lobe and executive function deficit following -- Impact on safety and function Severe aspiration risk   CHL IP TREATMENT RECOMMENDATION 06/12/2018 Treatment Recommendations Therapy as outlined in treatment plan below   Prognosis 06/13/2018 Prognosis for Safe Diet Advancement Fair Barriers to Reach Goals Severity of deficits Barriers/Prognosis Comment -- CHL IP DIET RECOMMENDATION 06/13/2018 SLP Diet Recommendations Dysphagia 2 (Fine chop) solids;No liquids;Pudding thick liquid Liquid Administration via -- Medication Administration Whole meds with puree Compensations Slow rate;Small sips/bites Postural Changes Seated upright at 90 degrees   CHL IP OTHER RECOMMENDATIONS 06/13/2018 Recommended Consults -- Oral Care Recommendations Oral care BID Other Recommendations --   CHL IP FOLLOW UP RECOMMENDATIONS 06/13/2018 Follow up Recommendations Inpatient Rehab   CHL IP FREQUENCY AND DURATION 06/13/2018 Speech Therapy Frequency (ACUTE ONLY) min 2x/week Treatment Duration 2 weeks      CHL IP ORAL PHASE 06/13/2018 Oral Phase Impaired Oral - Pudding Teaspoon -- Oral - Pudding Cup -- Oral - Honey Teaspoon WFL Oral - Honey Cup WFL Oral - Nectar Teaspoon WFL Oral - Nectar Cup WFL Oral - Nectar Straw WFL Oral - Thin Teaspoon -- Oral - Thin Cup -- Oral - Thin Straw -- Oral - Puree WFL Oral - Mech Soft -- Oral - Regular Decreased bolus cohesion;Delayed oral transit Oral - Multi-Consistency -- Oral - Pill -- Oral Phase - Comment --  CHL IP PHARYNGEAL PHASE 06/13/2018 Pharyngeal Phase  Impaired Pharyngeal- Pudding Teaspoon -- Pharyngeal -- Pharyngeal- Pudding Cup -- Pharyngeal -- Pharyngeal- Honey Teaspoon Delayed swallow initiation-pyriform sinuses;Delayed swallow initiation-vallecula  Pharyngeal -- Pharyngeal- Honey Cup Delayed swallow initiation-pyriform sinuses;Delayed swallow initiation-vallecula;Penetration/Aspiration before swallow;Reduced tongue base retraction;Pharyngeal residue - valleculae Pharyngeal Material enters airway, passes BELOW cords without attempt by patient to eject out (silent aspiration);Material does not enter airway;Material enters airway, remains ABOVE vocal cords and not ejected out Pharyngeal- Nectar Teaspoon Delayed swallow initiation-pyriform sinuses;Delayed swallow initiation-vallecula;Penetration/Aspiration before swallow;Reduced tongue base retraction;Pharyngeal residue - valleculae;Significant aspiration (Amount) Pharyngeal Material enters airway, passes BELOW cords and not ejected out despite cough attempt by patient Pharyngeal- Nectar Cup Delayed swallow initiation-pyriform sinuses;Delayed swallow initiation-vallecula;Penetration/Aspiration before swallow;Reduced tongue base retraction;Pharyngeal residue - valleculae Pharyngeal Material enters airway, passes BELOW cords then ejected out;Material enters airway, passes BELOW cords and not ejected out despite cough attempt by patient Pharyngeal- Nectar Straw Delayed swallow initiation-pyriform sinuses;Penetration/Aspiration before swallow Pharyngeal Material enters airway, passes BELOW cords and not ejected out despite cough attempt by patient Pharyngeal- Thin Teaspoon -- Pharyngeal -- Pharyngeal- Thin Cup -- Pharyngeal -- Pharyngeal- Thin Straw -- Pharyngeal -- Pharyngeal- Puree Delayed swallow initiation-vallecula;Reduced tongue base retraction;Pharyngeal residue - valleculae Pharyngeal -- Pharyngeal- Mechanical Soft -- Pharyngeal -- Pharyngeal- Regular Pharyngeal residue - valleculae;Reduced tongue base  retraction Pharyngeal -- Pharyngeal- Multi-consistency -- Pharyngeal -- Pharyngeal- Pill -- Pharyngeal -- Pharyngeal Comment --  No flowsheet data found. Herbie Baltimore, MA CCC-SLP Acute Rehabilitation Services Pager [phone] Office [phone] Lynann Beaver 06/13/2018, 2:21 PM                 Scheduled Meds: . aspirin  81 mg Oral Daily  . atorvastatin  20 mg Oral q1800  . celecoxib  200 mg Oral BID  . chlorhexidine gluconate (MEDLINE KIT)  15 mL Mouth Rinse BID  . cholecalciferol  5,000 Units Oral Daily  . diltiazem  30 mg Oral Q6H  . DULoxetine  90 mg Oral Daily  . enoxaparin (LOVENOX) injection  0.5 mg/kg Subcutaneous Q24H  . losartan  50 mg Oral Daily  . mouth rinse  15 mL Mouth Rinse q12n4p  . mirtazapine  15 mg Oral QHS  . multivitamin  1 tablet Oral Daily  . sodium chloride flush  3 mL Intravenous Q12H  . tamsulosin  0.4 mg Oral Daily  . tobramycin  1 drop Right Eye TID   Continuous Infusions: . sodium chloride    . lactated ringers       LOS: 8 days    Time spent in minutes: [place], MD [facility] Pager: [email or web address] Password [email or web address] 06/15/2018, 8:40 AM

## 2018-06-15 NOTE — Clinical Social Work Note (Signed)
Clinical Social Work Assessment  Patient Details  Name: [name] MRN: [id number] Date of Birth: 16-Feb-1941  Date of referral:  06/15/18               Reason for consult:  Facility Placement, Discharge Planning                Permission sought to share information with:  Facility Sport and exercise psychologist, Family Supports Permission granted to share information::  Yes, Verbal Permission Granted  Name::     Kizer Nobbe  Agency::  SNF's  Relationship::  Son  Contact Information:  [phone]  Housing/Transportation Living arrangements for the past 2 months:  Single Family Home Source of Information:  Patient, Medical Team, Adult Children Patient Interpreter Needed:  None Criminal Activity/Legal Involvement Pertinent to Current Situation/Hospitalization:  No - Comment as needed Significant Relationships:  Adult Children, Spouse Lives with:  Spouse Do you feel safe going back to the place where you live?  Yes Need for family participation in patient care:  Yes (Comment)  Care giving concerns:  PT recommending SNF once medically stable for discharge.   Social Worker assessment / plan:  CSW met with patient. Son at bedside. CSW introduced role and explained that PT recommendations would be discussed. Per patient's son, they want to see how he does in PT and that will determine if they want to pursue SNF or take patient home. RNCM aware. This is patient's ninth back surgery. PT has caused two back surgeries in the past and they want to avoid that again. If they do decide to pursue SNF, [facility] preference. No further concerns. CSW encouraged patient and his son to contact CSW as needed. CSW will continue to follow patient and his son for support and facilitate discharge to SNF, if agreeable, once medically stable.  Employment status:  Retired Nurse, adult PT Recommendations:  [facility] / Referral to community resources:   [place]  Patient/Family's Response to care:  Patient and his son want to see how he does in PT before deciding SNF vs. Home health. Patient's wife and son supportive and involved in patient's care. Patient and his son appreciated social work intervention.  Patient/Family's Understanding of and Emotional Response to Diagnosis, Current Treatment, and Prognosis:  Patient and his son have a good understanding of the reason for admission and social work consult. Patient and his son appear happy with hospital care.  Emotional Assessment Appearance:  Appears stated age Attitude/Demeanor/Rapport:  Engaged, Gracious Affect (typically observed):  Accepting, Appropriate, Calm, Pleasant Orientation:  Oriented to Self, Oriented to Place, Oriented to  Time, Oriented to Situation Alcohol / Substance use:  Never Used Psych involvement (Current and /or in the community):  No (Comment)  Discharge Needs  Concerns to be addressed:  Care Coordination Readmission within the last 30 days:  No Current discharge risk:  Dependent with Mobility Barriers to Discharge:  Continued Medical Work up, [place], LCSW 06/15/2018, 12:43 PM

## 2018-06-15 NOTE — Procedures (Signed)
Arterial Catheter Insertion Procedure Note Kayren EavesBuford L Scheiber [id number] 02-06-1941  Procedure: Insertion of Arterial Catheter  Indications: Blood pressure monitoring  Procedure Details Consent: Risks of procedure as well as the alternatives and risks of each were explained to the (patient/caregiver).  Consent for procedure obtained. Time Out: Verified patient identification, verified procedure, site/side was marked, verified correct patient position, special equipment/implants available, medications/allergies/relevent history reviewed, required imaging and test results available.  Performed  Maximum sterile technique was used including antiseptics, cap, gloves, gown, hand hygiene, mask and sheet. Skin prep: Chlorhexidine; local anesthetic administered 20 gauge catheter was inserted into right radial artery using the Seldinger technique. ULTRASOUND GUIDANCE USED: NO Evaluation Blood flow good; BP tracing good. Complications: No apparent complications.   Jennette KettleBrowning, Wiletta Bermingham Joy 06/15/2018

## 2018-06-15 NOTE — Progress Notes (Signed)
Patient's arterial line is leaking at the site. The dressing has been changed x2. Will continue to monitor. The patient's RN is aware.

## 2018-06-15 NOTE — NC FL2 (Signed)
[place] LEVEL OF CARE SCREENING TOOL     IDENTIFICATION  Patient Name: [name] Birthdate: February 15, 1941 Sex: male Admission Date (Current Location): 06/07/2018  [facility] and [place] Number:  [employer] and Address:  [facility], [place] [address] [place], [place] [address]      Provider Number: [id number]  Attending Physician Name and Address:  Earnie Larsson, MD  Relative Name and Phone Number:       Current Level of Care: Hospital Recommended Level of Care: [place] Prior Approval Number:    Date Approved/Denied:   PASRR Number: [id number]  Discharge Plan: SNF    Current Diagnoses: Patient Active Problem List   Diagnosis Date Noted  . A-fib ([place]) 06/15/2018  . BPH (benign prostatic hyperplasia) 06/15/2018  . Spinal stenosis, thoracic 06/07/2018  . Acute respiratory failure with hypoxia and hypercapnia ([facility])   . Left nephrolithiasis 08/27/2015  . Urolithiasis 08/27/2015  . Morbid obesity ([place]) 08/27/2015  . Chronic back pain 08/27/2015  . Hypertension 08/27/2015  . OSA (obstructive sleep apnea) 08/27/2015  . Depression 08/27/2015  . Elevated troponin level 08/27/2015  . New onset atrial fibrillation ([place]) 08/27/2015  . Severe sepsis ([place]) 08/27/2015  . Acute respiratory failure with hypoxia ([place]) 08/27/2015  . Lumbar pseudoarthrosis 01/08/2012    Orientation RESPIRATION BLADDER Height & Weight     Self, Time, Situation, Place  Normal, Other (Comment)(Cpap on auto titrate: 20 high, 5 low. 5 L bled in. Notes say he is in the process of getting a machine at home.) Continent, Indwelling catheter Weight: (!) 312 lb 9.8 oz (141.8 kg) Height:  5' 8" (172.7 cm)  BEHAVIORAL SYMPTOMS/MOOD NEUROLOGICAL BOWEL NUTRITION STATUS  (None) (None) Incontinent Diet(DYS 2. Fluid pudding thick. No liquids.)  AMBULATORY STATUS COMMUNICATION OF NEEDS Skin   Extensive Assist Verbally Surgical wounds, Other  (Comment)(Blister, Cellulitis, MASD.)                       Personal Care Assistance Level of Assistance  Bathing, Feeding, Dressing Bathing Assistance: Maximum assistance Feeding assistance: Maximum assistance Dressing Assistance: Maximum assistance     Functional Limitations Info  Sight, Hearing, Speech Sight Info: Adequate Hearing Info: Adequate Speech Info: Adequate    SPECIAL CARE FACTORS FREQUENCY  PT (By licensed PT), OT (By licensed OT), Speech therapy     PT Frequency: 5 x week OT Frequency: 5 x week     Speech Therapy Frequency: 5 x week      Contractures Contractures Info: Not present    Additional Factors Info  Code Status, Allergies Code Status Info: Full Allergies Info: Ibuprofen, Gabapentin.           Current Medications (06/15/2018):  This is the current hospital active medication list Current Facility-Administered Medications  Medication Dose Route Frequency Provider Last Rate Last Dose  . 0.9 %  sodium chloride infusion  250 mL Intravenous Continuous Pool, Mallie Mussel, MD      . 0.9 %  sodium chloride infusion   Intra-arterial PRN Debbe Odea, MD      . acetaminophen (TYLENOL) tablet 1,000 mg  1,000 mg Oral Q8H PRN Chesley Mires, MD   1,000 mg at 06/14/18 1212  . atorvastatin (LIPITOR) tablet 20 mg  20 mg Oral q1800 Chesley Mires, MD   20 mg at 06/14/18 1719  . bisacodyl (DULCOLAX) suppository 10 mg  10 mg Rectal Daily PRN Kristeen Miss, MD   10 mg at  06/12/18 1740  . celecoxib (CELEBREX) capsule 200 mg  200 mg Oral BID Chesley Mires, MD   200 mg at 06/15/18 1003  . chlorhexidine gluconate (MEDLINE KIT) (PERIDEX) 0.12 % solution 15 mL  15 mL Mouth Rinse BID Jennelle Human B, NP   15 mL at 06/15/18 0844  . cholecalciferol (VITAMIN D3) tablet 5,000 Units  5,000 Units Oral Daily Earnie Larsson, MD   5,000 Units at 06/15/18 1001  . cyclobenzaprine (FLEXERIL) tablet 10 mg  10 mg Oral TID PRN Chesley Mires, MD   10 mg at 06/14/18 2347  . diltiazem (CARDIZEM)  tablet 30 mg  30 mg Oral Q6H Rizwan, Saima, MD   30 mg at 06/15/18 1159  . DULoxetine (CYMBALTA) DR capsule 90 mg  90 mg Oral Daily Earnie Larsson, MD   90 mg at 06/15/18 1003  . enoxaparin (LOVENOX) injection 154 mg  1 mg/kg Subcutaneous Q12H Wynell Balloon, RPH      . fentaNYL (SUBLIMAZE) injection 25-50 mcg  25-50 mcg Intravenous Q3H PRN Viona Gilmore D, NP   50 mcg at 06/15/18 0242  . lactated ringers infusion   Intravenous PRN Chesley Mires, MD      . MEDLINE mouth rinse  15 mL Mouth Rinse q12n4p Earnie Larsson, MD   15 mL at 06/15/18 1232  . mirtazapine (REMERON) tablet 15 mg  15 mg Oral QHS Chesley Mires, MD   15 mg at 06/14/18 2100  . multivitamin (PROSIGHT) tablet 1 tablet  1 tablet Oral Daily Earnie Larsson, MD   1 tablet at 06/15/18 1002  . ondansetron (ZOFRAN) tablet 4 mg  4 mg Oral Q6H PRN Earnie Larsson, MD       Or  . ondansetron [facility]) injection 4 mg  4 mg Intravenous Q6H PRN Earnie Larsson, MD      . oxyCODONE (Oxy IR/ROXICODONE) immediate release tablet 5 mg  5 mg Oral Q6H PRN Viona Gilmore D, NP   5 mg at 06/15/18 1233  . RESOURCE THICKENUP CLEAR   Oral PRN Earnie Larsson, MD      . sodium chloride flush (NS) 0.9 % injection 3 mL  3 mL Intravenous Q12H Earnie Larsson, MD   3 mL at 06/15/18 1003  . sodium chloride flush (NS) 0.9 % injection 3 mL  3 mL Intravenous PRN Earnie Larsson, MD      . tamsulosin (FLOMAX) capsule 0.4 mg  0.4 mg Oral Daily Earnie Larsson, MD   0.4 mg at 06/15/18 1001  . tobramycin (TOBREX) 0.3 % ophthalmic solution 1 drop  1 drop Right Eye TID Earnie Larsson, MD   1 drop at 06/15/18 1004     Discharge Medications: Please see discharge summary for a list of discharge medications.  Relevant Imaging Results:  Relevant Lab Results:   Additional Information SS#: [id number]  Candie Chroman, LCSW

## 2018-06-15 NOTE — Progress Notes (Signed)
  NEUROSURGERY PROGRESS NOTE   No new issues overnight. No complaints this am.  EXAM:  BP (!) 103/45   Pulse 75   Temp 98.8 F (37.1 C) (Oral)   Resp (!) 25   Ht 5\' 8"  (1.727 m)   Wt (!) 141.8 kg   SpO2 92%   BMI 47.53 kg/m   Awake, alert, oriented  Speech fluent, appropriate  CN grossly intact  5/5 BUE 4/5 proximal BLE, good distal strength  IMPRESSION:  77 y.o. male POD# 8 s/p T11-12 laminectomy, postop ARF requiring re-intubation. Now extubated with slowly improving dysphagia - Afib with RVR  PLAN: - Cont diet recs per SLP - Cont PT/OT - Appreciate medicine assistance, cont cardizem PO

## 2018-06-16 ENCOUNTER — Inpatient Hospital Stay (HOSPITAL_COMMUNITY): Payer: Medicare HMO

## 2018-06-16 ENCOUNTER — Encounter (HOSPITAL_COMMUNITY): Payer: Self-pay | Admitting: Nurse Practitioner

## 2018-06-16 DIAGNOSIS — I5030 Unspecified diastolic (congestive) heart failure: Secondary | ICD-10-CM

## 2018-06-16 DIAGNOSIS — I480 Paroxysmal atrial fibrillation: Secondary | ICD-10-CM

## 2018-06-16 LAB — BASIC METABOLIC PANEL
Anion gap: 12 (ref 5–15)
BUN: 43 mg/dL — ABNORMAL HIGH (ref 8–23)
CO2: 27 mmol/L (ref 22–32)
Calcium: 9.5 mg/dL (ref 8.9–10.3)
Chloride: 109 mmol/L (ref 98–111)
Creatinine, Ser: 1.03 mg/dL (ref 0.61–1.24)
GFR calc non Af Amer: >60 mL/min (ref >60)
Glucose, Bld: 199 mg/dL — ABNORMAL HIGH (ref 70–99)
Potassium: 4 mmol/L (ref 3.5–5.1)
Sodium: 148 mmol/L — ABNORMAL HIGH (ref 135–145)

## 2018-06-16 MED ORDER — METOPROLOL TARTRATE 50 MG PO TABS
50.0000 mg | ORAL_TABLET | Freq: Two times a day (BID) | ORAL | Status: DC
  Administered 2018-06-16 – 2018-06-28 (×23): 50 mg via ORAL
  Filled 2018-06-16 (×24): qty 1

## 2018-06-16 MED ORDER — METOPROLOL TARTRATE 25 MG PO TABS
25.0000 mg | ORAL_TABLET | Freq: Two times a day (BID) | ORAL | Status: DC
  Administered 2018-06-16: 25 mg via ORAL
  Filled 2018-06-16: qty 1

## 2018-06-16 MED ORDER — METOPROLOL TARTRATE 5 MG/5ML IV SOLN
5.0000 mg | INTRAVENOUS | Status: DC | PRN
  Administered 2018-06-16: 5 mg via INTRAVENOUS
  Filled 2018-06-16: qty 5

## 2018-06-16 NOTE — Progress Notes (Signed)
Patient ID: [name], male   DOB: June 16, 1941, 77 y.o.   MRN: [id number] Patient complains of mid back pain.  He was just sitting on the side of the bed for about 10 minutes.  He is in A. fib with RVR.  This is stable.  Lower extremity strength is stable when compared to previous notes.  Distal strength with some proximal weakness.  Continue to mobilize with therapy.

## 2018-06-16 NOTE — Progress Notes (Signed)
Condom cath replaced

## 2018-06-16 NOTE — Consult Note (Addendum)
Cardiology Consult    Patient ID: ALOIS COLGAN MRN: [id number], DOB/AGE: [birth year]   Admit date: 06/07/2018 Date of Consult: 06/16/2018  Primary Physician: Nicola Girt, DO Primary Cardiologist: [phone] Requesting Provider: H. Pool, MD  Patient Profile    LATRELL POTEMPA is a 77 y.o. male with a history of degenerative disc dzs s/p multiple spinal surgeries, PAF, HTN, chronic pain, thoracic Ao aneurysm, anxiety, depression, and OSA, who is being seen today for the evaluation of rapid afib at the request of Dr. Wynelle Cleveland.  Past Medical History   Past Medical History:  Diagnosis Date  . Anxiety   . Arthritis    back,joints,neck-limited mobility,flexion-  . BPH (benign prostatic hyperplasia)   . Carpal tunnel syndrome   . Chronic pain   . Constipation due to pain medication   . Coronary artery calcification seen on CT scan    a. 02/2018 CT chest [facility]): coronary atherosclerosis w/ mild amt of Ca2+ plaque in LAD distribution; b. 05/2018 MV ([facility]): EF 50%, no ischemia/infarct. Low risk.  . Depression   . GERD (gastroesophageal reflux disease)   . Headache(784.0)   . Hepatitis B    "years ago"  . History of echocardiogram    a. 08/2015 Echo: EF 55-60%, no rwma, mild AI, mildly dil LA. Nl PASP.  Marland Kitchen History of pulmonary embolism   . Hypertension   . Lumbar neuralgia   . PAF (paroxysmal atrial fibrillation) ([place]) 08/31/2015   a. Dx 08/2015 in setting of ureteral stone & pain-->initially placed on eliquis, later d/c'd by primary cardiologist; c. CHA2DS2VASc = 4-5.  Marland Kitchen Sleep apnea 1990's   a. on AutoPap.  . Thoracic aortic aneurysm ([place]) 02/25/2018   a. 02/2018 CT Chest [facility]): Asc thoracic Ao dil - max 4.6cm. Ao root 3.9cm.  . Urinary frequency     Past Surgical History:  Procedure Laterality Date  . APPENDECTOMY    . BACK SURGERY     7 lumbar surgeries  . CYSTOSCOPY WITH RETROGRADE PYELOGRAM, URETEROSCOPY AND STENT PLACEMENT Left 08/27/2015   Procedure: CYSTOSCOPY WITH BILATERAL  RETROGRADE PYELOGRAM, URETEROSCOPY AND BILATERAL STENT PLACEMENT;  Surgeon: Alexis Frock, MD;  Location: [facility];  Service: Urology;  Laterality: Left;  . CYSTOSCOPY/URETEROSCOPY/HOLMIUM LASER/STENT PLACEMENT Left 10/18/2015   Procedure: CYSTOSCOPY/URETEROSCOPY/HOLMIUM LASER/STENT PLACEMENT;  Surgeon: Kathie Rhodes, MD;  Location: [facility];  Service: Urology;  Laterality: Left;  . JOINT REPLACEMENT Right 1990's   Rt shoulder  . JOINT REPLACEMENT Right 2013   knee  . LUMBAR LAMINECTOMY/DECOMPRESSION MICRODISCECTOMY N/A 06/07/2018   Procedure: Laminectomy - T11-T12;  Surgeon: Earnie Larsson, MD;  Location: [place];  Service: Neurosurgery;  Laterality: N/A;     Allergies  Allergies  Allergen Reactions  . Ibuprofen Hives and Rash  . Gabapentin Other (See Comments)    "makes me loopy"    History of Present Illness    77 year old male with the above complex past medical history including degenerative disc disease status post multiple spinal surgeries with chronic pain, hypertension, paroxysmal atrial fibrillation, thoracic aortic aneurysm, anxiety, depression, morbid obesity, and sleep apnea.  His atrial fibrillation history dates back to March 2017, at which time he was admitted with pain in the setting of ureteral stone and was noted to be in rapid A. fib.  He subsequently converted to sinus rhythm and was placed on Eliquis therapy with recommendation for him to follow-up with his cardiologist in [facility].  At that time, he was seen Dr. Atilano Median, though he has  not been seen by cardiology since late 2017.  He subsequently came off of anticoagulation at the recommendation of his neurosurgeon secondary to falls.  He is not aware of any recurrent A. fib over the past 2 years.  He does experience occasional chest discomfort that can last for up to 2 weeks and is worse with position changes.  He also has chronic dyspnea on exertion, though his activity is markedly limited secondary to severe  back pain.  In the setting of immobility, he has a very unsteady gait and occasionally falls.  His son thinks that he is probably fallen 5 or 6 times this year.   Earlier this year, he apparently underwent echocardiography that showed a dilated aorta.  This was followed by CT angios of the chest which showed a thoracic aortic aneurysm with a maximum dilatation of 4.6 cm.  He was seen by cardiothoracic surgery in the [facility] system and he was not felt to require intervention at this time.  In the setting of ongoing back pain, he is also been evaluated by neurosurgery here in [place] with plan for T11-12 decompressive laminectomy for symptom management.  He underwent cardiac stress testing in early December as part of a preoperative evaluation, and this was negative for ischemia.    On December 20, he underwent T11-T12 decompressive laminectomy.  Postoperatively while in PACU, he was extubated but then began experiencing incisional pain and developed rapid atrial fibrillation with rates in the 140s.  He was placed on diltiazem infusion and required reintubation by the critical care team.  He was admitted to the intensive care unit where he remained intubated until December 24.  He remained on IV diltiazem for about a day and then this was switched to as needed oral diltiazem and subsequently oral beta-blocker.  He has remained in A. fib with frequent PVCs.  There is one ECG from December 21, where he was in sinus with frequent PVCs the review of telemetry shows persistent A. fib more or less since then.  Patient denies chest pain, dyspnea, or palpitations.  He has a headache, which is chronic.  His mobility has been limited since extubation and thus work with PT and OT delayed.  He is being considered for skilled nursing facility.  Inpatient Medications    . atorvastatin  20 mg Oral q1800  . celecoxib  200 mg Oral BID  . chlorhexidine gluconate (MEDLINE KIT)  15 mL Mouth Rinse BID  . cholecalciferol   5,000 Units Oral Daily  . DULoxetine  90 mg Oral Daily  . enoxaparin (LOVENOX) injection  0.5 mg/kg Subcutaneous Q24H  . mouth rinse  15 mL Mouth Rinse q12n4p  . metoprolol tartrate  25 mg Oral BID  . mirtazapine  15 mg Oral QHS  . multivitamin  1 tablet Oral Daily  . sodium chloride flush  3 mL Intravenous Q12H  . tamsulosin  0.4 mg Oral Daily  . tobramycin  1 drop Right Eye TID    Family History    Family History  Problem Relation Age of Onset  . Cancer Mother   . Heart disease Mother    He indicated that his mother is deceased. He indicated that his father is deceased.  Social History    Social History   Socioeconomic History  . Marital status: Married    Spouse name: Not on file  . Number of children: Not on file  . Years of education: Not on file  . Highest education level: Not  on file  Occupational History  . Not on file  Social Needs  . Financial resource strain: Not on file  . Food insecurity:    Worry: Not on file    Inability: Not on file  . Transportation needs:    Medical: Not on file    Non-medical: Not on file  Tobacco Use  . Smoking status: Never Smoker  . Smokeless tobacco: Current User    Types: Snuff  Substance and Sexual Activity  . Alcohol use: No  . Drug use: No  . Sexual activity: Not on file  Lifestyle  . Physical activity:    Days per week: Not on file    Minutes per session: Not on file  . Stress: Not on file  Relationships  . Social connections:    Talks on phone: Not on file    Gets together: Not on file    Attends religious service: Not on file    Active member of club or organization: Not on file    Attends meetings of clubs or organizations: Not on file    Relationship status: Not on file  . Intimate partner violence:    Fear of current or ex partner: Not on file    Emotionally abused: Not on file    Physically abused: Not on file    Forced sexual activity: Not on file  Other Topics Concern  . Not on file  Social  History Narrative   Lives in [place].  Does not routinely exercise.  Mobility severely limited by chronic back pain.     Review of Systems    General:  No chills, fever, night sweats or weight changes.  Cardiovascular: Occasional chest wall pain that can last weeks at a time.  He denies, dyspnea on exertion, orthopnea, palpitations, paroxysmal nocturnal dyspnea. Chronic mild lower ext edema. Dermatological: No rash, lesions/masses Respiratory: No cough, dyspnea Urologic: No hematuria, dysuria Abdominal:   No nausea, vomiting, diarrhea, bright red blood per rectum, melena, or hematemesis Neurologic:  No visual changes, wkns, changes in mental status. Musculoskeletal: Chronic back pain. All other systems reviewed and are otherwise negative except as noted above.  Physical Exam    Blood pressure 137/74, pulse (!) 116, temperature (!) 97.5 F (36.4 C), temperature source Oral, resp. rate (!) 21, height '5\' 8"'  (1.727 m), weight (!) 141.2 kg, SpO2 97 %.  General: Pleasant, some increase in work of breathing. Psych: Flat affect. Neuro: Alert and oriented X 3. Moves all extremities spontaneously. HEENT: Normal  Neck: Supple without bruits.  Body habitus prevents evaluation of JVP.   Lungs: Speech is somewhat labored.  Markedly diminished breath sounds bilaterally. Marland Kitchen Heart: IR IR, no s3, s4, or murmurs. Abdomen: Obese, Soft, non-tender, non-distended, BS + x 4.  Extremities: No clubbing, cyanosis or edema. DP/Radials 1+ and equal bilaterally.  Labs    Troponin [facility] Test) Lab Results  Component Value Date   CKTOTAL 64 08/26/2015   TROPONINI 0.04 (HH) 06/08/2018     Lab Results  Component Value Date   WBC 11.3 (H) 06/15/2018   HGB 13.6 06/15/2018   HCT 44.5 06/15/2018   MCV 94.9 06/15/2018   PLT 257 06/15/2018    Recent Labs  Lab 06/16/18 0416  NA 148*  K 4.0  CL 109  CO2 27  BUN 43*  CREATININE 1.03  CALCIUM 9.5  GLUCOSE 199*   Lab Results  Component  Value Date   TRIG 164 (H) 06/10/2018  Radiology Studies    Dg Thoracic Spine 1 View  Result Date: 06/07/2018 CLINICAL DATA:  Spinal stenosis EXAM: OPERATIVE THORACIC SPINE 1 VIEW(S) COMPARISON:  04/25/2017 FINDINGS: Tissue spreaders are in place posteriorly at T11-12. Probe marks this level. IMPRESSION: T11-12 localized. Electronically Signed   By: Nelson Chimes M.D.   On: 06/07/2018 15:32   Dg Chest Port 1 View  Result Date: 06/15/2018 CLINICAL DATA:  Hypoxia EXAM: PORTABLE CHEST 1 VIEW COMPARISON:  Five days ago FINDINGS: Interval extubation. Chronic cardiomegaly. Low volumes but improved inflation with now visible left diaphragm. No edema, effusion, or pneumothorax. IMPRESSION: Low volume chest but improved inflation compared to study 5 days ago. Chronic cardiomegaly. Electronically Signed   By: Monte Fantasia M.D.   On: 06/15/2018 09:31   Dg Chest Port 1 View  Result Date: 06/10/2018 CLINICAL DATA:  77 year old male with respiratory failure status post decompressive laminectomy on 85/88/5027 complicated by atrial fibrillation with rapid ventricular response EXAM: PORTABLE CHEST 1 VIEW COMPARISON:  Prior chest x-ray 06/09/2018 FINDINGS: The patient remains intubated. The tip of the endotracheal tube is 4.3 cm above the carina. A gastric tube is present. The tip of the tube lies below the diaphragm, off the field of view. Stable cardiomegaly and pulmonary vascular congestion. Probable bilateral layering pleural effusions and associated bibasilar airspace opacities. No pneumothorax. There may be slightly improved interstitial edema compared to yesterday's radiograph. IMPRESSION: 1. Stable and satisfactory support apparatus. 2. Persistent pulmonary vascular congestion but slightly improved interstitial edema compared to yesterday. 3. Persistent bibasilar opacities likely reflecting a combination of a small layering effusions with atelectasis. Electronically Signed   By: Jacqulynn Cadet M.D.    On: 06/10/2018 07:42   Dg Chest Port 1 View  Result Date: 06/09/2018 CLINICAL DATA:  Respiratory failure.  T11-12 laminectomy 06/07/2018. EXAM: PORTABLE CHEST 1 VIEW COMPARISON:  06/08/2018 FINDINGS: Endotracheal tube with tip 6 cm above the carina. Nasogastric tube courses into the region of the gastroesophageal junction and off the film as tip is not visualized. Lungs are hypoinflated with possible slight worsening right base opacification likely small effusion with associated atelectasis. Mild left perihilar/basilar opacification. Stable cardiomegaly. Remainder the exam is unchanged. IMPRESSION: Slight worsening right base opacification likely small to moderate effusion with associated atelectasis. Mild left perihilar and basilar opacification which may be due to vascular congestion with minimal linear basilar atelectasis. Stable cardiomegaly. Tubes and lines as described. Electronically Signed   By: Marin Olp M.D.   On: 06/09/2018 08:35   Portable Chest Xray  Result Date: 06/08/2018 CLINICAL DATA:  Endotracheal tube placement. EXAM: PORTABLE CHEST 1 VIEW COMPARISON:  06/07/2018 FINDINGS: Endotracheal tube appears to be approximately 6 cm above the carina, although the carina is not well visualized. Enteric tube courses into the region of the stomach and off the inferior portion of the film as tip is not visualized. Lungs are hypoinflated as patient is mildly rotated to the right. There is persistent mild bibasilar opacification likely atelectasis and possible small amount right pleural fluid unchanged. Mild stable cardiomegaly. Remainder of the exam is unchanged. IMPRESSION: Stable mild bibasilar opacification likely atelectasis with small amount right pleural fluid. Mild stable cardiomegaly. Tubes and lines as described. Electronically Signed   By: Marin Olp M.D.   On: 06/08/2018 08:42   Portable Chest X-ray  Result Date: 06/07/2018 CLINICAL DATA:  ET and OG tube plmt EXAM: PORTABLE CHEST  1 VIEW COMPARISON:  None. FINDINGS: Tracheal tube 4 cm from carina in good position. Low  lung volumes. Basilar atelectasis. No pulmonary edema. NG tube extends the stomach. IMPRESSION: Endotracheal tube in good position.  NG tube in stomach. Low lung volumes. Electronically Signed   By: Suzy Bouchard M.D.   On: 06/07/2018 21:50   Dg Swallowing Func-speech Pathology  Result Date: 06/13/2018 Objective Swallowing Evaluation: Type of Study: MBS-Modified Barium Swallow Study  Patient Details Name: KEANAN MELANDER MRN: [id number] Date of Birth: 1941-01-10 Today's Date: 06/13/2018 Time: SLP Start Time (ACUTE ONLY): 1225 -SLP Stop Time (ACUTE ONLY): 1245 SLP Time Calculation (min) (ACUTE ONLY): 20 min Past Medical History: Past Medical History: Diagnosis Date . Aortic aneurysm ([place]) 02/25/2018 . Arthritis   back,joints,neck-limited mobility,flexion- . Constipation due to pain medication  . Depression  . Dysrhythmia 08/31/2015  AF new onset    /NSR noted 09/23/15 . Headache(784.0)  . Hepatitis  . Hepatitis B   "years ago" . Hypertension  . Lumbar neuralgia  . Sleep apnea 1990's . Urinary frequency  Past Surgical History: Past Surgical History: Procedure Laterality Date . APPENDECTOMY   . BACK SURGERY    7 lumbar surgeries . CYSTOSCOPY WITH RETROGRADE PYELOGRAM, URETEROSCOPY AND STENT PLACEMENT Left 08/27/2015  Procedure: CYSTOSCOPY WITH BILATERAL RETROGRADE PYELOGRAM, URETEROSCOPY AND BILATERAL STENT PLACEMENT;  Surgeon: Alexis Frock, MD;  Location: [facility];  Service: Urology;  Laterality: Left; . CYSTOSCOPY/URETEROSCOPY/HOLMIUM LASER/STENT PLACEMENT Left 10/18/2015  Procedure: CYSTOSCOPY/URETEROSCOPY/HOLMIUM LASER/STENT PLACEMENT;  Surgeon: Kathie Rhodes, MD;  Location: [facility];  Service: Urology;  Laterality: Left; . JOINT REPLACEMENT Right 1990's  Rt shoulder . JOINT REPLACEMENT Right 2013  knee . LUMBAR LAMINECTOMY/DECOMPRESSION MICRODISCECTOMY N/A 06/07/2018  Procedure: Laminectomy - T11-T12;  Surgeon:  Earnie Larsson, MD;  Location: [place];  Service: Neurosurgery;  Laterality: N/A; HPI: Pt is a 77 yo male with severe spinal stenosis s/p T11-T12 decompressive laminectomy 12/20.  He was extubated post-op, but then developed respiratory failure from pain medications in setting of OSA and required re-intubation 12/20-12/24. PMH: OSA, A fib, Thoracic aortic aneurysm, Diastolic CHF, Arthritis, depression, Hep B  Subjective: pt alert,  hoarse Assessment / Plan / Recommendation CHL IP CLINICAL IMPRESSIONS 06/13/2018 Clinical Impression Pt demosntrates a severe dysphagia with mild oral deficits including fatigue during mastication of solids, and slight delay in swallow initiation leading to consistent sensed and silent aspiration of all liquids before the swallow. 1/2 teaspoon quantity of honey is tolerated with only trace penetration, any larger boluses are aspirated. There is also mildly reduced epiglottic deflection due to bulge of posterior pharyngeal wall and mild base of tongue weakness. Pt may consume finely chopped solids or pudding thick liquids. He is quite impulsive and though he could also drink honey thick liquids via teaspoon with total assist, he would be expected to drink any drinks within reach; safety awareness is poor. Question if recent intubation has worsened sensation for swallow trigger or if pt may have been struggling with timing of swallow prior to admission. Will f/u for evidence of improvement. Pt cannot follow positional strategies due to poor neck ROM so therapeutic interventions may be limited.  SLP Visit Diagnosis Dysphagia, oropharyngeal phase (R13.12) Attention and concentration deficit following -- Frontal lobe and executive function deficit following -- Impact on safety and function Severe aspiration risk   CHL IP TREATMENT RECOMMENDATION 06/12/2018 Treatment Recommendations Therapy as outlined in treatment plan below   Prognosis 06/13/2018 Prognosis for Safe Diet Advancement Fair Barriers  to Reach Goals Severity of deficits Barriers/Prognosis Comment -- CHL IP DIET RECOMMENDATION 06/13/2018 SLP Diet Recommendations Dysphagia 2 (  Fine chop) solids;No liquids;Pudding thick liquid Liquid Administration via -- Medication Administration Whole meds with puree Compensations Slow rate;Small sips/bites Postural Changes Seated upright at 90 degrees   CHL IP OTHER RECOMMENDATIONS 06/13/2018 Recommended Consults -- Oral Care Recommendations Oral care BID Other Recommendations --   CHL IP FOLLOW UP RECOMMENDATIONS 06/13/2018 Follow up Recommendations Inpatient Rehab   CHL IP FREQUENCY AND DURATION 06/13/2018 Speech Therapy Frequency (ACUTE ONLY) min 2x/week Treatment Duration 2 weeks      CHL IP ORAL PHASE 06/13/2018 Oral Phase Impaired Oral - Pudding Teaspoon -- Oral - Pudding Cup -- Oral - Honey Teaspoon WFL Oral - Honey Cup WFL Oral - Nectar Teaspoon WFL Oral - Nectar Cup WFL Oral - Nectar Straw WFL Oral - Thin Teaspoon -- Oral - Thin Cup -- Oral - Thin Straw -- Oral - Puree WFL Oral - Mech Soft -- Oral - Regular Decreased bolus cohesion;Delayed oral transit Oral - Multi-Consistency -- Oral - Pill -- Oral Phase - Comment --  CHL IP PHARYNGEAL PHASE 06/13/2018 Pharyngeal Phase Impaired Pharyngeal- Pudding Teaspoon -- Pharyngeal -- Pharyngeal- Pudding Cup -- Pharyngeal -- Pharyngeal- Honey Teaspoon Delayed swallow initiation-pyriform sinuses;Delayed swallow initiation-vallecula Pharyngeal -- Pharyngeal- Honey Cup Delayed swallow initiation-pyriform sinuses;Delayed swallow initiation-vallecula;Penetration/Aspiration before swallow;Reduced tongue base retraction;Pharyngeal residue - valleculae Pharyngeal Material enters airway, passes BELOW cords without attempt by patient to eject out (silent aspiration);Material does not enter airway;Material enters airway, remains ABOVE vocal cords and not ejected out Pharyngeal- Nectar Teaspoon Delayed swallow initiation-pyriform sinuses;Delayed swallow  initiation-vallecula;Penetration/Aspiration before swallow;Reduced tongue base retraction;Pharyngeal residue - valleculae;Significant aspiration (Amount) Pharyngeal Material enters airway, passes BELOW cords and not ejected out despite cough attempt by patient Pharyngeal- Nectar Cup Delayed swallow initiation-pyriform sinuses;Delayed swallow initiation-vallecula;Penetration/Aspiration before swallow;Reduced tongue base retraction;Pharyngeal residue - valleculae Pharyngeal Material enters airway, passes BELOW cords then ejected out;Material enters airway, passes BELOW cords and not ejected out despite cough attempt by patient Pharyngeal- Nectar Straw Delayed swallow initiation-pyriform sinuses;Penetration/Aspiration before swallow Pharyngeal Material enters airway, passes BELOW cords and not ejected out despite cough attempt by patient Pharyngeal- Thin Teaspoon -- Pharyngeal -- Pharyngeal- Thin Cup -- Pharyngeal -- Pharyngeal- Thin Straw -- Pharyngeal -- Pharyngeal- Puree Delayed swallow initiation-vallecula;Reduced tongue base retraction;Pharyngeal residue - valleculae Pharyngeal -- Pharyngeal- Mechanical Soft -- Pharyngeal -- Pharyngeal- Regular Pharyngeal residue - valleculae;Reduced tongue base retraction Pharyngeal -- Pharyngeal- Multi-consistency -- Pharyngeal -- Pharyngeal- Pill -- Pharyngeal -- Pharyngeal Comment --  No flowsheet data found. Herbie Baltimore, MA CCC-SLP Acute Rehabilitation Services Pager [phone] Office [phone] Lynann Beaver 06/13/2018, 2:21 PM               ECG & Cardiac Imaging    December 20: Atrial fibrillation, 127, nonspecific ST changes.- personally reviewed. December 21: Sinus rhythm, 85, frequent PVCs  Telemetry: Atrial fibrillation with frequent PVCs.  Rates mostly 100-110.  Assessment & Plan    1.  Paroxysmal atrial fibrillation: Prior history of paroxysmal atrial fibrillation dating back to March 2017, at which time he was admitted with pain and  nephrolithiasis.  This time, patient admitted for T11-12 laminectomy and postoperatively developed respiratory failure and pain with development of rapid atrial fibrillation.  Rates were initially managed with IV diltiazem and then subsequently converted to prn oral dilt and prn iv metoprolol.  He is now on oral beta-blocker therapy.  Rate slightly better today in the low 100s.  He denies any symptoms of chest pain, palpitations, or dyspnea.  CHA2DS2VASc is 5 in the setting of diastolic heart failure, hypertension, age,  and known coronary calcification. He had been on Eliquis in the past and at some point it was discontinued in the setting of falls.  He does report unsteadiness with somewhat frequent falls though he has not sustained any significant trauma.  As he is most likely going to a skilled nursing facility following discharge, if cleared by neurosurgery, we would recommend initiation of Eliquis 5 mg twice daily.  We will not be able to pursue a rhythm control strategy in the absence of anticoagulation.  If HR adequately controlled rates on beta-blocker, we could consider a rhythm control strategy after 3 weeks of continuous anticoagulation.  With his body habitus and recent respiratory failure, he is not an ideal candidate for TEE.  Electrolytes stable.  Follow-up TSH and echo.  2.  Acute respiratory failure: Resolved.  Extubated December 24.  3.  Chronic back pain/degenerative disc disease: Status post T11-12 laminectomy.  Pain management per internal medicine.  As above, if cleared by neurosurgery, we would recommend oral anticoagulation.    4.  Essential hypertension: Blood pressures have been stable.  We will titrate metoprolol.  5.  HFpEF: He is -8 L for admission and weight is down 10 kg.  He was on IV Lasix up until 2 days ago.  Exam is challenging secondary to body habitus.  Titrate beta-blocker for improved heart rate and blood pressure.  Continue to follow intake and output with daily  weights.  6.  Elevated troponin: Mild troponin elevation on December 21-0.04.  This was in the setting of respiratory failure and rapid A. fib.  Likely secondary to demand ischemia.  Recent nonischemic stress testing through the [facility] system.  Result reviewed.  Follow-up echo.  Continue beta-blocker and statin.  No plan for repeat ischemic evaluation at this time.  7.  Obstructive sleep apnea: On CPAP.  8.  Frequent PVCs/bigeminy: Potassium, magnesium within normal limits.  Follow-up echo.  Continue beta-blocker.  Signed, Murray Hodgkins, NP 06/16/2018, 12:12 PM  Patient seen and examined   I agree with findings as noted by Angelica Ran above    Pt is a 77 yo with history of PAF  Was on anticoagulation in past.  Stopped because of concern for fall risk The pt is currently post op from T11/12 laminectomy   Post op developed repiratory failure and afib with RVR    Treated with IV dilt and then metoprolol  Rates improved   On exam, pt is a frail obese 77 yo in nad Neck;  Full Lungs   Poor inspriratory effrot  No definite rales Cardiac :  Irreg irreg   No s3   Ext are without edema   1   Atrial fibrillation   He seems to be tolerating afib hemodynmically   Concern is his stroke risk if he is not anticoagulated (CHADSVASc is 5)   WOuld recomm Eliquis when OK with surgery  2  Diastolic CHF   Pt has diuresed since admit   Exam difficult   Does not appear markedly volume overloaded   Follow I/Os, wts.   3   PVCs   Normal LVEF    Low risk myovue     Make sure electrolytes stay optimized  Dorris Carnes MD    For questions or updates, please contact   Please consult [email or web address] for contact info under Cardiology/STEMI.

## 2018-06-16 NOTE — Progress Notes (Addendum)
PROGRESS NOTE    [name]   BLT:[id number]  DOB: 03/28/1941  DOA: 06/07/2018 PCP: Nicola Girt, DO   Brief Narrative:  [name] is a 77 y.o. male with medical history significant of history of thoracic aortic aneurysm, hyperlipidemia, depression, hypertension, BPH, heart failure who underwent T11-T12 decompressive laminectomy due to severe spinal stenosis with Dr. Annette Stable on 12/20. Extubated in PACU, but upon extubation, he went into atrial fibrillation with RVR heart rate in the 140s.  Also with apneic spell due to postop anesthesia and underlying OSA.  PCCM was consulted.  He was intubated and eventually extubated on 12/24. He was transferred out of ICU and to [facility] (to act as consultants) on 12/27. He became hypotensive and tachycardic on 12/27 afternoon and was given fluid boluses. Antiarrythmic agents and antihypertensives were held I have done an extensive review of the chart including outpatient records.   Subjective: No complaints today.  Assessment & Plan:   Principal Problem:   Acute respiratory failure with hypoxia and hypercapnia  - suspected to be in relation to OSA in setting of pain medications- required intubation as mentioned above - pseudomonas + in sputum but no infiltrates or signs of infection (he had one temp spike but it did not recurr) - holding off antibiotics - no longer requiring any O2  Active Problems:   Parosyxmal A-fib   - not on any rate controlling agent at baseline and only on aspirin for secondary stroke prophylaxis - follows with cardiology at [facility] - cannot put on full dose anticoagulation at this time as he is post op laminectomy (unless OK with NS) - HR currently in still 120-130- oral Cardizem started but not effective - switched to Lopressor - will ask for a cardiology eval today- discussed with Dr Orene Desanctis  Right heart failure and pulm HTN  - mentioned in recent outpt IM note  - ECHO in care everywhere  01/14/18>  Left ventricle cavity size is normal.  Normal left ventricular systolic function  Ejection fraction is visually estimated at 00-76%  Diastolic function appears indeterminate.  Mild concentric left ventricular hypertrophy  Mild Dilation of the aortic root (4.26 cm)  Moderate to severe dilation of the Ascending Aorta (4.97 cm)  The IVC is not well visualized.   Essential HTN - cont Losartan- on 20 mg Lasix daily at home for right heart failure per IM note- no signs of fluid overload at this time- received a number of doses of IV Lasix early on in hospital stay    Morbid obesity     OSA (obstructive sleep apnea) - Body mass index is 47.33 kg/m.  - CPAP ordered- in process of getting one as outpt  Acute hypotensive episode on 22/63 - I am uncertain of the cause as I was not the attending at the time- ? Due to diuresis- improved after IVF given  Mild AKI, hypernatremia - ? Due to diuresis- given fluids on 12/27- - follow   Hypokalemia - replaced- Mg normal    Spinal stenosis, thoracic - per primary team- s/p T11-T12 decompressive laminectomy- very weak    BPH (benign prostatic hyperplasia) - Flomax-   Foley d/c on 12/28  Thoracic Aortic Aneurysm -- followed up with CT surgery on 05/29/18 for surveillance   DVT prophylaxis: Lovenex Code Status: Full code Family Communication:  Disposition Plan: per primary team -   Antimicrobials:  Anti-infectives (From admission, onward)   Start     Dose/Rate Route Frequency Ordered  Stop   06/07/18 2300  ceFAZolin (ANCEF) IVPB 1 g/50 mL premix     1 g 100 mL/hr over 30 Minutes Intravenous Every 8 hours 06/07/18 1957 06/08/18 1900   06/07/18 1513  vancomycin (VANCOCIN) powder  Status:  Discontinued       As needed 06/07/18 1513 06/07/18 1733   06/07/18 1512  bacitracin 50,000 Units in sodium chloride 0.9 % 500 mL irrigation  Status:  Discontinued       As needed 06/07/18 1512 06/07/18 1733   06/07/18 1400  ceFAZolin  (ANCEF) 3 g in dextrose 5 % 50 mL IVPB     3 g 100 mL/hr over 30 Minutes Intravenous To [facility]Stay [facility] 06/06/18 1135 06/07/18 2030       Objective: Vitals:   06/16/18 0000 06/16/18 0125 06/16/18 0516 06/16/18 0801  BP: 119/65 108/68 (!) 145/50 137/74  Pulse: 88 (!) 102 (!) 116   Resp: (!) _0 (!) 21  Temp: 99.1 F (37.3 C) 97.6 F (36.4 C) 97.6 F (36.4 C) (!) 97.5 F (36.4 C)  TempSrc: Axillary Oral Oral Oral  SpO2: 100% 96% 97%   Weight: (!) 141.2 kg     Height:        Intake/Output Summary (Last 24 hours) at 06/16/2018 1209 Last data filed at 06/16/2018 8185 Gross per 24 hour  Intake 580 ml  Output 400 ml  Net 180 ml   Filed Weights   06/11/18 0822 06/15/18 0425 06/16/18 0000  Weight: (!) 151 kg (!) 141.8 kg (!) 141.2 kg    Examination: General exam: Appears comfortable - appears very weak HEENT: PERRLA, oral mucosa moist, no sclera icterus or thrush Respiratory system: Clear to auscultation. Respiratory effort normal. Cardiovascular system: S1 & S2 heard, IIRR, tachycardic.   Gastrointestinal system: Abdomen soft, non-tender, nondistended. Normal bowel sounds. Central nervous system: Alert and oriented. No focal neurological deficits. Extremities: No cyanosis, clubbing or edema Skin: No rashes or ulcers Psychiatry:  Mood & affect appropriate.     Data Reviewed: I have personally reviewed following labs and imaging studies  CBC: Recent Labs  Lab 06/10/18 0301 06/11/18 0154 06/13/18 0331 06/14/18 0313 06/14/18 1829 06/15/18 0453  WBC 7.7 9.5 10.0 12.0* 12.6* 11.3*  NEUTROABS 5.7  --   --   --   --   --   HGB 10.8* 11.1* 12.6* 14.2 14.3 13.6  HCT 34.9* 35.9* 42.0 46.3 45.3 44.5  MCV 95.6 95.0 95.7 94.5 94.0 94.9  PLT 138* 137* 231 274 313 631   Basic Metabolic Panel: Recent Labs  Lab 06/10/18 0301 06/11/18 0154  06/14/18 0306 06/14/18 1829 06/15/18 0453 06/15/18 2203 06/16/18 0416  NA 139 140   < > 147* 146* 147* 150* 148*  K 4.1  3.8   < > 3.1* 3.2* 3.0* 3.6 4.0  CL 99 97*   < > 100 103 103 110 109  CO2 33* 31   < > 32 _1 GLUCOSE 149* 149*   < > 170* 182* 210* 191* 199*  BUN 29* 32*   < > 48* 54* 58* 47* 43*  CREATININE 0.98 0.92   < > 1.14 1.74* 1.42* 1.16 1.03  CALCIUM 8.6* 8.6*   < > 10.0 9.7 9.4 9.6 9.5  MG 1.9 2.0  --  2.3 2.2 2.3 2.2  --   PHOS 3.6  --   --   --   --   --   --   --    < > =  values in this interval not displayed.   GFR: Estimated Creatinine Clearance: 82.8 mL/min (by C-G formula based on SCr of 1.03 mg/dL). Liver Function Tests: No results for input(s): AST, ALT, ALKPHOS, BILITOT, PROT, ALBUMIN in the last 168 hours. No results for input(s): LIPASE, AMYLASE in the last 168 hours. No results for input(s): AMMONIA in the last 168 hours. Coagulation Profile: No results for input(s): INR, PROTIME in the last 168 hours. Cardiac Enzymes: No results for input(s): CKTOTAL, CKMB, CKMBINDEX, TROPONINI in the last 168 hours. BNP (last 3 results) No results for input(s): PROBNP in the last 8760 hours. HbA1C: No results for input(s): HGBA1C in the last 72 hours. CBG: Recent Labs  Lab 06/14/18 1636 06/14/18 1950 06/14/18 2313 06/15/18 0430 06/15/18 0838  GLUCAP 168* 165* 140* 189* 244*   Lipid Profile: No results for input(s): CHOL, HDL, LDLCALC, TRIG, CHOLHDL, LDLDIRECT in the last 72 hours. Thyroid Function Tests: No results for input(s): TSH, T4TOTAL, FREET4, T3FREE, THYROIDAB in the last 72 hours. Anemia Panel: No results for input(s): VITAMINB12, FOLATE, FERRITIN, TIBC, IRON, RETICCTPCT in the last 72 hours. Urine analysis:    Component Value Date/Time   COLORURINE YELLOW 06/07/2018 2154   APPEARANCEUR CLEAR 06/07/2018 2154   LABSPEC 1.025 06/07/2018 2154   PHURINE 5.5 06/07/2018 2154   GLUCOSEU 100 (A) 06/07/2018 2154   HGBUR LARGE (A) 06/07/2018 2154   BILIRUBINUR NEGATIVE 06/07/2018 2154   KETONESUR NEGATIVE 06/07/2018 2154   PROTEINUR NEGATIVE 06/07/2018 2154    UROBILINOGEN 1.0 07/30/2010 0413   NITRITE NEGATIVE 06/07/2018 2154   LEUKOCYTESUR NEGATIVE 06/07/2018 2154   Sepsis Labs: _0 (procalcitonin:4,lacticidven:4) ) Recent Results (from the past 240 hour(s))  Culture, respiratory (non-expectorated)     Status: None   Collection Time: 06/10/18  9:09 PM  Result Value Ref Range Status   Specimen Description TRACHEAL ASPIRATE  Final   Special Requests NONE  Final   Gram Stain   Final    RARE WBC PRESENT, PREDOMINANTLY PMN RARE GRAM NEGATIVE RODS Performed at [facility], [place] [address], [place], [place] [address]    Culture MODERATE PSEUDOMONAS AERUGINOSA  Final   Report Status 06/13/2018 FINAL  Final   Organism ID, Bacteria PSEUDOMONAS AERUGINOSA  Final      Susceptibility   Pseudomonas aeruginosa - MIC*    CEFTAZIDIME 4 SENSITIVE Sensitive     CIPROFLOXACIN <=0.25 SENSITIVE Sensitive     GENTAMICIN <=1 SENSITIVE Sensitive     IMIPENEM 2 SENSITIVE Sensitive     PIP/TAZO 8 SENSITIVE Sensitive     CEFEPIME 4 SENSITIVE Sensitive     * MODERATE PSEUDOMONAS AERUGINOSA         Radiology Studies: Dg Chest Port 1 View  Result Date: 06/15/2018 CLINICAL DATA:  Hypoxia EXAM: PORTABLE CHEST 1 VIEW COMPARISON:  Five days ago FINDINGS: Interval extubation. Chronic cardiomegaly. Low volumes but improved inflation with now visible left diaphragm. No edema, effusion, or pneumothorax. IMPRESSION: Low volume chest but improved inflation compared to study 5 days ago. Chronic cardiomegaly. Electronically Signed   By: Monte Fantasia M.D.   On: 06/15/2018 09:31      Scheduled Meds: . atorvastatin  20 mg Oral q1800  . celecoxib  200 mg Oral BID  . chlorhexidine gluconate (MEDLINE KIT)  15 mL Mouth Rinse BID  . cholecalciferol  5,000 Units Oral Daily  . DULoxetine  90 mg Oral Daily  . enoxaparin (LOVENOX) injection  0.5 mg/kg Subcutaneous Q24H  . mouth rinse  15 mL Mouth  Rinse q12n4p  . metoprolol tartrate  25 mg Oral BID  .  mirtazapine  15 mg Oral QHS  . multivitamin  1 tablet Oral Daily  . sodium chloride flush  3 mL Intravenous Q12H  . tamsulosin  0.4 mg Oral Daily  . tobramycin  1 drop Right Eye TID   Continuous Infusions: . sodium chloride    . lactated ringers       LOS: 9 days    Time spent in minutes: [place], MD [facility] Pager: [email or web address] Password [facility] 06/16/2018, 12:09 PM

## 2018-06-16 NOTE — Progress Notes (Signed)
Report received from 4N RN kemdra.

## 2018-06-16 NOTE — Progress Notes (Signed)
Placed patient on CPAP for the night via auto-mode with minimum pressure set at 5cm and maximum pressure set at 20cm. Oxygen set at 2lpm.  

## 2018-06-16 NOTE — Progress Notes (Signed)
Pt has been constantly calling since he got on the floor for cup of ice water. Been giving pt pudding thickened water with spoon. Pt still keeps calling, having frequent bigeminy. Will continue to monitor.

## 2018-06-16 NOTE — Progress Notes (Signed)
Pt received from 4N, pt ao x3. Disoriented to time. PT has received fentanyl right before transfer per transferring RN. Pt unable to tolerate flat positron. Honey comb dressing intact to back with some old drainage. Connected to monitor, CCMD notified. Oriented pt to room and call bell system. Bed semi-fowlers . Call bell within reach, will continue to monitor.

## 2018-06-16 NOTE — Progress Notes (Signed)
Patient sat on side of bed for 10 min with min assist once in sitting position and tolerated well. He required mod-max assist to get up and back into laying position.

## 2018-06-17 ENCOUNTER — Other Ambulatory Visit: Payer: Self-pay

## 2018-06-17 ENCOUNTER — Other Ambulatory Visit (HOSPITAL_COMMUNITY): Payer: Medicare HMO

## 2018-06-17 ENCOUNTER — Inpatient Hospital Stay (HOSPITAL_COMMUNITY): Payer: Medicare HMO

## 2018-06-17 DIAGNOSIS — I5031 Acute diastolic (congestive) heart failure: Secondary | ICD-10-CM

## 2018-06-17 DIAGNOSIS — I4891 Unspecified atrial fibrillation: Secondary | ICD-10-CM

## 2018-06-17 DIAGNOSIS — I4819 Other persistent atrial fibrillation: Secondary | ICD-10-CM

## 2018-06-17 DIAGNOSIS — I10 Essential (primary) hypertension: Secondary | ICD-10-CM

## 2018-06-17 LAB — BASIC METABOLIC PANEL
Anion gap: 10 (ref 5–15)
BUN: 33 mg/dL — ABNORMAL HIGH (ref 8–23)
CHLORIDE: 114 mmol/L — AB (ref 98–111)
CO2: 28 mmol/L (ref 22–32)
Calcium: 9.6 mg/dL (ref 8.9–10.3)
Creatinine, Ser: 0.99 mg/dL (ref 0.61–1.24)
GFR calc Af Amer: >60 mL/min (ref >60)
GFR calc non Af Amer: >60 mL/min (ref >60)
Glucose, Bld: 167 mg/dL — ABNORMAL HIGH (ref 70–99)
Potassium: 4.1 mmol/L (ref 3.5–5.1)
Sodium: 152 mmol/L — ABNORMAL HIGH (ref 135–145)

## 2018-06-17 LAB — BRAIN NATRIURETIC PEPTIDE: B Natriuretic Peptide: 120.5 pg/mL — ABNORMAL HIGH (ref 0.0–100.0)

## 2018-06-17 LAB — TSH: TSH: 1.487 u[IU]/mL (ref 0.350–4.500)

## 2018-06-17 LAB — GLUCOSE, CAPILLARY: Glucose-Capillary: 180 mg/dL — ABNORMAL HIGH (ref 70–99)

## 2018-06-17 MED ORDER — LORAZEPAM 2 MG/ML IJ SOLN
0.5000 mg | Freq: Once | INTRAMUSCULAR | Status: AC
  Administered 2018-06-17: 0.5 mg via INTRAVENOUS
  Filled 2018-06-17: qty 1

## 2018-06-17 MED ORDER — PERFLUTREN LIPID MICROSPHERE
1.0000 mL | INTRAVENOUS | Status: AC | PRN
  Administered 2018-06-17: 4 mL via INTRAVENOUS
  Filled 2018-06-17: qty 10

## 2018-06-17 MED ORDER — OXYCODONE HCL 5 MG PO TABS
5.0000 mg | ORAL_TABLET | ORAL | Status: DC | PRN
  Administered 2018-06-17 – 2018-06-19 (×8): 10 mg via ORAL
  Filled 2018-06-17 (×8): qty 2

## 2018-06-17 MED ORDER — DILTIAZEM HCL ER COATED BEADS 120 MG PO CP24
120.0000 mg | ORAL_CAPSULE | Freq: Every day | ORAL | Status: DC
  Filled 2018-06-17: qty 1

## 2018-06-17 MED ORDER — DILTIAZEM HCL ER 60 MG PO CP12
60.0000 mg | ORAL_CAPSULE | Freq: Two times a day (BID) | ORAL | Status: DC
  Filled 2018-06-17: qty 1

## 2018-06-17 MED ORDER — DILTIAZEM HCL 60 MG PO TABS
30.0000 mg | ORAL_TABLET | Freq: Four times a day (QID) | ORAL | Status: DC
  Administered 2018-06-17: 30 mg via ORAL
  Filled 2018-06-17: qty 1

## 2018-06-17 NOTE — Progress Notes (Addendum)
text paged on call [facility] stating pt's HR 120-130's, one time went up to 160's didn't sustain, pt is very shaky. Received order for metoprolol iv. Will continue to monitor.

## 2018-06-17 NOTE — Progress Notes (Signed)
Physical Therapy Treatment Patient Details Name: [name] MRN: [id number] DOB: [birth year] Today's Date: 06/17/2018    History of Present Illness 77 yo male with severe spinal stenosis had T11-T12 decompressive laminectomy 12/20.  Post op developed respiratory failure from pain medications in setting of OSA and required intubation. A fib, RVR; extubated 12/24.Patient status post operative fusion from L1-L5.  OMH: depression; Hepatits; Hepatitis B; HTN; lumbar neuralgia.    PT Comments    Patient seen for therapy progression. Continues to require heavy +2 physical assist for basic bed level and OOB activity. Remains limited by pain, fatigue and confusion. Continue to feel SNF remains appropriate.   Follow Up Recommendations  SNF     Equipment Recommendations  None recommended by PT    Recommendations for Other Services       Precautions / Restrictions Precautions Precautions: Back;Fall Precaution Booklet Issued: No Precaution Comments: educated pt however no adherence Restrictions Weight Bearing Restrictions: No    Mobility  Bed Mobility Overal bed mobility: Needs Assistance Bed Mobility: Rolling;Supine to Sit;Sit to Supine Rolling: +2 for physical assistance;Max assist Sidelying to sit: +2 for safety/equipment;Max assist;HOB elevated     Sit to sidelying: +2 for safety/equipment;Max assist General bed mobility comments: Increased time and effort to bring to EOB, rotation of hips with chuck pad, patient pulling with UE support  Transfers Overall transfer level: Needs assistance Equipment used: None Transfers: Sit to/from Stand Sit to Stand: +2 safety/equipment;Max assist         General transfer comment: performed standing attemps x2 in stedy, limited activity tolerance in stedy. Unsafe for OOB at this time.   Ambulation/Gait             General Gait Details: unable to amb at baseline   Stairs             Wheelchair Mobility    Modified  Rankin (Stroke Patients Only)       Balance Overall balance assessment: Needs assistance Sitting-balance support: Feet supported Sitting balance-Leahy Scale: Poor Sitting balance - Comments: min to moderate assist at EOB with reliance on UE support and posterior assist Postural control: Posterior lean Standing balance support: Bilateral upper extremity supported Standing balance-Leahy Scale: Zero Standing balance comment: Patient with heavy reliance on UE support                             Cognition Arousal/Alertness: Awake/alert Behavior During Therapy: Anxious;Impulsive Overall Cognitive Status: Impaired/Different from baseline Area of Impairment: Following commands;Safety/judgement;Problem solving                       Following Commands: Follows one step commands consistently;Follows one step commands with increased time Safety/Judgement: Decreased awareness of deficits;Decreased awareness of safety   Problem Solving: Slow processing;Requires tactile cues;Requires verbal cues General Comments: Patient focused and perseverating on wanting a pepsi and wanting to go home      Exercises      General Comments General comments (skin integrity, edema, etc.): VSS- NP in room and noted drainage from the wound at this time.  pt very sweaty on arrival and gown changes to decrease wet linen on patient      Pertinent Vitals/Pain Pain Assessment: Faces Faces Pain Scale: Hurts even more Pain Location: back Pain Descriptors / Indicators: Grimacing;Guarding;Moaning Pain Intervention(s): Monitored during session;Repositioned    Home Living  Prior Function            PT Goals (current goals can now be found in the care plan section) Acute Rehab PT Goals Patient Stated Goal: "get me out of here" PT Goal Formulation: With patient Time For Goal Achievement: 06/25/18 Potential to Achieve Goals: Fair Progress towards PT goals: Not  progressing toward goals - comment(limited by pain and fatigue)    Frequency    Min 4X/week      PT Plan Current plan remains appropriate    Co-evaluation PT/OT/SLP Co-Evaluation/Treatment: Yes Reason for Co-Treatment: Complexity of the patient's impairments (multi-system involvement);Necessary to address cognition/behavior during functional activity;For patient/therapist safety;To address functional/ADL transfers PT goals addressed during session: Mobility/safety with mobility OT goals addressed during session: ADL's and self-care;Proper use of Adaptive equipment and DME;Strengthening/ROM      AM-PAC PT "6 Clicks" Mobility   Outcome Measure  Help needed turning from your back to your side while in a flat bed without using bedrails?: Total Help needed moving from lying on your back to sitting on the side of a flat bed without using bedrails?: Total Help needed moving to and from a bed to a chair (including a wheelchair)?: Total Help needed standing up from a chair using your arms (e.g., wheelchair or bedside chair)?: Total Help needed to walk in hospital room?: Total Help needed climbing 3-5 steps with a railing? : Total 6 Click Score: 6    End of Session Equipment Utilized During Treatment: Other (comment)(Sara Plus) Activity Tolerance: Patient limited by pain;Patient limited by fatigue Patient left: in bed;with call bell/phone within reach Nurse Communication: Mobility status;Need for lift equipment PT Visit Diagnosis: Unsteadiness on feet (R26.81);Difficulty in walking, not elsewhere classified (R26.2) Pain - Right/Left: Right Pain - part of body: Leg(and back at incision)     Time: [id number] PT Time Calculation (min) (ACUTE ONLY): 27 min  Charges:  $Therapeutic Activity: 8-22 mins                     Charlotte Crumbevon Jamario Colina, PT DPT  Board Certified Neurologic Specialist Acute Rehabilitation Services Pager [phone] Office [phone]    [name] 06/17/2018,  3:48 PM

## 2018-06-17 NOTE — Progress Notes (Addendum)
Progress Note  Patient Name: [name] Date of Encounter: 06/17/2018  Primary Cardiologist: No primary care provider on file.   Subjective   Denies any chest pain or shortness of breath but  is very agitated.  Remains in atrial fibrillation with occasional PVCs heart rate in low 100s  Inpatient Medications    Scheduled Meds: . atorvastatin  20 mg Oral q1800  . celecoxib  200 mg Oral BID  . chlorhexidine gluconate (MEDLINE KIT)  15 mL Mouth Rinse BID  . cholecalciferol  5,000 Units Oral Daily  . DULoxetine  90 mg Oral Daily  . enoxaparin (LOVENOX) injection  0.5 mg/kg Subcutaneous Q24H  . mouth rinse  15 mL Mouth Rinse q12n4p  . metoprolol tartrate  50 mg Oral BID  . mirtazapine  15 mg Oral QHS  . multivitamin  1 tablet Oral Daily  . sodium chloride flush  3 mL Intravenous Q12H  . tamsulosin  0.4 mg Oral Daily  . tobramycin  1 drop Right Eye TID   Continuous Infusions: . sodium chloride    . lactated ringers     PRN Meds: acetaminophen, bisacodyl, cyclobenzaprine, fentaNYL (SUBLIMAZE) injection, lactated ringers, metoprolol tartrate, ondansetron **OR** ondansetron (ZOFRAN) IV, oxyCODONE, RESOURCE THICKENUP CLEAR, sodium chloride flush   Vital Signs    Vitals:   06/16/18 2334 06/17/18 0500 06/17/18 0539 06/17/18 0819  BP: (!) 195/167  (!) 139/100 114/89  Pulse:   (!) 107 (!) 115  Resp: 17  18   Temp:   98.2 F (36.8 C) 98 F (36.7 C)  TempSrc:   Axillary Oral  SpO2:   97% 98%  Weight:  (!) 139.1 kg    Height:        Intake/Output Summary (Last 24 hours) at 06/17/2018 0932 Last data filed at 06/17/2018 0300 Gross per 24 hour  Intake 710 ml  Output 927 ml  Net -217 ml   Filed Weights   06/15/18 0425 06/16/18 0000 06/17/18 0500  Weight: (!) 141.8 kg (!) 141.2 kg (!) 139.1 kg    Telemetry    Atrial fibrillation with occasional PVCs- Personally Reviewed  ECG    No new EKG to review- Personally Reviewed  Physical Exam   GEN:  Agitated this  morning Neck: No JVD Cardiac:  Irregularly irregular, no murmurs, rubs, or gallops.  Respiratory: Clear to auscultation bilaterally. GI: Soft, nontender, non-distended  MS: No edema; No deformity. Neuro:  Nonfocal  Psych: Normal affect   Labs    Chemistry Recent Labs  Lab 06/15/18 2203 06/16/18 0416 06/17/18 0311  NA 150* 148* 152*  K 3.6 4.0 4.1  CL 110 109 114*  CO2 _0 GLUCOSE 191* 199* 167*  BUN 47* 43* 33*  CREATININE 1.16 1.03 0.99  CALCIUM 9.6 9.5 9.6  GFRNONAA >60 >60 >60  GFRAA >60 >60 >60  ANIONGAP _1 Hematology Recent Labs  Lab 06/14/18 0313 06/14/18 1829 06/15/18 0453  WBC 12.0* 12.6* 11.3*  RBC 4.90 4.82 4.69  HGB 14.2 14.3 13.6  HCT 46.3 45.3 44.5  MCV 94.5 94.0 94.9  MCH 29.0 29.7 29.0  MCHC 30.7 31.6 30.6  RDW 14.1 14.1 14.3  PLT 274 313 257    Cardiac EnzymesNo results for input(s): TROPONINI in the last 168 hours. No results for input(s): TROPIPOC in the last 168 hours.   BNP Recent Labs  Lab 06/17/18 0311  BNP 120.5*     DDimer No results for input(s):  DDIMER in the last 168 hours.   Radiology    No results found.  Cardiac Studies   none  Patient Profile     77 y.o. male  with a history of degenerative disc dzs s/p multiple spinal surgeries, PAF, HTN, chronic pain, thoracic Ao aneurysm, anxiety, depression, and OSA, who is being seen for the evaluation of rapid afib   Assessment & Plan    1.  PAF -He has had PAF in the past associated with nephrolithiasis and acute pain in 2017 and now after T11-12 laminectomy complicated by acute respiratory failure and pain -Remains in atrial fibrillation with heart rate in the low 100s this morning but he is somewhat agitated as well -He had been on Eliquis in the past but stopped due to concern over frequent falls.  CHADS2VASC score of 5. -He is going to SNF following d/c and therefore Eliquis 32m BID has been recommended once cleared by neurosurgery -Will need to be  on Eliquis for at least 3 weeks uninterrupted prior to planning cardioversion -Continue Lopressor 50 mg twice daily -Add Cardizem CD 120 mg daily for better heart rate control  2.  Acute Respiratory Failure -resolved  3.  Chronic back pain/DDD  -s/p T11-12 laminectomy -per neurosurgery/TRH  4.  HTN -BP controlled  -continue Lopressor 50 mg twice daily  5.  HFpEF - He put out 927cc yesterday and is net neg 9.7L -weight is down 15lbs since admit -He is currently not on diuretic therapy but had been on IV Lasix up until a few days ago -Creatinine stable at 0.99 -BNP minimally elevated at 120 today although this may be falsely low due to underlying obesity -Lungs are clear on exam today and he has no edema  6.  Elevated troponin -Only minimally elevated at 0.04 and 0.03 on 12/21. -Is a flat trend consistent with demand ischemia in the setting of acute respiratory failure and rapid A. Fib -[facility] -No plan for repeat ischemic evaluation at this time unless there has been a change in LV function on echo that is now pending  7.  Frequent PVCs/bigeminy -Keep potassium and magnesium repleted -2D echo pending  I have spent a total of 35 minutes with patient reviewing telemetry, EKGs, labs and examining patient as well as establishing an assessment and plan that was discussed with the patient.  > 50% of time was spent in direct patient care.       For questions or updates, please contact CRamblewoodPlease consult [email or web address] for contact info under Cardiology/STEMI.      Signed, TFransico Him MD  06/17/2018, 9:32 AM

## 2018-06-17 NOTE — Progress Notes (Signed)
Occupational Therapy Treatment Patient Details Name: [name] MRN: [id number] DOB: March 15, 1941 Today's Date: 06/17/2018    History of present illness 77 yo male with severe spinal stenosis had T11-T12 decompressive laminectomy 12/20.  Post op developed respiratory failure from pain medications in setting of OSA and required intubation. A fib, RVR; extubated 12/24.Patient status post operative fusion from L1-L5.  OMH: depression; Hepatits; Hepatitis B; HTN; lumbar neuralgia.   OT comments  Pt completed OOB to stedy standing and return to supine this session. Pt limited by pain and fatigue. Pt only requesting a Pepsi to drink . Pt continues to progress very slowly with high fall risk.    Follow Up Recommendations  SNF;Supervision/Assistance - 24 hour    Equipment Recommendations  3 in 1 bedside commode    Recommendations for Other Services      Precautions / Restrictions Precautions Precautions: Back;Fall Precaution Booklet Issued: No Precaution Comments: educated pt however no adherence Restrictions Weight Bearing Restrictions: No       Mobility Bed Mobility Overal bed mobility: Needs Assistance Bed Mobility: Rolling;Supine to Sit;Sit to Supine Rolling: +2 for physical assistance;Max assist Sidelying to sit: +2 for safety/equipment;Max assist;HOB elevated     Sit to sidelying: +2 for safety/equipment;Max assist General bed mobility comments: Increased time and effort to bring to EOB, rotation of hips with chuck pad, patient pulling with UE support  Transfers Overall transfer level: Needs assistance Equipment used: None Transfers: Sit to/from Stand Sit to Stand: +2 safety/equipment;Max assist         General transfer comment: performed standing attemps x2 in stedy, limited activity tolerance in stedy. Unsafe for OOB at this time.     Balance Overall balance assessment: Needs assistance Sitting-balance support: Feet supported Sitting balance-Leahy Scale:  Poor Sitting balance - Comments: min to moderate assist at EOB with reliance on UE support and posterior assist Postural control: Posterior lean Standing balance support: Bilateral upper extremity supported Standing balance-Leahy Scale: Zero Standing balance comment: Patient with heavy reliance on UE support                            ADL either performed or assessed with clinical judgement   ADL Overall ADL's : Needs assistance/impaired         Upper Body Bathing: Maximal assistance   Lower Body Bathing: Total assistance                         General ADL Comments: pt complete sit <>stand in sara stedy at the EOB. pt unable to sustain standing or sitting in the stedy.pt requesting to return to bed     Vision       Perception     Praxis      Cognition Arousal/Alertness: Awake/alert Behavior During Therapy: Anxious;Impulsive Overall Cognitive Status: Impaired/Different from baseline Area of Impairment: Following commands;Safety/judgement;Problem solving                       Following Commands: Follows one step commands consistently;Follows one step commands with increased time Safety/Judgement: Decreased awareness of deficits;Decreased awareness of safety   Problem Solving: Slow processing;Requires tactile cues;Requires verbal cues General Comments: Patient focused and perseverating on wanting a pepsi and wanting to go home        Exercises     Shoulder Instructions       General Comments VSS- NP in room and noted drainage  from the wound at this time.  pt very sweaty on arrival and gown changes to decrease wet linen on patient    Pertinent Vitals/ Pain       Pain Assessment: Faces Faces Pain Scale: Hurts even more Pain Location: back Pain Descriptors / Indicators: Grimacing;Guarding;Moaning Pain Intervention(s): Monitored during session;Repositioned  Home Living                                           Prior Functioning/Environment              Frequency  Min 2X/week        Progress Toward Goals  OT Goals(current goals can now be found in the care plan section)  Progress towards OT goals: Progressing toward goals  Acute Rehab OT Goals Patient Stated Goal: "get me out of here" OT Goal Formulation: Patient unable to participate in goal setting Time For Goal Achievement: 06/25/18 Potential to Achieve Goals: Good ADL Goals Pt Will Perform Eating: with set-up;sitting Pt Will Perform Grooming: with min assist;sitting Pt Will Perform Upper Body Bathing: with min assist;sitting Pt Will Transfer to Toilet: with mod assist;bedside commode;stand pivot transfer  Plan Discharge plan remains appropriate    Co-evaluation    PT/OT/SLP Co-Evaluation/Treatment: Yes Reason for Co-Treatment: Complexity of the patient's impairments (multi-system involvement);Necessary to address cognition/behavior during functional activity;For patient/therapist safety;To address functional/ADL transfers PT goals addressed during session: Mobility/safety with mobility OT goals addressed during session: ADL's and self-care;Proper use of Adaptive equipment and DME;Strengthening/ROM      AM-PAC OT "6 Clicks" Daily Activity     Outcome Measure   Help from another person eating meals?: A Lot Help from another person taking care of personal grooming?: A Lot Help from another person toileting, which includes using toliet, bedpan, or urinal?: Total Help from another person bathing (including washing, rinsing, drying)?: A Lot Help from another person to put on and taking off regular upper body clothing?: Total Help from another person to put on and taking off regular lower body clothing?: Total 6 Click Score: 9    End of Session Equipment Utilized During Treatment: Oxygen  OT Visit Diagnosis: Unsteadiness on feet (R26.81);Other abnormalities of gait and mobility (R26.89);Muscle weakness (generalized)  (M62.81);Pain;Other symptoms and signs involving cognitive function   Activity Tolerance Patient limited by fatigue   Patient Left in bed;with call bell/phone within reach;with bed alarm set   Nurse Communication Mobility status;Precautions        Time: [id number] OT Time Calculation (min): 27 min  Charges: OT General Charges $OT Visit: 1 Visit OT Treatments $Therapeutic Activity: 8-22 mins   [name], OTR/L  Acute Rehabilitation Services Pager: [phone] Office: [phone] .    [name] 06/17/2018, 3:16 PM

## 2018-06-17 NOTE — Progress Notes (Addendum)
1214: on call [facility] text paged with pt's current situation, notified Neuro surgery hasnt called back yet. Requested to give me a call.  1220: on call triad called back, notified pt has been profusely sweating, flushed, very shaky, jerking his arms and leg,anxious, hypertensive and tachycardiac.HR better than before with PRN iv and schedule metoprolol. Unable to get accurate BP reading, mannual/automatic. Last bp was 195/167. Notified MD neuro MD hasnt returned page back. Requested [facility] MD to come see the patient, and notified that rapid said it looks like he is withdrawing from something. Also notified MD what pt takes at home for pain. She stated she will put some order for ativan and will go from there. Will continue to monitor.

## 2018-06-17 NOTE — Progress Notes (Signed)
   Providing Compassionate, Quality Care - Together   Subjective: Patient is working with PT and OT upon assessment. He is slightly confused. He is asking for a Pepsi. He states he is ready to go home. Nursing reports incident overnight of tachycardia, sweating, and shaking. He is diaphoretic and still with some jerking arm movements this morning. He is tachycardic.  Objective: Vital signs in last 24 hours: Temp:  [97.6 F (36.4 C)-98.3 F (36.8 C)] 98 F (36.7 C) (12/30 0819) Pulse Rate:  [107-121] 115 (12/30 0819) Resp:  [17-31] 18 (12/30 0539) BP: (114-195)/(87-167) 114/89 (12/30 0819) SpO2:  [91 %-98 %] 98 % (12/30 0819) Weight:  [139.1 kg] 139.1 kg (12/30 0500)  Intake/Output from previous day: 12/29 0701 - 12/30 0700 In: 830 [P.O.:830] Out: 927 [Urine:925; Stool:2] Intake/Output this shift: No intake/output data recorded.  Diaphoretic Alert and oriented x 3 with mild, intermittent confusion MAE, BUE 5/5, BLE 4/5 PERRLA Incision and steri strips with old drainage and saturate with sweat (removed during assessment)  Lab Results: Recent Labs    06/14/18 1829 06/15/18 0453  WBC 12.6* 11.3*  HGB 14.3 13.6  HCT 45.3 44.5  PLT 313 257   BMET Recent Labs    06/16/18 0416 06/17/18 0311  NA 148* 152*  K 4.0 4.1  CL 109 114*  CO2 27 28  GLUCOSE 199* 167*  BUN 43* 33*  CREATININE 1.03 0.99  CALCIUM 9.5 9.6    Studies/Results: No results found.  Assessment/Plan: [name] is 10 days s/p T11-T12 decompressive laminectomy. He failed extubation post-operatively and required reintubation. He was extubated 12/24. He uses a CPAP at night and nasal cannula intermittently. Concern for withdrawal was mentioned in overnight nursing note, but as patient has been admitted for 10 days and receiving opioids for pain management, this is is not likely. Condition did not change when Ativan was administered overnight.   LOS: 10 days    -Continue working with therapies,  recommending SNF at discharge -Continue diet recommendations per SLP -Cardiology following and managing PAF -Appreciate hospitalists assistance with medical management -Transitioning patient to PO pain medications as fentanyl is being used instead of oxycodone. Encouraged nursing staff to use cyclobenzaprine -Change dressing BID and prn   Val EagleMeghan Bergman, DNP, AGNP-C Nurse Practitioner  [facility] [address], [address], [place], [place] [address] P: [phone]    F: [phone]  06/17/2018, 12:18 PM

## 2018-06-17 NOTE — Progress Notes (Signed)
  Speech Language Pathology Treatment: Dysphagia  Patient Details Name: [name] MRN: [id number] DOB: [birth year] Today's Date: 06/17/2018 Time: [id number] SLP Time Calculation (min) (ACUTE ONLY): 16 min  Assessment / Plan / Recommendation Clinical Impression  Pt demonstrates subjective improvement in swallow function, no signs of aspiration seen with increased bolus size and slightly decreased viscosity (nectar/honey). Improvement warrants repeat MBS tomorrow given pts delirium and constant requests for liquids.   HPI HPI: Pt is a 77 yo male with severe spinal stenosis s/p T11-T12 decompressive laminectomy 12/20.  He was extubated post-op, but then developed respiratory failure from pain medications in setting of OSA and required re-intubation 12/20-12/24. PMH: OSA, A fib, Thoracic aortic aneurysm, Diastolic CHF, Arthritis, depression, Hep B      SLP Plan  Continue with current plan of care       Recommendations  Diet recommendations: Pudding-thick liquid;Dysphagia 1 (puree);Honey-thick liquid Liquids provided via: Teaspoon Medication Administration: Crushed with puree Supervision: Patient able to self feed Compensations: Slow rate;Small sips/bites                Oral Care Recommendations: Oral care QID Follow up Recommendations: None SLP Visit Diagnosis: Dysphagia, oropharyngeal phase (R13.12) Plan: Continue with current plan of care       GO               Travis DittyBonnie Gergory Biello, MA CCC-SLP  Acute Rehabilitation Services Pager [phone] Office [phone]  [name], [name] 06/17/2018, 3:49 PM

## 2018-06-17 NOTE — Progress Notes (Signed)
Pt was profusely sweating wetting his clothing, very anxious, tacycardic, very shaky jerking his arms and legs occasionally. Hypertensive, unable to get accurate BP due to pt shaking.  2320: Rapid response paged.  2325Onalee Hua: David RN from rapid response by bedside. Suggested pt looks like he is withdrawing from something, suggested to inform neuro surgery.  2338:Text  paged oncall Neuro surgery Meyran NP with pt's current situation. Waiting on call back.

## 2018-06-17 NOTE — Progress Notes (Signed)
  Echocardiogram 2D Echocardiogram has been performed.   Attempted paging Dr. Mayford Knifeurner for Stat echo read with no reply.  [name], [name] 06/17/2018, 6:26 PM

## 2018-06-17 NOTE — Care Management Important Message (Signed)
Important Message  Patient Details  Name: [name] MRN: [id number] Date of Birth: 14-Apr-1941   Medicare Important Message Given:  Yes    [name] 06/17/2018, 3:26 PM

## 2018-06-17 NOTE — Progress Notes (Signed)
Patient CPAP refused for the night

## 2018-06-17 NOTE — Progress Notes (Addendum)
PROGRESS NOTE    [name]   KZL:[id number]  DOB: 20-Jul-1940  DOA: 06/07/2018 PCP: Nicola Girt, DO   Brief Narrative:  [name] is a 77 y.o. male with medical history significant of history of thoracic aortic aneurysm, hyperlipidemia, depression, hypertension, BPH, heart failure who underwent T11-T12 decompressive laminectomy due to severe spinal stenosis with Dr. Annette Stable on 12/20. Extubated in PACU, but upon extubation, he went into atrial fibrillation with RVR heart rate in the 140s.  Also with apneic spell due to postop anesthesia and underlying OSA.  PCCM was consulted.  He was intubated and eventually extubated on 12/24. He was transferred out of ICU and to [facility] service (to act as consultants) on 12/27. He became hypotensive and tachycardic on 12/27 afternoon and was given fluid boluses. Antiarrythmic agents and antihypertensives were held I have done an extensive review of the chart including outpatient records.   Subjective: Asking repeatedly for water (still on pudding thick liquids). Has no other complaints.   Assessment & Plan:  NOTE: has been restless/ agitated at night- agree with Neurosurgery that he is not withdrawing from anything as mentioned in multiple nursing notes.  Would like to avoid sedatives to prevent respiratory compromise. If his is not using a CPAP, should keep on O2 if he his pulse ox is < 88 to keep pulse ox 88-92%.   HR better controlled now and he is not fluid overloaded. Further plans per cardiology. I have nothing further to add medically and thus will sign off. Please call if there are any questions.    Principal Problem:   Acute respiratory failure with hypoxia and hypercapnia  - suspected to be in relation to OSA in setting of pain medications- required intubation as mentioned above - pseudomonas + in sputum but no infiltrates or signs of infection (he had one temp spike but it did not recurr) - holding off antibiotics - no  longer requiring any O2 during the day- can continue CPAP or O2 at night if he is becoming hypoxic in relation to his sleep apnea  Active Problems:   Parosyxmal A-fib   - not on any rate controlling agent at baseline and only on aspirin for secondary stroke prophylaxis - follows with [facility]ology at [facility] - cannot put on full dose anticoagulation at this time as he is post op laminectomy (unless OK with NS) - HR currently in still 120-130- oral Cardizem started but not effective - switched to Lopressor - asked for a cardiology eval - appreciate managment  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  Normal left ventricular systolic function  Ejection fraction is visually estimated at 39-03%  Diastolic function appears indeterminate.  Mild concentric left ventricular hypertrophy  Mild Dilation of the aortic root (4.26 cm)  Moderate to severe dilation of the Ascending Aorta (4.97 cm)  The IVC is not well visualized.  - repeat ECHO report is pending  Essential HTN - cont Losartan- on 20 mg Lasix daily at home for right heart failure per IM note- no signs of fluid overload at this time- received a number of doses of IV Lasix early on in hospital stay    Morbid obesity     OSA (obstructive sleep apnea) - Body mass index is 47.33 kg/m.  - CPAP ordered- as mentioned, can use CPAP or O2 via  a  night if he is becoming hypoxic- he is in process of getting a CPAP as outpt  Acute hypotensive episode on 56/31 - I am uncertain of the cause as I was not the attending at the time- ? Due to diuresis- improved after IVF given  Mild AKI, hypernatremia - ? Due to diuresis- given fluids on 12/27- - follow   Hypokalemia - replaced- Mg normal    Spinal stenosis, thoracic - per primary team- s/p T11-T12 decompressive laminectomy- very weak    BPH (benign prostatic  hyperplasia) - cont Flomax-   Foley d/c on 12/28  Thoracic Aortic Aneurysm -- followed up with CT surgery on 05/29/18 for surveillance   DVT prophylaxis: Lovenex Code Status: Full code Family Communication:  Disposition Plan: per primary team -   Antimicrobials:  Anti-infectives (From admission, onward)   Start     Dose/Rate Route Frequency Ordered Stop   06/07/18 2300  ceFAZolin (ANCEF) IVPB 1 g/50 mL premix     1 g 100 mL/hr over 30 Minutes Intravenous Every 8 hours 06/07/18 1957 06/08/18 1900   06/07/18 1513  vancomycin (VANCOCIN) powder  Status:  Discontinued       As needed 06/07/18 1513 06/07/18 1733   06/07/18 1512  bacitracin 50,000 Units in sodium chloride 0.9 % 500 mL irrigation  Status:  Discontinued       As needed 06/07/18 1512 06/07/18 1733   06/07/18 1400  ceFAZolin (ANCEF) 3 g in dextrose 5 % 50 mL IVPB     3 g 100 mL/hr over 30 Minutes Intravenous To [facility]Stay [facility] 06/06/18 1135 06/07/18 2030       Objective: Vitals:   06/17/18 0500 06/17/18 0539 06/17/18 0819 06/17/18 1229  BP:  (!) 139/100 114/89 123/80  Pulse:  (!) 107 (!) 115 (!) 102  Resp:  18  (!) 27  Temp:  98.2 F (36.8 C) 98 F (36.7 C) 98.8 F (37.1 C)  TempSrc:  Axillary Oral Oral  SpO2:  97% 98% 98%  Weight: (!) 139.1 kg     Height:        Intake/Output Summary (Last 24 hours) at 06/17/2018 1247 Last data filed at 06/17/2018 0300 Gross per 24 hour  Intake 710 ml  Output 927 ml  Net -217 ml   Filed Weights   06/15/18 0425 06/16/18 0000 06/17/18 0500  Weight: (!) 141.8 kg (!) 141.2 kg (!) 139.1 kg    Examination: General exam: Appears comfortable - appears very weak HEENT: PERRLA, oral mucosa moist, no sclera icterus or thrush Respiratory system: Clear to auscultation. Respiratory effort normal. Cardiovascular system: S1 & S2 heard, IIRR, tachycardic.   Gastrointestinal system: Abdomen soft, non-tender, nondistended. Normal bowel sounds. Central nervous system: Alert and  oriented. No focal neurological deficits. Extremities: No cyanosis, clubbing or edema Skin: No rashes or ulcers Psychiatry:  Mood & affect appropriate.     Data Reviewed: I have personally reviewed following labs and imaging studies  CBC: Recent Labs  Lab 06/11/18 0154 06/13/18 0331 06/14/18 0313 06/14/18 1829 06/15/18 0453  WBC 9.5 10.0 12.0* 12.6* 11.3*  HGB 11.1* 12.6* 14.2 14.3 13.6  HCT 35.9* 42.0 46.3 45.3 44.5  MCV 95.0 95.7 94.5 94.0 94.9  PLT 137* 231 274 313 497   Basic Metabolic Panel: Recent Labs  Lab 06/11/18 0154  06/14/18 0306 06/14/18 1829 06/15/18 0453 06/15/18 2203 06/16/18 0416 06/17/18 0311  NA 140   < > 147* 146* 147* 150* 148* 152*  K 3.8   < >  3.1* 3.2* 3.0* 3.6 4.0 4.1  CL 97*   < > 100 103 103 110 109 114*  CO2 31   < > 32 '29 28 28 27 28  ' GLUCOSE 149*   < > 170* 182* 210* 191* 199* 167*  BUN 32*   < > 48* 54* 58* 47* 43* 33*  CREATININE 0.92   < > 1.14 1.74* 1.42* 1.16 1.03 0.99  CALCIUM 8.6*   < > 10.0 9.7 9.4 9.6 9.5 9.6  MG 2.0  --  2.3 2.2 2.3 2.2  --   --    < > = values in this interval not displayed.   GFR: Estimated Creatinine Clearance: 85.5 mL/min (by C-G formula based on SCr of 0.99 mg/dL). Liver Function Tests: No results for input(s): AST, ALT, ALKPHOS, BILITOT, PROT, ALBUMIN in the last 168 hours. No results for input(s): LIPASE, AMYLASE in the last 168 hours. No results for input(s): AMMONIA in the last 168 hours. Coagulation Profile: No results for input(s): INR, PROTIME in the last 168 hours. Cardiac Enzymes: No results for input(s): CKTOTAL, CKMB, CKMBINDEX, TROPONINI in the last 168 hours. BNP (last 3 results) No results for input(s): PROBNP in the last 8760 hours. HbA1C: No results for input(s): HGBA1C in the last 72 hours. CBG: Recent Labs  Lab 06/14/18 1950 06/14/18 2313 06/15/18 0430 06/15/18 0838 06/17/18 0026  GLUCAP 165* 140* 189* 244* 180*   Lipid Profile: No results for input(s): CHOL, HDL,  LDLCALC, TRIG, CHOLHDL, LDLDIRECT in the last 72 hours. Thyroid Function Tests: Recent Labs    06/17/18 0311  TSH 1.487   Anemia Panel: No results for input(s): VITAMINB12, FOLATE, FERRITIN, TIBC, IRON, RETICCTPCT in the last 72 hours. Urine analysis:    Component Value Date/Time   COLORURINE YELLOW 06/07/2018 2154   APPEARANCEUR CLEAR 06/07/2018 2154   LABSPEC 1.025 06/07/2018 2154   PHURINE 5.5 06/07/2018 2154   GLUCOSEU 100 (A) 06/07/2018 2154   HGBUR LARGE (A) 06/07/2018 2154   BILIRUBINUR NEGATIVE 06/07/2018 2154   KETONESUR NEGATIVE 06/07/2018 2154   PROTEINUR NEGATIVE 06/07/2018 2154   UROBILINOGEN 1.0 07/30/2010 0413   NITRITE NEGATIVE 06/07/2018 2154   LEUKOCYTESUR NEGATIVE 06/07/2018 2154   Sepsis Labs: '@LABRCNTIP' (procalcitonin:4,lacticidven:4) ) Recent Results (from the past 240 hour(s))  Culture, respiratory (non-expectorated)     Status: None   Collection Time: 06/10/18  9:09 PM  Result Value Ref Range Status   Specimen Description TRACHEAL ASPIRATE  Final   Special Requests NONE  Final   Gram Stain   Final    RARE WBC PRESENT, PREDOMINANTLY PMN RARE GRAM NEGATIVE RODS Performed at [facility], [place] [address], [place], [place] [address]    Culture MODERATE PSEUDOMONAS AERUGINOSA  Final   Report Status 06/13/2018 FINAL  Final   Organism ID, Bacteria PSEUDOMONAS AERUGINOSA  Final      Susceptibility   Pseudomonas aeruginosa - MIC*    CEFTAZIDIME 4 SENSITIVE Sensitive     CIPROFLOXACIN <=0.25 SENSITIVE Sensitive     GENTAMICIN <=1 SENSITIVE Sensitive     IMIPENEM 2 SENSITIVE Sensitive     PIP/TAZO 8 SENSITIVE Sensitive     CEFEPIME 4 SENSITIVE Sensitive     * MODERATE PSEUDOMONAS AERUGINOSA         Radiology Studies: No results found.    Scheduled Meds: . atorvastatin  20 mg Oral q1800  . celecoxib  200 mg Oral BID  . chlorhexidine gluconate (MEDLINE KIT)  15 mL Mouth Rinse BID  .  cholecalciferol  5,000 Units Oral Daily  . diltiazem   120 mg Oral Daily  . DULoxetine  90 mg Oral Daily  . enoxaparin (LOVENOX) injection  0.5 mg/kg Subcutaneous Q24H  . mouth rinse  15 mL Mouth Rinse q12n4p  . metoprolol tartrate  50 mg Oral BID  . mirtazapine  15 mg Oral QHS  . multivitamin  1 tablet Oral Daily  . sodium chloride flush  3 mL Intravenous Q12H  . tamsulosin  0.4 mg Oral Daily  . tobramycin  1 drop Right Eye TID   Continuous Infusions: . sodium chloride    . lactated ringers       LOS: 10 days    Time spent in minutes: [place], MD [facility] Pager: [email or web address] Password [facility] 06/17/2018, 12:47 PM

## 2018-06-17 NOTE — Progress Notes (Signed)
Pt had pulled out his cpap mask off, o2 sat was in 80's, pt has been fighting cpap machine snice he got it on. 3l o2 applied via , o2 sat 96%.seems calmer without cpap. One time dose of 0.5 mg iv ativan administered. Pt is still sweating profusely, sleeping, still shaky and jerking his arms and legs occasionally, but seems calmer than earlier. On call [facility] x [facility] text paged back with pt's CBG being 180. Will continue to monitor.

## 2018-06-18 ENCOUNTER — Inpatient Hospital Stay (HOSPITAL_COMMUNITY): Payer: Medicare HMO

## 2018-06-18 ENCOUNTER — Other Ambulatory Visit (HOSPITAL_COMMUNITY): Payer: Medicare HMO

## 2018-06-18 DIAGNOSIS — R7989 Other specified abnormal findings of blood chemistry: Secondary | ICD-10-CM

## 2018-06-18 LAB — BASIC METABOLIC PANEL
Anion gap: 11 (ref 5–15)
BUN: 34 mg/dL — ABNORMAL HIGH (ref 8–23)
CALCIUM: 9.5 mg/dL (ref 8.9–10.3)
CO2: 29 mmol/L (ref 22–32)
CREATININE: 1.17 mg/dL (ref 0.61–1.24)
Chloride: 115 mmol/L — ABNORMAL HIGH (ref 98–111)
GFR calc Af Amer: >60 mL/min (ref >60)
GFR calc non Af Amer: 60 mL/min — ABNORMAL LOW (ref >60)
Glucose, Bld: 155 mg/dL — ABNORMAL HIGH (ref 70–99)
Potassium: 3.8 mmol/L (ref 3.5–5.1)
Sodium: 155 mmol/L — ABNORMAL HIGH (ref 135–145)

## 2018-06-18 LAB — ECHOCARDIOGRAM COMPLETE
Height: 68 in
Weight: 4906.56 oz

## 2018-06-18 MED ORDER — DILTIAZEM HCL ER COATED BEADS 180 MG PO CP24: 180.0000 mg | ORAL_CAPSULE | Freq: Every day | ORAL | Status: DC

## 2018-06-18 MED ORDER — DILTIAZEM HCL 60 MG PO TABS
60.0000 mg | ORAL_TABLET | Freq: Four times a day (QID) | ORAL | Status: DC
  Administered 2018-06-18 – 2018-06-28 (×41): 60 mg via ORAL
  Filled 2018-06-18 (×37): qty 1

## 2018-06-18 MED ORDER — SODIUM CHLORIDE 0.9% FLUSH: 10.0000 mL | INTRAVENOUS | Status: DC | PRN

## 2018-06-18 MED ORDER — SODIUM CHLORIDE 0.9% FLUSH
10.0000 mL | Freq: Two times a day (BID) | INTRAVENOUS | Status: DC
  Administered 2018-06-18 – 2018-06-28 (×18): 10 mL

## 2018-06-18 NOTE — Progress Notes (Signed)
CSW left a SNF list in the patients room for the son to look over. Please check in with him to see if he has made a decision.   CSW also left a SNF list in the patients shadow chart.   CSW will continue to follow.   Drucilla Schmidtaitlin Jade Burright, MSW, LCSW-A Clinical Social Worker [facility]

## 2018-06-18 NOTE — Plan of Care (Signed)
  Problem: Clinical Measurements: Goal: Ability to maintain clinical measurements within normal limits will improve Outcome: Not Progressing   Problem: Activity: Goal: Ability to tolerate increased activity will improve Outcome: Not Progressing   

## 2018-06-18 NOTE — Progress Notes (Signed)
Physical Therapy Treatment Patient Details Name: [name] MRN: [id number] DOB: [birth year] Today's Date: 06/18/2018    History of Present Illness 77 yo male with severe spinal stenosis had T11-T12 decompressive laminectomy 12/20.  Post op developed respiratory failure from pain medications in setting of OSA and required intubation. A fib, RVR; extubated 12/24.Patient status post operative fusion from L1-L5.  OMH: depression; Hepatits; Hepatitis B; HTN; lumbar neuralgia.    PT Comments    Patient seen this am for therapy progression. Assisted patient to EOB with increased time, less physical assist to reach upright at EOB. Attempted standing trials but patient unable to withstand body weight to reach upright position despite +2 physical assist. Patient with some confusion but able to indicate need for use of commode, unable to safely utilize commode, assisted back to bed. Hygiene and pericare performed. Cuurent POC remains appropriate.  Addendum: spoke with patient's son [name] via phone regarding patients inability to mobilize safely, and requirements for physical assist at this time. Confirmed current recommendations remain for SNF, [name] was receptive and appreciative of information.    Follow Up Recommendations  SNF     Equipment Recommendations  None recommended by PT    Recommendations for Other Services       Precautions / Restrictions Precautions Precautions: Back;Fall Precaution Booklet Issued: No Precaution Comments: educated pt however no adherence Restrictions Weight Bearing Restrictions: No    Mobility  Bed Mobility Overal bed mobility: Needs Assistance Bed Mobility: Rolling;Supine to Sit;Sit to Supine Rolling: Mod assist;+2 for physical assistance Sidelying to sit: Mod assist;+2 for safety/equipment;HOB elevated     Sit to sidelying: Max assist;+2 for safety/equipment General bed mobility comments: Increased time and effort to come to upright at EOB, less  physical assist for rolling and trunk elevation this session. remains max assist to return to supine  Transfers Overall transfer level: Needs assistance Equipment used: None Transfers: Sit to/from Stand Sit to Stand: +2 safety/equipment;Max assist         General transfer comment: Attempted standing once again today, limited LE strength, unable to maintain support on his legs at this time.   Ambulation/Gait             General Gait Details: unable to attempt at this time   Stairs             Wheelchair Mobility    Modified Rankin (Stroke Patients Only)       Balance Overall balance assessment: Needs assistance Sitting-balance support: Feet supported Sitting balance-Leahy Scale: Poor Sitting balance - Comments: min to moderate assist at EOB with reliance on UE support and posterior assist Postural control: Posterior lean Standing balance support: Bilateral upper extremity supported Standing balance-Leahy Scale: Zero Standing balance comment: Patient with heavy reliance on UE support                             Cognition Arousal/Alertness: Awake/alert Behavior During Therapy: Anxious;Impulsive Overall Cognitive Status: Impaired/Different from baseline Area of Impairment: Following commands;Safety/judgement;Problem solving                       Following Commands: Follows one step commands consistently;Follows one step commands with increased time Safety/Judgement: Decreased awareness of deficits;Decreased awareness of safety   Problem Solving: Slow processing;Requires tactile cues;Requires verbal cues General Comments: Patient focused and perseverating on wanting a pepsi and wanting to go home      Exercises  General Comments General comments (skin integrity, edema, etc.): hygiene and pericare performed. Patinet incontinent of stool      Pertinent Vitals/Pain Pain Assessment: Faces Faces Pain Scale: Hurts even more Pain  Location: back Pain Descriptors / Indicators: Grimacing;Guarding;Moaning Pain Intervention(s): Monitored during session    Home Living                      Prior Function            PT Goals (current goals can now be found in the care plan section) Acute Rehab PT Goals Patient Stated Goal: "get me out of here" PT Goal Formulation: With patient Time For Goal Achievement: 06/25/18 Potential to Achieve Goals: Fair Progress towards PT goals: Progressing toward goals(modest progression)    Frequency    Min 4X/week      PT Plan Current plan remains appropriate    Co-evaluation              AM-PAC PT "6 Clicks" Mobility   Outcome Measure  Help needed turning from your back to your side while in a flat bed without using bedrails?: Total Help needed moving from lying on your back to sitting on the side of a flat bed without using bedrails?: Total Help needed moving to and from a bed to a chair (including a wheelchair)?: Total Help needed standing up from a chair using your arms (e.g., wheelchair or bedside chair)?: Total Help needed to walk in hospital room?: Total Help needed climbing 3-5 steps with a railing? : Total 6 Click Score: 6    End of Session Equipment Utilized During Treatment: Other (comment) Activity Tolerance: Patient limited by pain;Patient limited by fatigue Patient left: in bed;with call bell/phone within reach Nurse Communication: Mobility status;Need for lift equipment PT Visit Diagnosis: Unsteadiness on feet (R26.81);Difficulty in walking, not elsewhere classified (R26.2) Pain - Right/Left: Right Pain - part of body: Leg(and back at incision)     Time: 1610-9604: 0935-0958 PT Time Calculation (min) (ACUTE ONLY): 23 min  Charges:  $Therapeutic Activity: 23-37 mins                     Charlotte Crumbevon Ambriella Kitt, PT DPT  Board Certified Neurologic Specialist Acute Rehabilitation Services Pager [phone] Office [phone]    [name] 06/18/2018, 2:02 PM

## 2018-06-18 NOTE — Progress Notes (Addendum)
UPDATE  2:54pm, Received phone call from LedgewoodWendy from [facility], they do not contract with [employer].   CSW called and informed patients son. He is not at bedside. He asked if the CSW could leave a broad list in his fathers room and he will let us know when he makes a decision.   Drucilla Schmidtaitlin Faiz Weber, MSW, LCSW-A Clinical Social Worker [facility]  ___________________________  CSW saw in the PT notes that the patients son is agreeable to SNF. Patients son would like to send his [facility] to [facility] in [place], [place].   CSW called and left a voicemail with Toniann FailWendy the admissions Interior and spatial designerdirector at [facility]. CSW asked about bed availbility and what the fax number was.The facility phone number is [phone].   CSW is awaiting a phone call back.   CSW will continue to follow.   Drucilla Schmidtaitlin Rehana Uncapher, MSW, LCSW-A Clinical Social Worker [facility]

## 2018-06-18 NOTE — Progress Notes (Addendum)
Progress Note  Patient Name: [name] Date of Encounter: 06/18/2018  Primary Cardiologist: No primary care provider on file.   Subjective   Remains very agitated and confused.  Normal sinus rhythm with heart rates in the 90s  Inpatient Medications    Scheduled Meds: . atorvastatin  20 mg Oral q1800  . celecoxib  200 mg Oral BID  . chlorhexidine gluconate (MEDLINE KIT)  15 mL Mouth Rinse BID  . cholecalciferol  5,000 Units Oral Daily  . diltiazem  30 mg Oral QID  . DULoxetine  90 mg Oral Daily  . enoxaparin (LOVENOX) injection  0.5 mg/kg Subcutaneous Q24H  . mouth rinse  15 mL Mouth Rinse q12n4p  . metoprolol tartrate  50 mg Oral BID  . mirtazapine  15 mg Oral QHS  . multivitamin  1 tablet Oral Daily  . sodium chloride flush  3 mL Intravenous Q12H  . tamsulosin  0.4 mg Oral Daily  . tobramycin  1 drop Right Eye TID   Continuous Infusions: . sodium chloride    . lactated ringers     PRN Meds: acetaminophen, bisacodyl, cyclobenzaprine, lactated ringers, metoprolol tartrate, ondansetron **OR** ondansetron (ZOFRAN) IV, oxyCODONE, RESOURCE THICKENUP CLEAR, sodium chloride flush   Vital Signs    Vitals:   06/17/18 2124 06/18/18 0024 06/18/18 0500 06/18/18 0518  BP: (!) 155/66 110/70  (!) 149/95  Pulse: (!) 123 82 (!) 48   Resp:   (!) 30 16  Temp:  98.2 F (36.8 C)  97.9 F (36.6 C)  TempSrc:  Oral  Oral  SpO2:  98% 100% 97%  Weight:   (!) 140.1 kg   Height:        Intake/Output Summary (Last 24 hours) at 06/18/2018 0936 Last data filed at 06/18/2018 0516 Gross per 24 hour  Intake -  Output 1400 ml  Net -1400 ml   Filed Weights   06/16/18 0000 06/17/18 0500 06/18/18 0500  Weight: (!) 141.2 kg (!) 139.1 kg (!) 140.1 kg    Telemetry    Normal sinus rhythm with PVCs- Personally Reviewed  ECG    No new EKG to review- Personally Reviewed  Physical Exam   GEN: Well nourished, agitated and confused HEENT: Normal NECK: No JVD; No carotid  bruits LYMPHATICS: No lymphadenopathy CARDIAC:RRR, no murmurs, rubs, gallops RESPIRATORY:  Clear to auscultation without rales, wheezing or rhonchi  ABDOMEN: Soft, non-tender, non-distended MUSCULOSKELETAL:  No edema; No deformity  SKIN: Warm and dry NEUROLOGIC:  Alert and oriented x 3 PSYCHIATRIC:  Normal affect    Labs    Chemistry Recent Labs  Lab 06/16/18 0416 06/17/18 0311 06/18/18 0249  NA 148* 152* 155*  K 4.0 4.1 3.8  CL 109 114* 115*  CO2 _0 GLUCOSE 199* 167* 155*  BUN 43* 33* 34*  CREATININE 1.03 0.99 1.17  CALCIUM 9.5 9.6 9.5  GFRNONAA >60 >60 60*  GFRAA >60 >60 >60  ANIONGAP _1 Hematology Recent Labs  Lab 06/14/18 0313 06/14/18 1829 06/15/18 0453  WBC 12.0* 12.6* 11.3*  RBC 4.90 4.82 4.69  HGB 14.2 14.3 13.6  HCT 46.3 45.3 44.5  MCV 94.5 94.0 94.9  MCH 29.0 29.7 29.0  MCHC 30.7 31.6 30.6  RDW 14.1 14.1 14.3  PLT 274 313 257    Cardiac EnzymesNo results for input(s): TROPONINI in the last 168 hours. No results for input(s): TROPIPOC in the last 168 hours.   BNP Recent Labs  Lab  06/17/18 0311  BNP 120.5*     DDimer No results for input(s): DDIMER in the last 168 hours.   Radiology    No results found.  Cardiac Studies   none  Patient Profile     77 y.o. male  with a history of degenerative disc dzs s/p multiple spinal surgeries, PAF, HTN, chronic pain, thoracic Ao aneurysm, anxiety, depression, and OSA, who is being seen for the evaluation of rapid afib   Assessment & Plan    1.  PAF -He has had PAF in the past associated with nephrolithiasis and acute pain in 2017 and now after T11-12 laminectomy complicated by acute respiratory failure and pain -Converted to normal sinus rhythm with PVCs with heart rate in the 90s -He had been on Eliquis in the past but stopped due to concern over frequent falls.  CHADS2VASC score of 5. -Start Eliquis 35m BID once cleared by neurosurgery -Will need to be on Eliquis for at  least 3 weeks uninterrupted prior to planning cardioversion -Continue Lopressor 50 mg twice daily -Heart rate improved after addition of Cardizem for better heart rate control  2.  Acute Respiratory Failure -resolved  3.  Chronic back pain/DDD  -s/p T11-12 laminectomy -per neurosurgery/TRH  4.  HTN -BP borderline controlled  -continue Lopressor 50 mg twice daily -Increase Cardizem to 60 mg every 6 hours -cannot swallow the long-acting tablet  5.  HFpEF - He put out 1.4 L yesterday and is net -11.195 L -weight is down 31 lbs since admit.  Weight bumped 2 pounds since yesterday despite good diuresis -He is currently not on diuretic therapy but had been on IV Lasix up until a few days ago -Creatinine stable at 1.17 -BNP minimally elevated at 120 today although this may be falsely low due to underlying obesity -Lungs are clear on exam today and he has no edema- appears euvolemic  6.  Elevated troponin -Only minimally elevated at 0.04 and 0.03 on 12/21. -Is a flat trend consistent with demand ischemia in the setting of acute respiratory failure and rapid A. Fib -[facility] -2D echo yesterday showed preserved LV function with EF 60 to 65% with akinesis of the basal inferior myocardium.  No plan for repeat ischemic evaluation at this time   7.  Frequent PVCs/bigeminy -Keep potassium and magnesium repleted -2D echo with normal LV function  [facility] will sign off.   Medication Recommendations: Lopressor 50 mg twice daily, Cardizem next he milligrams every 6 hours, atorvastatin 20 mg daily.  Eliquis 5 mg twice daily when okay with neurosurgery. Other recommendations (labs, testing, etc):  none Follow up as an outpatient: [facility]  For questions or updates, please contact [place]Please consult [email or web address] for contact info under  Cardiology/STEMI.      Signed, TFransico Him MD  06/18/2018, 9:36 AM

## 2018-06-18 NOTE — Progress Notes (Signed)
   Providing Compassionate, Quality Care - Together   Subjective: No reported events overnight, pt not in his room for a diagnostic study when I was rounding this morning.  Objective: Vital signs in last 24 hours: Temp:  [97.8 F (36.6 C)-98.8 F (37.1 C)] 97.9 F (36.6 C) (12/31 0518) Pulse Rate:  [48-123] 48 (12/31 0500) Resp:  [16-30] 16 (12/31 0518) BP: (110-155)/(66-95) 149/95 (12/31 0518) SpO2:  [97 %-100 %] 97 % (12/31 0518) Weight:  [140.1 kg] 140.1 kg (12/31 0500)  Intake/Output from previous day: 12/30 0701 - 12/31 0700 In: -  Out: 1400 [Urine:1400] Intake/Output this shift: No intake/output data recorded.   Lab Results: No results for input(s): WBC, HGB, HCT, PLT in the last 72 hours. BMET Recent Labs    06/17/18 0311 06/18/18 0249  NA 152* 155*  K 4.1 3.8  CL 114* 115*  CO2 28 29  GLUCOSE 167* 155*  BUN 33* 34*  CREATININE 0.99 1.17  CALCIUM 9.6 9.5    Studies/Results: No results found.  Assessment/Plan: [age]yo man s/p T11-T12 decompressive laminectomy w/ post-op respiratory failure, extubated 12/24, also w/ post-op PAF.  -cards recs for PAF -no change in neurosurgical plan of care at this time -dressing changes BID and prn -PT/OT rec SNF  Jadene Pierinihomas A Ostergard, MD

## 2018-06-18 NOTE — Progress Notes (Signed)
Nutrition Follow-up  DOCUMENTATION CODES:   Morbid obesity  INTERVENTION:    Dysphagia 1-pudding thick diet per SLP recommendations  Magic cup TID with meals, each supplement provides 290 kcal and 9 grams of protein  NEW NUTRITION DIAGNOSIS:   Increased nutrient needs related to chronic illness as evidenced by estimated needs, ongoing  NEW GOAL:   Patient will meet greater than or equal to 90% of their needs, progressing  MONITOR:   PO intake, Supplement acceptance, Labs, Skin, Weight trends  ASSESSMENT:   Pt with PMH of HTN, Depression, morbid obesity, OSA, Afib, HF and numerous prior spinal surgeries. Has had increasing sensory loss + pain in BLE r/t  spinal stenosis s/p decompression 12/20. Extubated postop, but developed Afib and Acute resp failure necessitating reintubation.    12/24 pt extubated  Pt with some confusion upon RD visit today. S/p MBSS 12/26; advanced to Dys 2-pudding thick liquids. Spoke with Hansel StarlingAdrienne, RN; pt is not really eating well.  Per flowsheets records, PO intake 100% at dinner 12/29. Labs & medications reviewed. Na 155 (H). CBG 180.  Diet Order:   Diet Order            DIET DYS 2 Room service appropriate? Yes; Fluid consistency: Pudding Thick  Diet effective now             EDUCATION NEEDS:   Not appropriate for education at this time  Skin:  Skin Assessment: Skin Integrity Issues: Skin Integrity Issues:: Other (Comment) Other: Cellulitis R leg, MASD to groin, Surgical incision to back   Last BM:  12/29  Height:   Ht Readings from Last 1 Encounters:  06/11/18 5\' 8"  (1.727 m)   Weight:   Wt Readings from Last 1 Encounters:  06/18/18 (!) 140.1 kg   Ideal Body Weight:  70 kg  BMI:  Body mass index is 46.96 kg/m.  Estimated Nutritional Needs:   Kcal:  1800-2000  Protein:  90-105 gm  Fluid:  1.8-2.0 L  Maureen ChattersKatie Shekera Beavers, RD, LDN Pager #: [phone] After-Hours Pager #: [phone]

## 2018-06-18 NOTE — Progress Notes (Signed)
Modified Barium Swallow Progress Note  Patient Details  Name: [name] MRN: [id number] Date of Birth: 01-Mar-1941  Today's Date: 06/18/2018  Modified Barium Swallow completed.  Full report located under Chart Review in the Imaging Section.  Brief recommendations include the following:  Clinical Impression  Repeat MBS focused on possible improvement in swallow function given increased time after extubation. Unfortunately, swallow function essentially unchanged. Pt continues to silently aspirate nectar thick liquids due to delayed swallow initiation and poor neck mobility. Cues to orally hold bolus did not improve timing of swallow trigger.  Reducing nectar thick bolus size to teaspoon was also not fully beneficial. Larger, self fed honey thick boluses (cup/straw) still led to silent aspiration after the swallow of mild residuals. If cued to clear throat and swallow again pt can clear these, but still full suprevision is needed given pts very poor awareness and memory. Pt will continue to need full superivision with Po from staff or family if strict aspiration precautions are to be followed. He is at risk of respiratory compromise. Suspect problems are more chronic than acute and may warrant discussion about goals of care with family and MD.    Swallow Evaluation Recommendations       SLP Diet Recommendations: Dysphagia 1 (Puree) solids;Pudding thick liquid;Honey thick liquids(honey thick full supervision teaspoon sips/throat clear)   Liquid Administration via: Spoon;Cup   Medication Administration: Whole meds with puree   Supervision: Full assist for feeding;Staff to assist with self feeding;Full supervision/cueing for compensatory strategies   Compensations: Slow rate;Small sips/bites;Clear throat intermittently;Multiple dry swallows after each bite/sip   Postural Changes: Remain semi-upright after after feeds/meals (Comment);Seated upright at 90 degrees   Oral Care  Recommendations: Oral care BID        [name], [name] 06/18/2018,11:34 AM

## 2018-06-18 NOTE — Progress Notes (Addendum)
Patient's son called the social worker back. He had a lot of questions regarding his father's medical care and physical therapy process. CSW gave as many updates as she could, he wanted to speak with the physical therapist. CSW asked if he had made a decision regarding a SNF about his father. He stated that he liked [facility] in [place]. CSW would call/fax out his fathers info.   CSW called Physical Therapist. She would call the patient's son and give him an update.    CSW called and left a voicemail for patient's son to call her back and discuss discharge options for his father.   CSW left her phone number to be reached back at.   [name], MSW, LCSW-A Clinical Social Worker [facility]

## 2018-06-19 DIAGNOSIS — R0902 Hypoxemia: Secondary | ICD-10-CM

## 2018-06-19 HISTORY — DX: Hypoxemia: R09.02

## 2018-06-19 MED ORDER — ACETAMINOPHEN 160 MG/5ML PO SOLN
1000.0000 mg | Freq: Three times a day (TID) | ORAL | Status: DC
  Administered 2018-06-19 – 2018-06-28 (×27): 1000 mg via ORAL
  Filled 2018-06-19 (×27): qty 40.6

## 2018-06-19 MED ORDER — OXYCODONE HCL 5 MG PO TABS
5.0000 mg | ORAL_TABLET | ORAL | Status: DC | PRN
  Administered 2018-06-19 – 2018-06-28 (×24): 5 mg via ORAL
  Filled 2018-06-19 (×26): qty 1

## 2018-06-19 MED ORDER — ACETAMINOPHEN 500 MG PO TABS
1000.0000 mg | ORAL_TABLET | Freq: Three times a day (TID) | ORAL | Status: DC
  Administered 2018-06-19: 1000 mg via ORAL
  Filled 2018-06-19: qty 2

## 2018-06-19 NOTE — Plan of Care (Signed)
  Problem: Clinical Measurements: Goal: Ability to maintain clinical measurements within normal limits will improve Outcome: Progressing   

## 2018-06-19 NOTE — Progress Notes (Signed)
Refuses CPAP. RT will continue to monitor.

## 2018-06-19 NOTE — Progress Notes (Signed)
Providing Compassionate, Quality Care - Together   Subjective: Patient's son reports patient is intermittently confused and hallucinates. Expresses concern that pain medication is causing symptoms. The patient's son is feeding him.  Objective: Vital signs in last 24 hours: Temp:  [97.5 F (36.4 C)-97.9 F (36.6 C)] 97.7 F (36.5 C) (01/01 0839) Pulse Rate:  [79-108] 82 (01/01 0839) Resp:  [16-23] 20 (01/01 0839) BP: (97-152)/(62-114) 152/114 (01/01 1229) SpO2:  [91 %-100 %] 100 % (01/01 0839) FiO2 (%):  [2 %] 2 % (01/01 0320) Weight:  [139.5 kg] 139.5 kg (01/01 0320)  Intake/Output from previous day: 12/31 0701 - 01/01 0700 In: 120 [P.O.:120] Out: 650 [Urine:650] Intake/Output this shift: Total I/O In: 130 [P.O.:120; I.V.:10] Out: 650 [Urine:650]  Alert and oriented to self and place with mild confusion MAE, BUE 5/5, BLE 4/5 PERRLA Incision with yellow, serous drainage, reddened edges  Lab Results: No results for input(s): WBC, HGB, HCT, PLT in the last 72 hours. BMET Recent Labs    06/17/18 0311 06/18/18 0249  NA 152* 155*  K 4.1 3.8  CL 114* 115*  CO2 28 29  GLUCOSE 167* 155*  BUN 33* 34*  CREATININE 0.99 1.17  CALCIUM 9.6 9.5    Studies/Results: Dg Swallowing Func-speech Pathology  Result Date: 06/18/2018 Objective Swallowing Evaluation: Type of Study: MBS-Modified Barium Swallow Study  Patient Details Name: Kayren EavesBuford L Lua MRN: [id number] Date of Birth: [birth year] Today's Date: 06/18/2018 Time: SLP Start Time (ACUTE ONLY): 0845 -SLP Stop Time (ACUTE ONLY): 0914 SLP Time Calculation (min) (ACUTE ONLY): 29 min Past Medical History: Past Medical History: Diagnosis Date . Anxiety  . Arthritis   back,joints,neck-limited mobility,flexion- . BPH (benign prostatic hyperplasia)  . Carpal tunnel syndrome  . Chronic pain  . Constipation due to pain medication  . Coronary artery calcification seen on CT scan   a. 02/2018 CT chest [facility]): coronary atherosclerosis w/ mild  amt of Ca2+ plaque in LAD distribution; b. 05/2018 MV ([facility]): EF 50%, no ischemia/infarct. Low risk. . Depression  . GERD (gastroesophageal reflux disease)  . Headache(784.0)  . Hepatitis B   "years ago" . History of echocardiogram   a. 08/2015 Echo: EF 55-60%, no rwma, mild AI, mildly dil LA. Nl PASP. Marland Kitchen. History of pulmonary embolism  . Hypertension  . Lumbar neuralgia  . PAF (paroxysmal atrial fibrillation) (HCC) 08/31/2015  a. Dx 08/2015 in setting of ureteral stone & pain-->initially placed on eliquis, later d/c'd by primary cardiologist; c. CHA2DS2VASc = 4-5. [facility] Kitchen. Sleep apnea 1990's  a. on AutoPap. . Thoracic aortic aneurysm (HCC) 02/25/2018  a. 02/2018 CT Chest [facility]): Asc thoracic Ao dil - max 4.6cm. Ao root 3.9cm. . Urinary frequency  Past Surgical History: Past Surgical History: Procedure Laterality Date . APPENDECTOMY   . BACK SURGERY    7 lumbar surgeries . CYSTOSCOPY WITH RETROGRADE PYELOGRAM, URETEROSCOPY AND STENT PLACEMENT Left 08/27/2015  Procedure: CYSTOSCOPY WITH BILATERAL RETROGRADE PYELOGRAM, URETEROSCOPY AND BILATERAL STENT PLACEMENT;  Surgeon: Sebastian Acheheodore Manny, MD;  Location: [facility];  Service: Urology;  Laterality: Left; . CYSTOSCOPY/URETEROSCOPY/HOLMIUM LASER/STENT PLACEMENT Left 10/18/2015  Procedure: CYSTOSCOPY/URETEROSCOPY/HOLMIUM LASER/STENT PLACEMENT;  Surgeon: Ihor GullyMark Ottelin, MD;  [facility] [place];  Service: Urology;  Laterality: Left; . JOINT REPLACEMENT Right 1990's  Rt shoulder . JOINT REPLACEMENT Right 2013  knee . LUMBAR LAMINECTOMY/DECOMPRESSION MICRODISCECTOMY N/A 06/07/2018  Procedure: Laminectomy - T11-T12;  Surgeon: Julio SicksPool, Henry, MD;  Location: [facility];  Service: Neurosurgery;  Laterality: N/A; HPI: Pt is a 78 yo male with severe spinal  stenosis s/p T11-T12 decompressive laminectomy 12/20.  He was extubated post-op, but then developed respiratory failure from pain medications in setting of OSA and required re-intubation 12/20-12/24. PMH: OSA, A fib, Thoracic aortic aneurysm,  Diastolic CHF, Arthritis, depression, Hep B  Subjective: pt alert,  hoarse Assessment / Plan / Recommendation CHL IP CLINICAL IMPRESSIONS 06/18/2018 Clinical Impression Repeat MBS focused on possible improvement in swallow function given increased time after extubation. Unfortunately, swallow function essentially unchanged. Pt continues to silently aspirate nectar thick liquids due to delayed swallow initiation and poor neck mobility. Cues to orally hold bolus did not improve timing of swallow trigger.  Reducing nectar thick bolus size to teaspoon was also not fully beneficial. Larger, self fed honey thick boluses (cup/straw) still led to silent aspiration after the swallow of mild residuals. If cued to clear throat and swallow again pt can clear these, but still full suprevision is needed given pts very poor awareness and memory. Pt will continue to need full superivision with Po from staff or family if strict aspiration precautions are to be followed. He is at risk of respiratory compromise. Suspect problems are more chronic than acute and may warrant discussion about goals of care with family and MD.  SLP Visit Diagnosis Dysphagia, oropharyngeal phase (R13.12) Attention and concentration deficit following -- Frontal lobe and executive function deficit following -- Impact on safety and function Severe aspiration risk   CHL IP TREATMENT RECOMMENDATION 06/18/2018 Treatment Recommendations Therapy as outlined in treatment plan below   Prognosis 06/18/2018 Prognosis for Safe Diet Advancement -- Barriers to Reach Goals Severity of deficits Barriers/Prognosis Comment -- CHL IP DIET RECOMMENDATION 06/18/2018 SLP Diet Recommendations Dysphagia 1 (Puree) solids;Pudding thick liquid;Honey thick liquids Liquid Administration via Spoon;Cup Medication Administration Whole meds with puree Compensations Slow rate;Small sips/bites;Clear throat intermittently;Multiple dry swallows after each bite/sip Postural Changes Remain  semi-upright after after feeds/meals (Comment);Seated upright at 90 degrees   CHL IP OTHER RECOMMENDATIONS 06/18/2018 Recommended Consults -- Oral Care Recommendations Oral care BID Other Recommendations --   CHL IP FOLLOW UP RECOMMENDATIONS 06/18/2018 Follow up Recommendations None   CHL IP FREQUENCY AND DURATION 06/18/2018 Speech Therapy Frequency (ACUTE ONLY) min 2x/week Treatment Duration 2 weeks      CHL IP ORAL PHASE 06/18/2018 Oral Phase Impaired Oral - Pudding Teaspoon -- Oral - Pudding Cup -- Oral - Honey Teaspoon Lingual pumping Oral - Honey Cup Lingual pumping Oral - Nectar Teaspoon Lingual pumping Oral - Nectar Cup Lingual pumping Oral - Nectar Straw Lingual pumping Oral - Thin Teaspoon -- Oral - Thin Cup -- Oral - Thin Straw -- Oral - Puree NT Oral - Mech Soft -- Oral - Regular NT Oral - Multi-Consistency -- Oral - Pill -- Oral Phase - Comment --  CHL IP PHARYNGEAL PHASE 06/18/2018 Pharyngeal Phase -- Pharyngeal- Pudding Teaspoon -- Pharyngeal -- Pharyngeal- Pudding Cup -- Pharyngeal -- Pharyngeal- Honey Teaspoon Penetration/Aspiration before swallow;Penetration/Aspiration during swallow;Penetration/Apiration after swallow;Trace aspiration;Pharyngeal residue - valleculae;Pharyngeal residue - pyriform Pharyngeal Material enters airway, passes BELOW cords without attempt by patient to eject out (silent aspiration);Material enters airway, CONTACTS cords and not ejected out;Material enters airway, remains ABOVE vocal cords then ejected out;Material does not enter airway Pharyngeal- Honey Cup Penetration/Aspiration before swallow;Penetration/Aspiration during swallow;Penetration/Apiration after swallow;Trace aspiration;Pharyngeal residue - valleculae;Pharyngeal residue - pyriform Pharyngeal Material enters airway, passes BELOW cords without attempt by patient to eject out (silent aspiration);Material enters airway, CONTACTS cords and not ejected out;Material enters airway, remains ABOVE vocal cords then  ejected out;Material does not enter airway  Pharyngeal- Nectar Teaspoon Penetration/Aspiration before swallow;Moderate aspiration Pharyngeal Material enters airway, passes BELOW cords without attempt by patient to eject out (silent aspiration) Pharyngeal- Nectar Cup Penetration/Aspiration before swallow;Moderate aspiration Pharyngeal Material enters airway, passes BELOW cords without attempt by patient to eject out (silent aspiration) Pharyngeal- Nectar Straw Penetration/Aspiration before swallow;Moderate aspiration Pharyngeal Material enters airway, passes BELOW cords without attempt by patient to eject out (silent aspiration) Pharyngeal- Thin Teaspoon -- Pharyngeal -- Pharyngeal- Thin Cup -- Pharyngeal -- Pharyngeal- Thin Straw -- Pharyngeal -- Pharyngeal- Puree NT Pharyngeal -- Pharyngeal- Mechanical Soft -- Pharyngeal -- Pharyngeal- Regular NT Pharyngeal -- Pharyngeal- Multi-consistency -- Pharyngeal -- Pharyngeal- Pill -- Pharyngeal -- Pharyngeal Comment --  No flowsheet data found. DeBlois, Riley Nearing 06/18/2018, 11:35 AM               Assessment/Plan: Mr. Lundrigan is 12 days s/p T11-T12 decompressive laminectomy. He failed extubation post-operatively and required reintubation. He was extubated 12/24. He uses a CPAP at night.   LOS: 12 days   -Continue working with therapies, recommending SNF at discharge -Cardiology following and managing PAF -Reducing oxycodone dose and scheduling Tylenol as patient is somewhat delirious -Continue to change dressing BID and prn  Val Eagle, DNP, AGNP-C Nurse Practitioner  [facility] [address], [address], [place], [place] [address] P: [phone]    F: [phone]  06/19/2018, 1:41 PM

## 2018-06-20 NOTE — Progress Notes (Signed)
CSW called [facility], Marylene Land at [phone]- they are unable to accept patients for the next 2 weeks due to the Noro Virus.  Antony Blackbird, [facility] Clinical Social Worker [phone]

## 2018-06-20 NOTE — Progress Notes (Signed)
  Speech Language Pathology Treatment: Dysphagia  Patient Details Name: [name] MRN: [id number] DOB: [birth year] Today's Date: 06/20/2018 Time: [id number] SLP Time Calculation (min) (ACUTE ONLY): 19 min  Assessment / Plan / Recommendation Clinical Impression  Pt tolerated simulate ground consistency and small amounts of honey thick liquid by teaspoon.  Pt required consistent cuing to utilize throat clear following honey thick liquid.  Pt completed 25 swallows with SLP.  Pt continues to request thin liquids (ice cream.) Provided education regarding diet modifications.  Likely needs reinforcement for pt.  Wife verbalized understanding.    Pt demonstrated ability to execute some pharyngeal strengthening exercises with maximal cuing/instruction Masako Repetition: 3 Effort: Good Accuracy: Good Comment: Appeared to fatigue  Mendelssohn Repetitions: 3 Effort: Good Accuracy: Poor  Chin Tuck Against Resistance Repetitions: 10 Effort: Fair Accuracy: Fair  Effortful Swallow Repetitions: 25 Comment: Encouraged pt to use effortful swallow with trials of HTL.  Unable to assess effort/accuracy.   HPI HPI: Pt is a 78 yo male with severe spinal stenosis s/p T11-T12 decompressive laminectomy 12/20. He was extubated post-op, but then developed respiratory failure from pain medications in setting of OSA and required re-intubation 12/20-12/24. PMH: OSA, A fib, Thoracic aortic aneurysm, Diastolic CHF, Arthritis, depression, Hep B.  No new chest imaging since MBSS on 06/18/18      SLP Plan  Continue with current plan of care       Recommendations  Diet recommendations: Dysphagia 2 (fine chop);Pudding-thick liquid;Honey-thick liquid Medication Administration: Whole meds with puree Supervision: Full supervision/cueing for compensatory strategies Compensations: Slow rate;Small sips/bites;Clear throat after each swallow;Multiple dry swallows after each bite/sip Postural Changes and/or Swallow  Maneuvers: Seated upright 90 degrees                Follow up Recommendations: (Continue ST at next level of care) SLP Visit Diagnosis: Dysphagia, oropharyngeal phase (R13.12) Plan: Continue with current plan of care       GO                Kerrie Pleasure, MA, CCC-SLP Acute Rehabilitation Services Office: [phone] 06/20/2018, 4:11 PM

## 2018-06-20 NOTE — Progress Notes (Signed)
   Providing Compassionate, Quality Care - Together   Subjective: Patient working with physical therapy at time of assessment. Therapist reports he is more coherent this morning. He is sitting on the edge of the bed. Patient is sore, but is able to interact better with physical therapy this morning.  Objective: Vital signs in last 24 hours: Temp:  [97.7 F (36.5 C)-99.2 F (37.3 C)] 98.2 F (36.8 C) (01/02 0824) Pulse Rate:  [68-77] 77 (01/02 0824) Resp:  [20-27] 20 (01/02 0824) BP: (90-152)/(45-114) 90/45 (01/02 0824) SpO2:  [92 %-96 %] 94 % (01/02 0824)  Intake/Output from previous day: 01/01 0701 - 01/02 0700 In: 970 [P.O.:960; I.V.:10] Out: 1100 [Urine:1100] Intake/Output this shift: Total I/O In: 10 [I.V.:10] Out: -   Alert and oriented x 3  MAE, BUE 5/5, BLE 4/5 PERRLA Incision with very small amount of yellow, serous drainage, reddened edges, improved from yesterday.  Lab Results: No results for input(s): WBC, HGB, HCT, PLT in the last 72 hours. BMET Recent Labs    06/18/18 0249  NA 155*  K 3.8  CL 115*  CO2 29  GLUCOSE 155*  BUN 34*  CREATININE 1.17  CALCIUM 9.5    Assessment/Plan: Mr. Cimorelli [id number] s/p T11-T12 decompressive laminectomy. He failed extubation post-operatively and required reintubation. He was extubated 12/24. He uses aCPAP at night, but refused it last night. He developed confusion that appears to be related to opioid medication as it's improved since oxycodone was reduced.   LOS: 13 days    -Continue working with therapies, still recommending SNF at discharge. Son made a decision regarding SNF selection yesterday -Cardiology following and managing PAF -Continue to change dressing BID and prn  Val Eagle, DNP, AGNP-C Nurse Practitioner  [facility] [address], [address], [place], [place] [address] P: [phone]    F: [phone]  06/20/2018, 11:40 AM

## 2018-06-20 NOTE — Progress Notes (Signed)
Physical Therapy Treatment Patient Details Name: [name] MRN: [id number] DOB: 1940/10/15 Today's Date: 06/20/2018    History of Present Illness 78 yo male with severe spinal stenosis had T11-T12 decompressive laminectomy 12/20.  Post op developed respiratory failure from pain medications in setting of OSA and required intubation. A fib, RVR; extubated 12/24.Patient status post operative fusion from L1-L5.  OMH: depression; Hepatits; Hepatitis B; HTN; lumbar neuralgia.    PT Comments    Pt making slow progress. Remains confused and weak. Currently unable to amb due to this.    Follow Up Recommendations  SNF     Equipment Recommendations  None recommended by PT    Recommendations for Other Services       Precautions / Restrictions Precautions Precautions: Back;Fall Precaution Booklet Issued: No Precaution Comments: pt with cognitive impairments currently and unable to remember precautions Restrictions Weight Bearing Restrictions: No    Mobility  Bed Mobility Overal bed mobility: Needs Assistance Bed Mobility: Rolling;Sidelying to Sit;Sit to Sidelying Rolling: Mod assist;+2 for physical assistance Sidelying to sit: Mod assist;+2 for safety/equipment;HOB elevated     Sit to sidelying: Max assist;+2 for safety/equipment General bed mobility comments: Verbal cues for technique. Assist to bring hips over to roll, to bring legs off of bed, to elevate trunk into sitting and bring hips to EOB. Returning to sidelying pt trying to lie on back instead of coming down on side as instructed.   Transfers Overall transfer level: Needs assistance Equipment used: None Transfers: Sit to/from Stand Sit to Stand: +2 safety/equipment;Max assist         General transfer comment: Assist to bring hips and trunk up. Pt stood x 3 briefly <10 sec with +2 mod assist to maintain. Pt with flexed posture and unable to obtain full upright  Ambulation/Gait             General Gait  Details: unable to attempt at this time   Stairs             Wheelchair Mobility    Modified Rankin (Stroke Patients Only)       Balance Overall balance assessment: Needs assistance Sitting-balance support: Feet supported;Bilateral upper extremity supported Sitting balance-Leahy Scale: Poor Sitting balance - Comments: UE support   Standing balance support: Bilateral upper extremity supported Standing balance-Leahy Scale: Zero Standing balance comment: Pt stood x 3 without fully extending hips and trunk. Stood <10 sec each time.                            Cognition Arousal/Alertness: Awake/alert Behavior During Therapy: Anxious;Impulsive Overall Cognitive Status: Impaired/Different from baseline Area of Impairment: Following commands;Safety/judgement;Problem solving;Attention                   Current Attention Level: Sustained   Following Commands: Follows one step commands consistently Safety/Judgement: Decreased awareness of deficits;Decreased awareness of safety   Problem Solving: Slow processing;Requires tactile cues;Requires verbal cues General Comments: Pt requires frequent redirection to stay on task      Exercises      General Comments General comments (skin integrity, edema, etc.): Pt incontinent of stool and urine. Bed linens changed and pericare initiated with nursing to complete.       Pertinent Vitals/Pain Pain Assessment: Faces Faces Pain Scale: Hurts even more Pain Location: back Pain Descriptors / Indicators: Grimacing;Guarding;Moaning Pain Intervention(s): Monitored during session;Repositioned    Home Living  Prior Function            PT Goals (current goals can now be found in the care plan section) Progress towards PT goals: Progressing toward goals(slowly)    Frequency    Min 3X/week      PT Plan Current plan remains appropriate;Frequency needs to be updated     Co-evaluation              AM-PAC PT "6 Clicks" Mobility   Outcome Measure  Help needed turning from your back to your side while in a flat bed without using bedrails?: Total Help needed moving from lying on your back to sitting on the side of a flat bed without using bedrails?: Total Help needed moving to and from a bed to a chair (including a wheelchair)?: Total Help needed standing up from a chair using your arms (e.g., wheelchair or bedside chair)?: Total Help needed to walk in hospital room?: Total Help needed climbing 3-5 steps with a railing? : Total 6 Click Score: 6    End of Session Equipment Utilized During Treatment: Gait belt;Oxygen Activity Tolerance: Patient limited by fatigue Patient left: in bed;with call bell/phone within reach;with bed alarm set Nurse Communication: Mobility status;Need for lift equipment PT Visit Diagnosis: Unsteadiness on feet (R26.81);Difficulty in walking, not elsewhere classified (R26.2);Pain Pain - part of body: (and back at incision)     Time: 1125-1206 PT Time Calculation (min) (ACUTE ONLY): 41 min  Charges:  $Therapeutic Activity: 38-52 mins                     [facility] PT Acute Rehabilitation Services Pager [phone] Office [phone]    Angelina Ok [facility] 06/20/2018, 1:45 PM

## 2018-06-20 NOTE — Care Management Important Message (Signed)
Important Message  Patient Details  Name: [name] MRN: [id number] Date of Birth: Jun 07, 1941   Medicare Important Message Given:  Yes    Mae Denunzio P Orvan Papadakis 06/20/2018, 4:13 PM

## 2018-06-20 NOTE — Plan of Care (Signed)
  Problem: Education: Goal: Knowledge of General Education information will improve Description Including pain rating scale, medication(s)/side effects and non-pharmacologic comfort measures Outcome: Progressing   

## 2018-06-21 MED ORDER — ENOXAPARIN SODIUM 80 MG/0.8ML ~~LOC~~ SOLN
0.5000 mg/kg | SUBCUTANEOUS | Status: DC
  Administered 2018-06-22 – 2018-06-28 (×7): 70 mg via SUBCUTANEOUS
  Filled 2018-06-21 (×7): qty 0.8

## 2018-06-21 NOTE — Plan of Care (Signed)
  Problem: Education: Goal: Required Educational Video(s) Outcome: Progressing   Problem: Clinical Measurements: Goal: Ability to maintain clinical measurements within normal limits will improve Outcome: Progressing Goal: Postoperative complications will be avoided or minimized Outcome: Progressing   Problem: Respiratory: Goal: Ability to maintain a clear airway and adequate ventilation will improve Outcome: Progressing

## 2018-06-21 NOTE — Progress Notes (Signed)
Patient refused CPAP machine. 

## 2018-06-21 NOTE — Progress Notes (Signed)
Received report from [employer], night nurse, at bedside in room 4 east 21. Patient did not have any food or drinks on his side table, and table was not within reach of the patient. After shift change myself and Irving Burton, nurse tech rounded, and then we both went into the patient's room. Irving Burton nurse tech also witnessed and verified that the patient did not have any drinks or fluids with in his reach or on his side table which was not within his arms reach.  At this time it was noticed that the patient's breakfast tray was on his side table, and was moved from the wall and placed next to the patient. Allowing the patient to feed himself and drink fluids with out supervision. The patient is a high aspiration risk. The patient must have supervision during meals, and oral fluids must be thickened to pudding consistency, and administered to the patient with a spoon.  Notified Museum/gallery exhibitions officer.A sign was placed on the patient's door to leave tray at nurses station for RN or tech notification, tray is not to be left for the patient. And dietary was verbally notified about the morning incident, at time of the lunch tray administration.

## 2018-06-21 NOTE — Progress Notes (Signed)
Nutrition Follow-up  DOCUMENTATION CODES:   Morbid obesity  INTERVENTION:    Magic cup TID with meals, each supplement provides 290 kcal and 9 grams of protein  NEW NUTRITION DIAGNOSIS:   Increased nutrient needs related to chronic illness as evidenced by estimated needs, ongoing  NEW GOAL:   Patient will meet greater than or equal to 90% of their needs, progressing  MONITOR:   PO intake, Supplement acceptance, Labs, Skin, Weight trends  ASSESSMENT:   Pt with PMH of HTN, Depression, morbid obesity, OSA, Afib, HF and numerous prior spinal surgeries. Has had increasing sensory loss + pain in BLE r/t  spinal stenosis s/p decompression 12/20. Extubated postop, but developed Afib and Acute resp failure necessitating reintubation.    12/24 pt extubated  Pt continues on a Dys 2-pudding thick liquid diet. He's less confused and more coherent; progressing with PT. Pt's PO intake improved; average is 55% per flowsheet records.  Speech Path continues to follow for dysphagia treatments. Receiving Magic Cup dessert supplement on meal trays. SNF at discharge.  Diet Order:   Diet Order            DIET DYS 2 Room service appropriate? Yes; Fluid consistency: Pudding Thick  Diet effective now             EDUCATION NEEDS:   Not appropriate for education at this time  Skin:  Skin Assessment: Skin Integrity Issues: Skin Integrity Issues:: Other (Comment) Other: Cellulitis R leg, MASD to groin, Surgical incision to back   Last BM:  1/3  Height:   Ht Readings from Last 1 Encounters:  06/11/18 5\' 8"  (1.727 m)   Weight:   Wt Readings from Last 1 Encounters:  06/19/18 (!) 139.5 kg   Ideal Body Weight:  70 kg  BMI:  Body mass index is 46.76 kg/m.  Estimated Nutritional Needs:   Kcal:  1800-2000  Protein:  90-105 gm  Fluid:  1.8-2.0 L  Maureen Chatters, RD, LDN Pager #: [phone] After-Hours Pager #: [phone]

## 2018-06-21 NOTE — Plan of Care (Signed)
  Problem: Education: Goal: Required Educational Video(s) Outcome: Progressing   

## 2018-06-21 NOTE — Progress Notes (Signed)
   Providing Compassionate, Quality Care - Together   Subjective: Patient's nurse reports patient's confusion is much improved. Patient complains of pain with movement, but has done well overall with reduction in oxycodone dose. There is some concern by nursing for aspiration this morning as patient was eating unassisted. She reports some coughing following the incident. He has not developed a fever or required supplemental oxygen.  Objective: Vital signs in last 24 hours: Temp:  [97.5 F (36.4 C)-98.4 F (36.9 C)] 97.6 F (36.4 C) (01/03 1528) Pulse Rate:  [55-83] 70 (01/03 1528) Resp:  [20-38] 38 (01/03 1528) BP: (106-138)/(56-80) 110/57 (01/03 1528) SpO2:  [94 %-96 %] 96 % (01/03 1528)  Intake/Output from previous day: 01/02 0701 - 01/03 0700 In: 1570 [P.O.:1560; I.V.:10] Out: 300 [Urine:300] Intake/Output this shift: Total I/O In: 240 [P.O.:240] Out: 500 [Urine:500]  Alert and orientedx 3  MAE, BUE 5/5, BLE 4/5 PERRLA Incision with very small amount of serous drainage, reddened edges  Lab Results: No results for input(s): WBC, HGB, HCT, PLT in the last 72 hours. BMET No results for input(s): NA, K, CL, CO2, GLUCOSE, BUN, CREATININE, CALCIUM in the last 72 hours.  Studies/Results: No results found.  Assessment/Plan: Mr. Noguez [id number] s/p T11-T12 decompressive laminectomy. He failed extubation post-operatively and required reintubation. He was extubated 12/24. He uses aCPAP at night. He developed confusion that appears to be related to opioid medication as it's improved since oxycodone was reduced.    LOS: 14 days    -Continue working with therapies, still recommending SNF at discharge. -Continue to change dressing BID and prn -Monitor for signs of increased respiratory effort. Will obtain chest x-ray if symptoms develop.   Val Eagle, DNP, AGNP-C Nurse Practitioner  [facility] [address],  [place], [place] [address] P: [phone]    F: [phone]  06/21/2018, 5:19 PM

## 2018-06-22 LAB — CBC
HCT: 39.1 % (ref 39.0–52.0)
Hemoglobin: 11.7 g/dL — ABNORMAL LOW (ref 13.0–17.0)
MCH: 28.9 pg (ref 26.0–34.0)
MCHC: 29.9 g/dL — ABNORMAL LOW (ref 30.0–36.0)
MCV: 96.5 fL (ref 80.0–100.0)
NRBC: 0 % (ref 0.0–0.2)
Platelets: 170 10*3/uL (ref 150–400)
RBC: 4.05 MIL/uL — ABNORMAL LOW (ref 4.22–5.81)
RDW: 14.3 % (ref 11.5–15.5)
WBC: 6.8 10*3/uL (ref 4.0–10.5)

## 2018-06-22 LAB — CREATININE, SERUM
Creatinine, Ser: 0.76 mg/dL (ref 0.61–1.24)
GFR calc Af Amer: >60 mL/min (ref >60)
GFR calc non Af Amer: >60 mL/min (ref >60)

## 2018-06-22 MED ORDER — GUAIFENESIN-DM 100-10 MG/5ML PO SYRP
5.0000 mL | ORAL_SOLUTION | ORAL | Status: DC | PRN
  Administered 2018-06-22 – 2018-06-24 (×4): 5 mL via ORAL
  Filled 2018-06-22 (×6): qty 5

## 2018-06-23 NOTE — Progress Notes (Signed)
Subjective: Patient reports legs doing better  Objective: Vital signs in last 24 hours: Temp:  [97.5 F (36.4 C)-98.2 F (36.8 C)] 98.2 F (36.8 C) (01/05 0833) Pulse Rate:  [46-82] 82 (01/05 0840) Resp:  [19-22] 21 (01/05 0333) BP: (82-136)/(39-80) 105/51 (01/05 0833) SpO2:  [91 %-100 %] 91 % (01/05 0333) Weight:  [144.2 kg] 144.2 kg (01/05 0333)  Intake/Output from previous day: 01/04 0701 - 01/05 0700 In: 60 [P.O.:50; I.V.:10] Out: 1150 [Urine:1150] Intake/Output this shift: Total I/O In: 10 [I.V.:10] Out: -   Physical Exam: Good bilateral distal lower extremity strength.  In bed  Lab Results: Recent Labs    06/22/18 0526  WBC 6.8  HGB 11.7*  HCT 39.1  PLT 170   BMET Recent Labs    06/22/18 0526  CREATININE 0.76    Studies/Results: No results found.  Assessment/Plan: Continuing Rehab.  Will likely require SNF placement. Dr. Jordan Likes to follow patient's progress.    LOS: 16 days    Dorian Heckle, MD 06/23/2018, 9:32 AM

## 2018-06-23 NOTE — Progress Notes (Signed)
Placed patient on CPAP for HS. Previous settings of auto titrate (max 20.0, min 5.0 cm H20) with 3 lpm o2 bleed in. RN aware.

## 2018-06-24 NOTE — Plan of Care (Signed)
  Problem: Education: Goal: Required Educational Video(s) Outcome: Progressing   Problem: Clinical Measurements: Goal: Ability to maintain clinical measurements within normal limits will improve Outcome: Progressing   

## 2018-06-24 NOTE — Progress Notes (Signed)
CSW called patient's son and left voice message-Currently, no beds available at [facility]. CSW requested return call with possible other options for SNF placement.  Antony Blackbird, [facility] Clinical Social Worker [phone]

## 2018-06-24 NOTE — Progress Notes (Signed)
Continues to progress slowly.  No LE pain,  Back pain imp;roved from preop.  Mobilizing slowly with PT. Resp status stable. Cont current care. Working on placement for further convalescence,

## 2018-06-24 NOTE — Care Management Important Message (Signed)
Important Message  Patient Details  Name: DEMIAN VOGELE MRN: [id number] Date of Birth: 1940/09/11   Medicare Important Message Given:  Yes    Oralia Rud Cassidi Modesitt 06/24/2018, 4:33 PM

## 2018-06-24 NOTE — Progress Notes (Signed)
  Speech Language Pathology Treatment: Dysphagia  Patient Details Name: [name] MRN: [id number] DOB: 01/13/1941 Today's Date: 06/24/2018 Time: 7048-8891 SLP Time Calculation (min) (ACUTE ONLY): 18 min  Assessment / Plan / Recommendation Clinical Impression  Pt just completed PT session when ST arrived and very tired, initially declining beverage then requested water. Following positioning elevating head of bed and using reverse Trendelenburg position, pt consumed pudding thick water (at bedside) in 1/2 tsp amounts. No s/s aspiration. Son and wife at bedside with son asking relevant, supportive questions re: possible causes, prognosis of his dysphagia etc. 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    HPI HPI: Pt is a 78 yo male with severe spinal stenosis s/p T11-T12 decompressive laminectomy 12/20. He was extubated post-op, but then developed respiratory failure from pain medications in setting of OSA and required re-intubation 12/20-12/24. PMH: OSA, A fib, Thoracic aortic aneurysm, Diastolic CHF, Arthritis, depression, Hep B.  No new chest imaging since MBSS on 06/18/18      SLP Plan  Continue with current plan of care       Recommendations  Liquids provided via: (1/2 teaspoon) Medication Administration: Crushed with puree Supervision: Staff to assist with self feeding;Full supervision/cueing for compensatory strategies Compensations: Slow rate;Small sips/bites Postural Changes and/or Swallow Maneuvers: Seated upright 90 degrees                Oral Care Recommendations: Oral care BID Follow up Recommendations: Skilled Nursing facility SLP Visit Diagnosis: Dysphagia, oropharyngeal phase (R13.12) Plan: Continue with current plan of care       GO                 Royce Macadamia 06/24/2018, 3:17 PM  Breck Coons Lonell Face.Ed Nurse, children's [phone] Office [phone]

## 2018-06-24 NOTE — Progress Notes (Signed)
Physical Therapy Treatment Patient Details Name: [name] MRN: [id number] DOB: Feb 09, 1941 Today's Date: 06/24/2018    History of Present Illness 78 yo male with severe spinal stenosis had T11-T12 decompressive laminectomy 12/20.  Post op developed respiratory failure from pain medications in setting of OSA and required intubation. A fib, RVR; extubated 12/24.Patient status post operative fusion from L1-L5.  OMH: depression; Hepatits; Hepatitis B; HTN; lumbar neuralgia.    PT Comments    Pt was seen for mobility and sat him up side of bed with a struggle to keep him focused on the activity.  He is too lethargic mentally with this work to get a stand up initiated, but is able to be scooted up the bed in several efforts due to his lack of tolerance for movement.  Nursing and family are all involved with his care today, and will anticipate his continued participation in therapy to try to get him standing.  Expect pt and family to keep being interested in therapy, but he is very cognitively limited and may level off for progress.   Follow Up Recommendations  SNF     Equipment Recommendations  None recommended by PT    Recommendations for Other Services       Precautions / Restrictions Precautions Precautions: Back;Fall Precaution Booklet Issued: No Precaution Comments: pt with cognitive impairments currently and unable to remember precautions Restrictions Weight Bearing Restrictions: No    Mobility  Bed Mobility Overal bed mobility: Needs Assistance Bed Mobility: Supine to Sit;Sit to Supine Rolling: Min assist   Supine to sit: Mod assist Sit to supine: Total assist   General bed mobility comments: complicated by pt being unable to lie flat to use better body mechanics  Transfers Overall transfer level: Needs assistance   Transfers: Sit to/from Stand           General transfer comment: pt is unable to assist STS due to cognition and LE exercises  Ambulation/Gait              General Gait Details: unable to attempt at this time   Stairs             Wheelchair Mobility    Modified Rankin (Stroke Patients Only)       Balance   Sitting-balance support: Feet supported;Bilateral upper extremity supported Sitting balance-Leahy Scale: Poor                                      Cognition Arousal/Alertness: Awake/alert Behavior During Therapy: Anxious;Impulsive Overall Cognitive Status: Impaired/Different from baseline Area of Impairment: Orientation;Attention;Memory;Following commands;Safety/judgement;Awareness;Problem solving                 Orientation Level: Situation;Time Current Attention Level: Selective Memory: Decreased recall of precautions;Decreased short-term memory Following Commands: Follows one step commands inconsistently;Follows one step commands with increased time Safety/Judgement: Decreased awareness of safety;Decreased awareness of deficits Awareness: Intellectual Problem Solving: Slow processing;Decreased initiation;Difficulty sequencing;Requires verbal cues;Requires tactile cues General Comments: redirected for all elements of therapy including his attention to shifting balance to midline and maintaining central posture are a struggle as pt gives up and falls back or to the right      Exercises      General Comments        Pertinent Vitals/Pain Pain Assessment: Faces Faces Pain Scale: Hurts even more Pain Location: back Pain Descriptors / Indicators: Operative site guarding;Grimacing Pain Intervention(s): Limited activity  within patient's tolerance;Monitored during session;Premedicated before session;Repositioned;Patient requesting pain meds-RN notified    Home Living                      Prior Function            PT Goals (current goals can now be found in the care plan section) Acute Rehab PT Goals Patient Stated Goal: to get home PT Goal Formulation: With  patient/family Potential to Achieve Goals: Fair Progress towards PT goals: Progressing toward goals    Frequency    Min 3X/week      PT Plan Current plan remains appropriate    Co-evaluation              AM-PAC PT "6 Clicks" Mobility   Outcome Measure  Help needed turning from your back to your side while in a flat bed without using bedrails?: Total Help needed moving from lying on your back to sitting on the side of a flat bed without using bedrails?: Total Help needed moving to and from a bed to a chair (including a wheelchair)?: Total Help needed standing up from a chair using your arms (e.g., wheelchair or bedside chair)?: Total Help needed to walk in hospital room?: Total Help needed climbing 3-5 steps with a railing? : Total 6 Click Score: 6    End of Session Equipment Utilized During Treatment: Oxygen Activity Tolerance: Patient limited by fatigue;Treatment limited secondary to agitation;Treatment limited secondary to medical complications (Comment) Patient left: in bed;with call bell/phone within reach;with bed alarm set;with family/visitor present Nurse Communication: Mobility status;Need for lift equipment PT Visit Diagnosis: Unsteadiness on feet (R26.81);Difficulty in walking, not elsewhere classified (R26.2);Pain Pain - Right/Left: Right Pain - part of body: Leg(and back)     Time: 1610-9604 PT Time Calculation (min) (ACUTE ONLY): 26 min  Charges:  $Therapeutic Activity: 23-37 mins                     Ivar Drape 06/24/2018, 5:31 PM   Samul Dada, PT MS Acute Rehab Dept. Number: [facility] [phone] and [facility] [phone]

## 2018-06-25 NOTE — Progress Notes (Signed)
Occupational Therapy Treatment Patient Details Name: [name] MRN: [id number] DOB: Nov 06, 1940 Today's Date: 06/25/2018    History of present illness 78 yo male with severe spinal stenosis had T11-T12 decompressive laminectomy 12/20.  Post op developed respiratory failure from pain medications in setting of OSA and required intubation. A fib, RVR; extubated 12/24.Patient status post operative fusion from L1-L5.  OMH: depression; Hepatits; Hepatitis B; HTN; lumbar neuralgia.   OT comments  Pt more alert and talkative this session. Pt with speech impairments and required repeating statements and slowing down speech for therapist to understand him. Pt with increased assist for bed mobility rolling left and right and unable to perform sup -s ti for EOB activity due to reports of increase back pain during bed mobility. Pt requires mod verbal and physical cues for back  Precautions during log roll technique. Pt able to perform grooming and UB ADLs tasks at bed level. OT will continue to follow acutely  Follow Up Recommendations  SNF;Supervision/Assistance - 24 hour    Equipment Recommendations  3 in 1 bedside commode;Other (comment)(TBD at next  venue of care)    Recommendations for Other Services      Precautions / Restrictions Precautions Precautions: Back;Fall Precaution Booklet Issued: No Precaution Comments: pt unable to recall back precautions due to cognitive impairments, reviewed with pt Restrictions Weight Bearing Restrictions: No       Mobility Bed Mobility Overal bed mobility: Needs Assistance Bed Mobility: Rolling Rolling: Mod assist         General bed mobility comments: required mod verbal cues for back precautions to log roll. Attempted sup - sit x 3 and pt unable although seemd to be more alert and talkative this session  Transfers                 General transfer comment: unable    Balance                                            ADL either performed or assessed with clinical judgement   ADL Overall ADL's : Needs assistance/impaired     Grooming: Wash/dry face;Wash/dry hands;Minimal assistance;Bed level   Upper Body Bathing: Moderate assistance;Bed level                                   Vision Baseline Vision/History: Wears glasses;Legally blind Wears Glasses: Reading only Patient Visual Report: No change from baseline     Perception     Praxis      Cognition Arousal/Alertness: Awake/alert Behavior During Therapy: Anxious;Impulsive Overall Cognitive Status: Impaired/Different from baseline Area of Impairment: Attention;Memory;Safety/judgement;Awareness;Problem solving                     Memory: Decreased recall of precautions;Decreased short-term memory Following Commands: Follows one step commands inconsistently;Follows one step commands with increased time     Problem Solving: Slow processing;Decreased initiation;Difficulty sequencing;Requires verbal cues;Requires tactile cues          Exercises     Shoulder Instructions       General Comments      Pertinent Vitals/ Pain       Pain Assessment: 0-10 Pain Score: 5  Pain Location: back during bed mobility Pain Descriptors / Indicators: Aching;Sore;Grimacing Pain Intervention(s): Limited activity within patient's tolerance;Monitored during session;Premedicated  before session;Repositioned  Home Living                                          Prior Functioning/Environment              Frequency  Min 2X/week        Progress Toward Goals  OT Goals(current goals can now be found in the care plan section)  Progress towards OT goals: OT to reassess next treatment     Plan Discharge plan remains appropriate    Co-evaluation                 AM-PAC OT "6 Clicks" Daily Activity     Outcome Measure   Help from another person eating meals?: A Little Help from another  person taking care of personal grooming?: A Little Help from another person toileting, which includes using toliet, bedpan, or urinal?: Total Help from another person bathing (including washing, rinsing, drying)?: A Lot Help from another person to put on and taking off regular upper body clothing?: Total Help from another person to put on and taking off regular lower body clothing?: Total 6 Click Score: 11    End of Session    OT Visit Diagnosis: Unsteadiness on feet (R26.81);Other abnormalities of gait and mobility (R26.89);Muscle weakness (generalized) (M62.81);Pain;Other symptoms and signs involving cognitive function Pain - part of body: (back)   Activity Tolerance Patient limited by pain   Patient Left in bed;with call bell/phone within reach;with bed alarm set   Nurse Communication          Time: 1007-1219 OT Time Calculation (min): 24 min  Charges: OT General Charges $OT Visit: 1 Visit OT Treatments $Self Care/Home Management : 8-22 mins $Therapeutic Activity: 8-22 mins     Galen Manila 06/25/2018, 2:10 PM

## 2018-06-25 NOTE — Progress Notes (Signed)
Overall stable.  No new issues or problems.  Continues to have difficulty with exercise tolerance and mobility.  Wound healing well.  Pain well controlled.  Continue current efforts.

## 2018-06-25 NOTE — Plan of Care (Signed)
  Problem: Clinical Measurements: Goal: Ability to maintain clinical measurements within normal limits will improve Outcome: Progressing   Problem: Clinical Measurements: Goal: Postoperative complications will be avoided or minimized Outcome: Progressing   

## 2018-06-26 ENCOUNTER — Encounter (HOSPITAL_COMMUNITY): Payer: Self-pay | Admitting: General Practice

## 2018-06-26 NOTE — Plan of Care (Signed)
  Problem: Clinical Measurements: Goal: Ability to maintain clinical measurements within normal limits will improve Outcome: Not Progressing   Problem: Activity: Goal: Ability to tolerate increased activity will improve Outcome: Not Progressing   Problem: Role Relationship: Goal: Method of communication will improve Outcome: Not Progressing

## 2018-06-26 NOTE — Progress Notes (Signed)
   Providing Compassionate, Quality Care - Together   Subjective: Patient reports pain fairly well controlled. Still pain with movement. The CNA is feeding the patient upon assessment.  Objective: Vital signs in last 24 hours: Temp:  [97.8 F (36.6 C)-98.5 F (36.9 C)] 97.8 F (36.6 C) (01/08 1204) Pulse Rate:  [64-90] 84 (01/08 1204) Resp:  [18-28] 20 (01/08 1204) BP: (113-151)/(51-90) 113/51 (01/08 1204) SpO2:  [92 %-98 %] 92 % (01/08 1204) Weight:  [144 kg] 144 kg (01/08 0500)  Intake/Output from previous day: 01/07 0701 - 01/08 0700 In: 600 [P.O.:600] Out: 1800 [Urine:1800] Intake/Output this shift: Total I/O In: 240 [P.O.:240] Out: 450 [Urine:450]   Alert and oriented x 3 with mild confusion MAE Strength BUE 5/5, BLE 4/5 Wearing nasal cannula Incision healing well  Assessment/Plan: [name] [phone] s/p T11-T12 decompressive laminectomy. He failed extubation post-operatively and required reintubation. He was extubated 12/24. He uses aCPAP at night.   LOS: 19 days   -CSW awaiting bed at selected SNF. Call was made to see if son would like to select a different SNF. -Continue working with therapies   Val Eagle, DNP, AGNP-C Nurse Practitioner  [facility] [address], [place], [place] [address] P: [phone]    F: [phone]  06/26/2018, 12:28 PM

## 2018-06-26 NOTE — Progress Notes (Signed)
Physical Therapy Treatment Patient Details Name: [name] MRN: [id number] DOB: 05/21/1941 Today's Date: 06/26/2018    History of Present Illness 78 yo male with severe spinal stenosis had T11-T12 decompressive laminectomy 12/20.  Post op developed respiratory failure from pain medications in setting of OSA and required intubation. A fib, RVR; extubated 12/24.Patient status post operative fusion from L1-L5.  OMH: depression; Hepatits; Hepatitis B; HTN; lumbar neuralgia.    PT Comments    Pt was able to stand partially with 2 person assist and was attempted to be seen for transition to chair. However PT and OT recommended him to get OOB with maxi lift, and so set up for nsg to help him to the chair with the lift seat.  Pt is in some pain and so had limited tolerance for progression of sit to stand, but was able to reposition well on the bed to ease back pain.   Follow Up Recommendations  SNF     Equipment Recommendations  None recommended by PT    Recommendations for Other Services       Precautions / Restrictions Precautions Precautions: Back;Fall Precaution Booklet Issued: No Precaution Comments: unable to talk about back precautions Restrictions Weight Bearing Restrictions: No    Mobility  Bed Mobility Overal bed mobility: Needs Assistance Bed Mobility: Supine to Sit;Sit to Supine Rolling: Mod assist;+2 for physical assistance;+2 for safety/equipment Sidelying to sit: Mod assist;+2 for safety/equipment;HOB elevated Supine to sit: Max assist;+2 for physical assistance;+2 for safety/equipment;Mod assist;HOB elevated Sit to supine: Mod assist;+2 for safety/equipment;+2 for physical assistance;HOB elevated Sit to sidelying: Max assist;+2 for safety/equipment General bed mobility comments: used bed rail and cued his safety and sequence  Transfers Overall transfer level: Needs assistance Equipment used: Rolling walker (2 wheeled) Transfers: Sit to/from Stand Sit to Stand:  Max assist;+2 physical assistance;+2 safety/equipment;From elevated surface         General transfer comment: attempted sit - stand x 3 from EOB to RW and pt unable to push through UEs to stand upright in order to transfer  to recliner, but does use LE's about 50% effort  Ambulation/Gait             General Gait Details: unable   Stairs             Wheelchair Mobility    Modified Rankin (Stroke Patients Only)       Balance Overall balance assessment: Needs assistance Sitting-balance support: Feet supported;Bilateral upper extremity supported Sitting balance-Leahy Scale: Poor Sitting balance - Comments: drifts backward at times, not focused   Standing balance support: Bilateral upper extremity supported;During functional activity Standing balance-Leahy Scale: Zero                              Cognition Arousal/Alertness: Awake/alert Behavior During Therapy: Flat affect Overall Cognitive Status: Impaired/Different from baseline Area of Impairment: Attention;Memory;Safety/judgement;Awareness;Problem solving                 Orientation Level: Time;Situation Current Attention Level: Selective Memory: Decreased recall of precautions;Decreased short-term memory Following Commands: Follows one step commands with increased time Safety/Judgement: Decreased awareness of safety;Decreased awareness of deficits Awareness: Intellectual Problem Solving: Slow processing;Decreased initiation;Difficulty sequencing;Requires verbal cues;Requires tactile cues General Comments: instructed and gave tactile cues for all elements of sitting postural control as well as standing      Exercises      General Comments General comments (skin integrity, edema, etc.): O2 sats and  pulses collected with efforts to sit and stand       Pertinent Vitals/Pain Pain Assessment: Faces Faces Pain Scale: Hurts even more Pain Location: low back with positioning to ease  symptoms after mobility  Pain Descriptors / Indicators: Operative site guarding Pain Intervention(s): Limited activity within patient's tolerance;Monitored during session;Premedicated before session;Repositioned    Home Living                      Prior Function            PT Goals (current goals can now be found in the care plan section) Acute Rehab PT Goals Patient Stated Goal: to get home Potential to Achieve Goals: Fair Progress towards PT goals: Progressing toward goals    Frequency    Min 3X/week      PT Plan Current plan remains appropriate    Co-evaluation PT/OT/SLP Co-Evaluation/Treatment: Yes Reason for Co-Treatment: Complexity of the patient's impairments (multi-system involvement);For patient/therapist safety;To address functional/ADL transfers PT goals addressed during session: Mobility/safety with mobility;Balance;Strengthening/ROM;Proper use of DME OT goals addressed during session: ADL's and self-care      AM-PAC PT "6 Clicks" Mobility   Outcome Measure  Help needed turning from your back to your side while in a flat bed without using bedrails?: Total Help needed moving from lying on your back to sitting on the side of a flat bed without using bedrails?: Total Help needed moving to and from a bed to a chair (including a wheelchair)?: Total Help needed standing up from a chair using your arms (e.g., wheelchair or bedside chair)?: Total Help needed to walk in hospital room?: Total Help needed climbing 3-5 steps with a railing? : Total 6 Click Score: 6    End of Session Equipment Utilized During Treatment: Gait belt;Oxygen Activity Tolerance: Patient limited by fatigue;Treatment limited secondary to agitation;Treatment limited secondary to medical complications (Comment) Patient left: in bed;with call bell/phone within reach;with bed alarm set Nurse Communication: Mobility status;Need for lift equipment PT Visit Diagnosis: Unsteadiness on feet  (R26.81);Difficulty in walking, not elsewhere classified (R26.2);Pain Pain - Right/Left: Right Pain - part of body: Leg     Time: 1610-9604 PT Time Calculation (min) (ACUTE ONLY): 29 min  Charges:  $Neuromuscular Re-education: 8-22 mins                     Ivar Drape 06/26/2018, 4:34 PM   Samul Dada, PT MS Acute Rehab Dept. Number: [facility] [id number] and [facility] [phone]

## 2018-06-26 NOTE — Progress Notes (Signed)
Occupational Therapy Treatment Patient Details Name: [name] MRN: [id number] DOB: [birth year] Today's Date: 06/26/2018    History of present illness 78 yo male with severe spinal stenosis had T11-T12 decompressive laminectomy 12/20.  Post op developed respiratory failure from pain medications in setting of OSA and required intubation. A fib, RVR; extubated 12/24.Patient status post operative fusion from L1-L5.  OMH: depression; Hepatits; Hepatitis B; HTN; lumbar neuralgia.   OT comments  Pt making progress slowly with functional goals. Pt sat EOB with max A +2. Pt sate EOB for simple grooming and ADL tasks with min - mod A. Attempted x 3 sit - stand from bed- RW, however pt unable to use UEs to assist wit upright standing. Pt returned to supine in bed with hoyer sling for nursing staff to get OOB with mechanical lift. Pt required total A with peri hygiene after BM at bed level and was able to assist rolling during care. OT will continue to follow acutely  Follow Up Recommendations  SNF;Supervision/Assistance - 24 hour    Equipment Recommendations       Recommendations for Other Services      Precautions / Restrictions Precautions Precautions: Back;Fall Precaution Comments: pt unable to recall back precautions due to cognitive impairments, reviewed with pt Restrictions Weight Bearing Restrictions: No       Mobility Bed Mobility Overal bed mobility: Needs Assistance Bed Mobility: Rolling Rolling: Mod assist;+2 for physical assistance Sidelying to sit: Mod assist;+2 for safety/equipment;HOB elevated Supine to sit: Max assist;+2 for physical assistance Sit to supine: Total assist;+2 for physical assistance Sit to sidelying: Max assist;+2 for safety/equipment    Transfers Overall transfer level: Needs assistance Equipment used: Rolling walker (2 wheeled) Transfers: Sit to/from Stand Sit to Stand: +2 safety/equipment;Max assist         General transfer comment: attempted  sit - stand x 3 from EOB to RW and pt unable to push through UEs to stand upright in order to transfer  to recliner    Balance Overall balance assessment: Needs assistance Sitting-balance support: Feet supported;Bilateral upper extremity supported Sitting balance-Leahy Scale: Fair Sitting balance - Comments: UE support   Standing balance support: Bilateral upper extremity supported;During functional activity Standing balance-Leahy Scale: Zero                             ADL either performed or assessed with clinical judgement   ADL Overall ADL's : Needs assistance/impaired     Grooming: Wash/dry face;Wash/dry hands;Sitting;Minimal assistance Grooming Details (indicate cue type and reason): sitting EOB Upper Body Bathing: Moderate assistance;Sitting Upper Body Bathing Details (indicate cue type and reason): simulated     Upper Body Dressing : Moderate assistance;Sitting           Toileting- Clothing Manipulation and Hygiene: Total assistance Toileting - Clothing Manipulation Details (indicate cue type and reason): pericare after BM in bed with pt rolling left and right             Vision Baseline Vision/History: Wears glasses;Legally blind Wears Glasses: Reading only Patient Visual Report: No change from baseline     Perception     Praxis      Cognition Arousal/Alertness: Awake/alert   Overall Cognitive Status: Impaired/Different from baseline Area of Impairment: Attention;Memory;Safety/judgement;Awareness;Problem solving                 Orientation Level: Situation;Time;Disoriented to   Memory: Decreased recall of precautions;Decreased short-term memory Following Commands: Follows one  step commands inconsistently;Follows one step commands with increased time Safety/Judgement: Decreased awareness of safety;Decreased awareness of deficits   Problem Solving: Slow processing;Decreased initiation;Difficulty sequencing;Requires verbal  cues;Requires tactile cues          Exercises     Shoulder Instructions       General Comments      Pertinent Vitals/ Pain       Pain Assessment: Faces Faces Pain Scale: Hurts even more Pain Location: back during bed mobility Pain Descriptors / Indicators: Aching;Sore;Grimacing;Moaning Pain Intervention(s): Limited activity within patient's tolerance;Monitored during session;Premedicated before session;Repositioned  Home Living                                          Prior Functioning/Environment              Frequency  Min 2X/week        Progress Toward Goals  OT Goals(current goals can now be found in the care plan section)  Progress towards OT goals: Progressing toward goals     Plan Discharge plan remains appropriate    Co-evaluation    PT/OT/SLP Co-Evaluation/Treatment: Yes Reason for Co-Treatment: Complexity of the patient's impairments (multi-system involvement);For patient/therapist safety   OT goals addressed during session: ADL's and self-care      AM-PAC OT "6 Clicks" Daily Activity     Outcome Measure   Help from another person eating meals?: A Little Help from another person taking care of personal grooming?: A Little Help from another person toileting, which includes using toliet, bedpan, or urinal?: Total Help from another person bathing (including washing, rinsing, drying)?: A Lot Help from another person to put on and taking off regular upper body clothing?: A Little Help from another person to put on and taking off regular lower body clothing?: Total 6 Click Score: 13    End of Session Equipment Utilized During Treatment: Oxygen;Rolling walker;Gait belt  OT Visit Diagnosis: Unsteadiness on feet (R26.81);Other abnormalities of gait and mobility (R26.89);Muscle weakness (generalized) (M62.81);Pain;Other symptoms and signs involving cognitive function   Activity Tolerance Patient limited by fatigue;Patient limited  by pain   Patient Left in bed;with call bell/phone within reach;with bed alarm set   Nurse Communication Need for lift equipment        Time: 4270-6237 OT Time Calculation (min): 29 min  Charges: OT General Charges $OT Visit: 1 Visit OT Treatments $Self Care/Home Management : 8-22 mins     Galen Manila 06/26/2018, 1:40 PM

## 2018-06-26 NOTE — Progress Notes (Signed)
  Speech Language Pathology Treatment: Dysphagia  Patient Details Name: [name] MRN: [id number] DOB: Mar 26, 1941 Today's Date: 06/26/2018 Time: 2979-8921 SLP Time Calculation (min) (ACUTE ONLY): 26 min  Assessment / Plan / Recommendation Clinical Impression  Pt completed 10 effortful swallows and repetitions of CTAR given Mod cues for sustained attention. He is restless this afternoon, trying to shift in the bed and asking SLP to remove his mattress. Provided adjustments and repositioning as able. Pt also completed 1 masako, but lost his attention when attempting others. Pt utilized pudding thick liquids during completion of exercises with no overt signs of aspiration, although at one point pt did verbalize that SLP was "gonna make me cough." Education was reiterated to pt/wife about suspected chronic element to dysphagia and increased risk for infection given deconditioning, reduced functional reserve. Recommend continuing current diet - SLP Will continue to follow.   HPI HPI: Pt is a 78 yo male with severe spinal stenosis s/p T11-T12 decompressive laminectomy 12/20. He was extubated post-op, but then developed respiratory failure from pain medications in setting of OSA and required re-intubation 12/20-12/24. PMH: OSA, A fib, Thoracic aortic aneurysm, Diastolic CHF, Arthritis, depression, Hep B.  No new chest imaging since MBSS on 06/18/18      SLP Plan  Continue with current plan of care       Recommendations  Diet recommendations: Dysphagia 2 (fine chop);Pudding-thick liquid Liquids provided via: Teaspoon(1/2 tsp of honey permitted with family) Medication Administration: Crushed with puree Supervision: Staff to assist with self feeding;Full supervision/cueing for compensatory strategies Compensations: Slow rate;Small sips/bites Postural Changes and/or Swallow Maneuvers: Seated upright 90 degrees                Oral Care Recommendations: Oral care BID Follow up  Recommendations: Skilled Nursing facility SLP Visit Diagnosis: Dysphagia, oropharyngeal phase (R13.12) Plan: Continue with current plan of care       GO                Maxcine Ham 06/26/2018, 3:40 PM  Maxcine Ham, M.A. CCC-SLP Acute Herbalist [phone] Office [phone]

## 2018-06-27 NOTE — Progress Notes (Addendum)
Nutrition Follow-up  DOCUMENTATION CODES:   Morbid obesity  INTERVENTION:    Magic cup TID with meals, each supplement provides 290 kcal and 9 grams of protein  NUTRITION DIAGNOSIS:   Increased nutrient needs related to chronic illness as evidenced by estimated needs, ongoing  GOAL:   Patient will meet greater than or equal to 90% of their needs, progressing  MONITOR:   PO intake, Supplement acceptance, Labs, Skin, Weight trends  ASSESSMENT:   Pt with PMH of HTN, Depression, morbid obesity, OSA, Afib, HF and numerous prior spinal surgeries. Has had increasing sensory loss + pain in BLE r/t  spinal stenosis s/p decompression 12/20. Extubated postop, but developed Afib and Acute resp failure necessitating reintubation.    Pt continues to have on and off confusion. Spoke with RN; pt eats well with feeding assistance. Average PO intake is 50% per flowsheet records.  Speech Path continues to follow for dysphagia treatments. Taking some of his Magic Cup dessert supplements. Disposition: [facility].  Diet Order:   Diet Order            DIET DYS 2 Room service appropriate? Yes; Fluid consistency: Pudding Thick  Diet effective now             EDUCATION NEEDS:   Not appropriate for education at this time  Skin:  Skin Assessment: Skin Integrity Issues: Skin Integrity Issues:: Other (Comment) Other: Cellulitis R leg, MASD to groin, Surgical incision to back   Last BM:  1/9  Height:   Ht Readings from Last 1 Encounters:  06/11/18 5\' 8"  (1.727 m)   Weight:   Wt Readings from Last 1 Encounters:  06/27/18 (!) 140.8 kg   Ideal Body Weight:  70 kg  BMI:  Body mass index is 47.2 kg/m.  Estimated Nutritional Needs:   Kcal:  1800-2000  Protein:  90-105 gm  Fluid:  1.8-2.0 L  Maureen Chatters, RD, LDN Pager #: [phone] After-Hours Pager #: [phone]

## 2018-06-27 NOTE — Progress Notes (Signed)
   Providing Compassionate, Quality Care - Together   Subjective: Patient reports back pain this morning. No issues overnight.  Objective: Vital signs in last 24 hours: Temp:  [97.8 F (36.6 C)-100.9 F (38.3 C)] 98.1 F (36.7 C) (01/09 0917) Pulse Rate:  [84-108] 108 (01/09 0917) Resp:  [19-20] 19 (01/09 0917) BP: (108-151)/(51-124) 151/124 (01/09 0917) SpO2:  [92 %-99 %] 96 % (01/09 0917) Weight:  [140.8 kg] 140.8 kg (01/09 0639)  Intake/Output from previous day: 01/08 0701 - 01/09 0700 In: 480 [P.O.:480] Out: 1250 [Urine:1250] Intake/Output this shift: Total I/O In: 100 [P.O.:100] Out: 450 [Urine:450]  Alert and oriented x 3 MAE Strength BUE 5/5, BLE 4/5 Wearing nasal cannula 4 L Incision healing well, OTA  Assessment/Plan: [name] [id number] s/p T11-T12 decompressive laminectomy. He failed extubation post-operatively and required reintubation. He was extubated 12/24. He uses aCPAP at night. Requiring nasal cannula at present.   LOS: 20 days    -CSW awaiting bed at selected SNF. -Continue working with therapies   Val Eagle, DNP, AGNP-C Nurse Practitioner  [facility] [address], [place], [place] [address] P: [phone]    F: [phone]  06/27/2018, 10:52 AM

## 2018-06-27 NOTE — Care Management Important Message (Signed)
Important Message  Patient Details  Name: [name] MRN: [id number] Date of Birth: May 06, 1941   Medicare Important Message Given:  Yes    Rockie Schnoor P Abdulah Iqbal 06/27/2018, 4:29 PM

## 2018-06-27 NOTE — Progress Notes (Signed)
Patient will gp to [facility] once insurance authorization is received. Authorization currently remains pending.  Antony Blackbird, [facility] Clinical Social Worker [phone]

## 2018-06-27 NOTE — Progress Notes (Signed)
Patient refusing CPAP.

## 2018-06-28 MED ORDER — CYCLOBENZAPRINE HCL 10 MG PO TABS: 10.0000 mg | ORAL_TABLET | Freq: Three times a day (TID) | ORAL | 0 refills | Status: AC | PRN

## 2018-06-28 MED ORDER — DILTIAZEM HCL 60 MG PO TABS: 60.0000 mg | ORAL_TABLET | Freq: Four times a day (QID) | ORAL | 0 refills | Status: AC

## 2018-06-28 MED ORDER — OXYCODONE HCL 5 MG PO TABS: 5.0000 mg | ORAL_TABLET | ORAL | 0 refills | Status: AC | PRN

## 2018-06-28 MED ORDER — METOPROLOL TARTRATE 50 MG PO TABS: 50.0000 mg | ORAL_TABLET | Freq: Two times a day (BID) | ORAL | 0 refills | Status: AC

## 2018-06-28 NOTE — Discharge Summary (Signed)
Physician Discharge Summary  Patient ID: [name] MRN: [id number] DOB/AGE: [birth year] 78 y.o.  Admit date: 06/07/2018 Discharge date: 06/28/2018  Admission Diagnoses:   Discharge Diagnoses:  Principal Problem:   Acute respiratory failure with hypoxia and hypercapnia (HCC) Active Problems:   Morbid obesity (HCC)   OSA (obstructive sleep apnea)   Spinal stenosis, thoracic   A-fib (HCC)   BPH (benign prostatic hyperplasia)   Discharged Condition: fair  Hospital Course: [name] was admitted on 06/07/2018 to undergo a T11-T12 decompressive laminectomy. He was in a poor state of health prior to surgery, with an extensive history of prior spinal surgeries. He failed extubation post-operatively and required reintubation. He was extubated on 06/11/2018. He had a complicated course following extubation, including converting into A-fib with RVR and dysphagia. He has had difficulty working with therapies while admitted and is requiring admission to a SNF.  Consults: cardiology, pulmonary/intensive care and rehabilitation medicine  Significant Diagnostic Studies: Modified Barium Swallow Study  Treatments: respiratory therapy: CPAP and surgery: T11-T12 decompressive laminectomy  Discharge Exam: Blood pressure 126/62, pulse (!) 105, temperature 99.3 F (37.4 C), temperature source Axillary, resp. rate 18, height 5\' 8"  (1.727 m), weight 135.6 kg, SpO2 96 %.   Alert and oriented x 3 MAE Strength BUE 5/5, BLE 4/5 Wearing nasal cannula 4 L Incision healing well, OTA  Disposition: Discharge disposition: 03-Skilled Nursing Facility        Allergies as of 06/28/2018      Reactions   Ibuprofen Hives, Rash   Gabapentin Other (See Comments)   "makes me loopy"      Medication List    STOP taking these medications   HYDROcodone-acetaminophen 10-325 MG tablet Commonly known as:  NORCO     TAKE these medications   acetaminophen 650 MG CR tablet Commonly known as:   TYLENOL Take 1,300 mg by mouth every 8 (eight) hours as needed for pain.   aspirin EC 81 MG tablet Take 81 mg by mouth daily.   atorvastatin 20 MG tablet Commonly known as:  LIPITOR Take 20 mg by mouth daily.   celecoxib 200 MG capsule Commonly known as:  CELEBREX Take 200 mg by mouth 2 (two) times daily.   cyclobenzaprine 10 MG tablet Commonly known as:  FLEXERIL Take 1 tablet (10 mg total) by mouth 3 (three) times daily as needed for muscle spasms.   diltiazem 60 MG tablet Commonly known as:  CARDIZEM Take 1 tablet (60 mg total) by mouth every 6 (six) hours.   DULoxetine 60 MG capsule Commonly known as:  CYMBALTA Take 60 mg by mouth daily.   DULoxetine 30 MG capsule Commonly known as:  CYMBALTA Take 30 mg by mouth daily.   furosemide 20 MG tablet Commonly known as:  LASIX Take 20 mg by mouth daily.   losartan 50 MG tablet Commonly known as:  COZAAR Take 50 mg by mouth daily.   metoprolol tartrate 50 MG tablet Commonly known as:  LOPRESSOR Take 1 tablet (50 mg total) by mouth 2 (two) times daily.   mirtazapine 15 MG tablet Commonly known as:  REMERON Take 15 mg by mouth at bedtime.   oxyCODONE 5 MG immediate release tablet Commonly known as:  Oxy IR/ROXICODONE Take 1 tablet (5 mg total) by mouth every 4 (four) hours as needed for severe pain.   PRESERVISION AREDS 2 PO Take 1 capsule by mouth 2 (two) times daily.   tamsulosin 0.4 MG Caps capsule Commonly known as:  FLOMAX Take 0.4  mg by mouth daily.   tobramycin 0.3 % ophthalmic solution Commonly known as:  TOBREX Place 1 drop into the right eye See admin instructions. Place 1 drop in right eye 3 times daily the day before, the day of and the day after eye shots   Vitamin D3 125 MCG (5000 UT) Caps Take 5,000 Units by mouth daily.      Follow-up Information    Julio Sicks, MD. Schedule an appointment as soon as possible for a visit in 6 week(s).   Specialty:  Neurosurgery Why:  Call to make an  appointment Contact information: [address] [place] [address] [phone]           Signed: Floreen Comber 06/28/2018, 12:57 PM

## 2018-06-28 NOTE — Progress Notes (Signed)
Physical Therapy Treatment Patient Details Name: [name] MRN: [id number] DOB: Feb 24, 1941 Today's Date: 06/28/2018    History of Present Illness 78 yo male with severe spinal stenosis had T11-T12 decompressive laminectomy 12/20.  Post op developed respiratory failure from pain medications in setting of OSA and required intubation. A fib, RVR; extubated 12/24.Patient status post operative fusion from L1-L5.  OMH: depression; Hepatits; Hepatitis B; HTN; lumbar neuralgia.    PT Comments    Pt was in bed with cloth on his head, and was able to discuss the need to be up in the chair with PT.  He is able to tolerate rolling with help, able to position in chair such that he can sit comfortably.  Pt is motivated to work on these skills, and will continue to work on transition to standing as he increases his endurance with these activities.  Follow acutely to increase magnitude of progress in SNF for home.   Follow Up Recommendations  SNF     Equipment Recommendations  None recommended by PT    Recommendations for Other Services       Precautions / Restrictions Precautions Precautions: Back;Fall Precaution Booklet Issued: No Precaution Comments: pt does not give recall on back precautions Restrictions Weight Bearing Restrictions: No    Mobility  Bed Mobility Overal bed mobility: Needs Assistance Bed Mobility: Rolling Rolling: Mod assist;+2 for physical assistance;+2 for safety/equipment         General bed mobility comments: bed rail and cued log roll  Transfers Overall transfer level: Needs assistance Equipment used: (maxi lift)             General transfer comment: pt was assisted by supporting him by the legs, and with upright on the lift was very much more comfortable  Ambulation/Gait                 Stairs             Wheelchair Mobility    Modified Rankin (Stroke Patients Only)       Balance Overall balance assessment: Needs  assistance Sitting-balance support: Feet supported;Bilateral upper extremity supported Sitting balance-Leahy Scale: Poor Sitting balance - Comments: sitting in chair is safe                                    Cognition Arousal/Alertness: Awake/alert Behavior During Therapy: Anxious;WFL for tasks assessed/performed Overall Cognitive Status: Impaired/Different from baseline Area of Impairment: Attention;Memory;Following commands;Safety/judgement;Awareness;Problem solving                 Orientation Level: Situation Current Attention Level: Sustained Memory: Decreased recall of precautions;Decreased short-term memory Following Commands: Follows one step commands inconsistently;Follows one step commands with increased time Safety/Judgement: Decreased awareness of safety;Decreased awareness of deficits Awareness: Intellectual Problem Solving: Slow processing;Decreased initiation;Difficulty sequencing;Requires verbal cues;Requires tactile cues General Comments: follows instructions best with repetition of the plan for movement      Exercises General Exercises - Lower Extremity Ankle Circles/Pumps: AROM;AAROM;Both;5 reps Hip ABduction/ADduction: AAROM;AROM;Both;5 reps    General Comments General comments (skin integrity, edema, etc.): vitals were stable with the transition      Pertinent Vitals/Pain Pain Assessment: Faces Faces Pain Scale: Hurts little more Pain Location: low back to roll setting up to lift Pain Descriptors / Indicators: Operative site guarding Pain Intervention(s): Monitored during session;Premedicated before session    Home Living  Prior Function            PT Goals (current goals can now be found in the care plan section) Acute Rehab PT Goals Patient Stated Goal: to get home Progress towards PT goals: Progressing toward goals    Frequency    Min 3X/week      PT Plan Current plan remains  appropriate    Co-evaluation              AM-PAC PT "6 Clicks" Mobility   Outcome Measure  Help needed turning from your back to your side while in a flat bed without using bedrails?: A Lot Help needed moving from lying on your back to sitting on the side of a flat bed without using bedrails?: A Lot Help needed moving to and from a bed to a chair (including a wheelchair)?: Total Help needed standing up from a chair using your arms (e.g., wheelchair or bedside chair)?: Total Help needed to walk in hospital room?: Total Help needed climbing 3-5 steps with a railing? : Total 6 Click Score: 8    End of Session Equipment Utilized During Treatment: Oxygen Activity Tolerance: Patient limited by fatigue;Treatment limited secondary to medical complications (Comment) Patient left: in chair;with call bell/phone within reach;with chair alarm set;with nursing/sitter in room Nurse Communication: Mobility status;Other (comment)(plan for sitting OOB) PT Visit Diagnosis: Unsteadiness on feet (R26.81);Difficulty in walking, not elsewhere classified (R26.2);Pain Pain - Right/Left: Right Pain - part of body: Leg     Time: 1833-5825 PT Time Calculation (min) (ACUTE ONLY): 28 min  Charges:  $Therapeutic Exercise: 8-22 mins $Therapeutic Activity: 8-22 mins                    Ivar Drape 06/28/2018, 10:46 AM  Samul Dada, PT MS Acute Rehab Dept. Number: [facility] [id number] and [facility] [phone]

## 2018-06-28 NOTE — Progress Notes (Signed)
  Speech Language Pathology Treatment: Dysphagia  Patient Details Name: [name] MRN: [id number] DOB: 01-24-1941 Today's Date: 06/28/2018 Time: 4098-1191 SLP Time Calculation (min) (ACUTE ONLY): 13 min  Assessment / Plan / Recommendation Clinical Impression  Pt performed pharyngeal exercises (effortful swallow, CTAR) with Mod cues, and consumed approximately 3 ounces of applesauce without overt signs of aspiration. Several spoonfuls of pudding-thick water were also provided without signs of intolerance. Will continue to follow and progress as able.   HPI HPI: Pt is a 78 yo male with severe spinal stenosis s/p T11-T12 decompressive laminectomy 12/20. He was extubated post-op, but then developed respiratory failure from pain medications in setting of OSA and required re-intubation 12/20-12/24. PMH: OSA, A fib, Thoracic aortic aneurysm, Diastolic CHF, Arthritis, depression, Hep B.  No new chest imaging since MBSS on 06/18/18      SLP Plan  Continue with current plan of care       Recommendations  Diet recommendations: Dysphagia 2 (fine chop);Pudding-thick liquid Liquids provided via: (1/2 tsp honey thick liquids permitted with family) Medication Administration: Crushed with puree Supervision: Staff to assist with self feeding;Full supervision/cueing for compensatory strategies Compensations: Slow rate;Small sips/bites Postural Changes and/or Swallow Maneuvers: Seated upright 90 degrees                Oral Care Recommendations: Oral care BID Follow up Recommendations: Skilled Nursing facility SLP Visit Diagnosis: Dysphagia, oropharyngeal phase (R13.12) Plan: Continue with current plan of care       GO                Maxcine Ham 06/28/2018, 2:18 PM  Maxcine Ham, M.A. CCC-SLP Acute Herbalist [phone] Office [phone]

## 2018-06-28 NOTE — Clinical Social Work Placement (Signed)
   CLINICAL SOCIAL WORK PLACEMENT  NOTE  Date:  06/28/2018  Patient Details  Name: [name] MRN: [id number] Date of Birth: 1940-08-01  Clinical Social Work is seeking post-discharge placement for this patient at the Skilled  Nursing Facility level of care (*CSW will initial, date and re-position this form in  chart as items are completed):      Patient/family provided with [facility] Clinical Social Work Department's list of facilities offering this level of care within the geographic area requested by the patient (or if unable, by the patient's family).      Patient/family informed of their freedom to choose among providers that offer the needed level of care, that participate in Medicare, Medicaid or managed care program needed by the patient, have an available bed and are willing to accept the patient.      Patient/family informed of [facility] ownership interest in [facility] and [facility], as well as of the fact that they are under no obligation to receive care at these facilities.  PASRR submitted to EDS on 06/15/18     PASRR number received on       Existing PASRR number confirmed on 06/15/18     FL2 transmitted to all facilities in geographic area requested by pt/family on 06/15/18     FL2 transmitted to all facilities within larger geographic area on       Patient informed that his/her managed care company has contracts with or will negotiate with certain facilities, including the following:            Patient/family informed of bed offers received.  Patient chooses bed at [facility]     Physician recommends and patient chooses bed at      Patient to be transferred to [facility] on 06/28/18.  Patient to be transferred to facility by PTAR     Patient family notified on 06/28/18 of transfer.  Name of family member notified:  Crescencio Marku, son     PHYSICIAN       Additional Comment:     _______________________________________________ Eduard Roux, LCSWA 06/28/2018, 1:30 PM

## 2018-06-28 NOTE — Progress Notes (Signed)
Received call from Antony Blackbird, CSW that insurance authorization was received for pt to go to [facility] today.  Called attending, Julio Sicks, MD's office and left message with his secretary Morrie Sheldon) that insurance authorization was received.  Awaiting to hear back from Dr. Jordan Likes regarding discharge orders ans was informed by his secretary Morrie Sheldon that he was in surgery.

## 2018-06-28 NOTE — Progress Notes (Signed)
PTAR arrived to transport patient to [facility].  Vital signs obtained and were stable.  CCMD notified.  Nurse Joni Reining at [facility] had been prior notified and given report.  Midline IV removed.  AVS given to and explained to patient's family.  All questions addressed.

## 2018-06-28 NOTE — Progress Notes (Signed)
Patient will DC [id number] at [facility]  DC Date: 06/28/2018 Family Notified: Amada Jupiter, son Transport LE:XNTZ  RN, patient, and facility notified of DC. Discharge Summary sent to facility. RN given number for [phone]. DC packet on chart. Ambulance transport requested for patient.   Clinical Social Worker signing off.  Antony Blackbird, [facility] Clinical Social Worker [phone]

## 2018-06-28 NOTE — Progress Notes (Signed)
Report called to [facility] and spoke to nurse Joni Reining.  Vital signs stable, IV removed and PTAR called.

## 2018-07-20 DEATH — deceased
